# Patient Record
Sex: Male | Born: 1940 | Race: White | Hispanic: No | Smoking: Former smoker
Health system: Southern US, Community
[De-identification: ages and names within clinical notes are randomized; demographics above are authoritative.]

## PROBLEM LIST (undated history)

## (undated) DIAGNOSIS — K635 Polyp of colon: Secondary | ICD-10-CM

## (undated) DIAGNOSIS — H9190 Unspecified hearing loss, unspecified ear: Secondary | ICD-10-CM

## (undated) DIAGNOSIS — E119 Type 2 diabetes mellitus without complications: Secondary | ICD-10-CM

## (undated) DIAGNOSIS — C801 Malignant (primary) neoplasm, unspecified: Secondary | ICD-10-CM

## (undated) HISTORY — DX: Malignant (primary) neoplasm, unspecified: C80.1

## (undated) HISTORY — DX: Type 2 diabetes mellitus without complications: E11.9

## (undated) HISTORY — PX: CATARACT EXTRACTION: SUR2

## (undated) HISTORY — PX: NO PAST SURGERIES: SHX2092

## (undated) HISTORY — DX: Polyp of colon: K63.5

## (undated) HISTORY — DX: Unspecified hearing loss, unspecified ear: H91.90

---

## 2011-10-29 HISTORY — PX: COLONOSCOPY: SHX174

## 2015-02-22 ENCOUNTER — Other Ambulatory Visit: Payer: Self-pay | Admitting: General Surgery

## 2015-02-22 ENCOUNTER — Encounter: Payer: Self-pay | Admitting: General Surgery

## 2015-02-22 ENCOUNTER — Ambulatory Visit (INDEPENDENT_AMBULATORY_CARE_PROVIDER_SITE_OTHER): Payer: Medicare Other | Admitting: General Surgery

## 2015-02-22 VITALS — BP 132/62 | HR 80 | Resp 16 | Ht 72.0 in | Wt 182.0 lb

## 2015-02-22 DIAGNOSIS — C20 Malignant neoplasm of rectum: Secondary | ICD-10-CM

## 2015-02-22 NOTE — Patient Instructions (Addendum)
PET scan  The patient is aware to call back for any questions or concerns.  Implanted [facility] Guide An implanted port is a type of central line that is placed under the skin. Central lines are used to provide IV access when treatment or nutrition needs to be given through a person's veins. Implanted ports are used for long-term IV access. An implanted port may be placed because:   You need IV medicine that would be irritating to the small veins in your hands or arms.   You need long-term IV medicines, such as antibiotics.   You need IV nutrition for a long period.   You need frequent blood draws for lab tests.   You need dialysis.  Implanted ports are usually placed in the chest area, but they can also be placed in the upper arm, the abdomen, or the leg. An implanted port has two main parts:   Reservoir. The reservoir is round and will appear as a small, raised area under your skin. The reservoir is the part where a needle is inserted to give medicines or draw blood.   Catheter. The catheter is a thin, flexible tube that extends from the reservoir. The catheter is placed into a large vein. Medicine that is inserted into the reservoir goes into the catheter and then into the vein.  HOW WILL I CARE FOR MY INCISION SITE? Do not get the incision site wet. Bathe or shower as directed by your health care provider.  HOW IS MY PORT ACCESSED? Special steps must be taken to access the port:   Before the port is accessed, a numbing cream can be placed on the skin. This helps numb the skin over the port site.   Your health care provider uses a sterile technique to access the port.  Your health care provider must put on a mask and sterile gloves.  The skin over your port is cleaned carefully with an antiseptic and allowed to dry.  The port is gently pinched between sterile gloves, and a needle is inserted into the port.  Only "non-coring" port needles should be used to access the  port. Once the port is accessed, a blood return should be checked. This helps ensure that the port is in the vein and is not clogged.   If your port needs to remain accessed for a constant infusion, a clear (transparent) bandage will be placed over the needle site. The bandage and needle will need to be changed every week, or as directed by your health care provider.   Keep the bandage covering the needle clean and dry. Do not get it wet. Follow your health care provider's instructions on how to take a shower or bath while the port is accessed.   If your port does not need to stay accessed, no bandage is needed over the port.  WHAT IS FLUSHING? Flushing helps keep the port from getting clogged. Follow your health care provider's instructions on how and when to flush the port. Ports are usually flushed with saline solution or a medicine called heparin. The need for flushing will depend on how the port is used.   If the port is used for intermittent medicines or blood draws, the port will need to be flushed:   After medicines have been given.   After blood has been drawn.   As part of routine maintenance.   If a constant infusion is running, the port may not need to be flushed.  HOW LONG WILL  MY PORT STAY IMPLANTED? The port can stay in for as long as your health care provider thinks it is needed. When it is time for the port to come out, surgery will be done to remove it. The procedure is similar to the one performed when the port was put in.  WHEN SHOULD I SEEK IMMEDIATE MEDICAL CARE? When you have an implanted port, you should seek immediate medical care if:   You notice a bad smell coming from the incision site.   You have swelling, redness, or drainage at the incision site.   You have more swelling or pain at the port site or the surrounding area.   You have a fever that is not controlled with medicine. Document Released: 10/14/2005 Document Revised: 08/04/2013 Document  Reviewed: 06/21/2013 [facility] Patient Information 2015 [place], [place]. This information is not intended to replace advice given to you by your health care provider. Make sure you discuss any questions you have with your health care provider.  Patient has been scheduled for a PET scan at [facility] for 02-27-15 at 10:30 am (arrive 10:15 am). Prep instructions were reviewed with the patient.   This patient will be scheduled for an appointment to see medical oncology and Dr. Baruch Gouty at the [facility].

## 2015-02-22 NOTE — Progress Notes (Signed)
Patient ID: Gary Hampton, male   DOB: 1941/09/03, 74 y.o.   MRN: [id number]  Chief Complaint  Patient presents with  . Rectal Bleeding    HPI Gary Hampton is a 74 y.o. male.  Here today for evaluation of rectal bleeding and diarrhea. He states it has gotten worse over the past 6 months. He states he can't control his bowel as well. His bowel use to move every other day but now its several times a day. Described as pudding consistency. He noticed occasional bleeding with his BM over the past several months. He does have hemorrhoids. He has used suppositories but didn't help. He has seen bleeding without a BM.  Denies family history of colon cancer.  When he had his colonoscopy 3 years ago he was having diarrhea described as  "watery". At that time he was told that there was a rectal mass that could be cancer, but he declined evaluation as his diarrhea resolved after the colonoscopy.  Recent weight loss about 20 pounds. Denies abdominal pain.  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   HPI  Past Medical History  Diagnosis Date  . Diabetes mellitus without complication   . Colon polyp   . Hard of hearing     Past Surgical History  Procedure Laterality Date  . Colonoscopy  2013    Dr. Dionne Milo    No family history on file.  Social History History  Substance Use Topics  . Smoking status: Former Smoker -- 3 years    Quit date: 10/28/1978  . Smokeless tobacco: Never Used  . Alcohol Use: 0.0 oz/week    0 Standard drinks or equivalent per week     Comment: 3/day    No Known Allergies  Current Outpatient Prescriptions  Medication Sig Dispense Refill  . glimepiride (AMARYL) 2 MG tablet Take 2 mg by mouth daily with breakfast.     No current facility-administered medications for this visit.    Review of Systems Review of Systems   Constitutional: Negative.   Respiratory: Negative.   Cardiovascular: Negative.   Gastrointestinal: Positive for diarrhea and blood in stool.    Blood pressure 132/62, pulse 80, resp. rate 16, height 6' (1.829 m), weight 182 lb (82.555 kg).  Physical Exam Physical Exam  Constitutional: He is oriented to person, place, and time. He appears well-developed and well-nourished.  Neck: Neck supple.  Cardiovascular: Normal rate, regular rhythm, normal heart sounds and intact distal pulses.   Pulses:      Femoral pulses are 2+ on the right side, and 2+ on the left side.      Dorsalis pedis pulses are 2+ on the right side, and 2+ on the left side.  Pulmonary/Chest: Effort normal and breath sounds normal.  Abdominal: Soft. Normal appearance and bowel sounds are normal. There is no tenderness.  Genitourinary:     Lymphadenopathy:    He has no cervical adenopathy.       Left cervical: No superficial cervical adenopathy present.       Right: No inguinal and no supraclavicular adenopathy present.       Left: No inguinal and no supraclavicular adenopathy present.  Neurological: He is alert and oriented to person, place, and time.  Skin: Skin is warm and dry.    Data Reviewed Laboratory studies dated 02/17/2015 completed by his primary care physician showed a hemoglobin  of 17.9 with an MCV of 94, white blood cell count of 8600. Nonfasting blood sugar 345. Creatinine 0.76. Estimated GFR 90. Hemoglobin A1c 10.6. CEA 29.0.  Colonoscopy report dated 02/11/2012 oriented and normal perianal rectal exam. Polyps were reported in the cecum, ileocecal valve, ascending colon, transverse colon and descending colon. A fungating nonobstructing medium-sized mass was identified in the distal rectum. This was described as being non-circumferential. Biopsy reports from this procedure are not available at this time.  PCP notes of 02/17/2015 reviewed. 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  Location of this lesion is not noted, and formal pathology needs to be reviewed.  Assessment    Rectal mass with bleeding, no anemia, previous clinical impression of rectal cancer.  Elevated CEA consistent with rectal cancer.  Poorly controlled diabetes.  Weight loss.    Plan    The patient's clinical exam is consistent with rectal cancer. Attempts will be made to obtain the biopsy results from his 2013 endoscopy. Based on his elevated CEA and clinical exam, a PET/CT is indicated to assess for distant disease, strongly suspected with his elevated CEA.  The patient was advised that chemotherapy and radiation as first line treatment as part of multimodal therapy. He is likely incontinent due to the mass protruding through the rectum. If metastatic disease is not identified, he may be a candidate for a diverting colostomy to minimize his discomfort with rectal seepage during neoadjuvant therapy.    PET scan.  Appointment with [place].  The role for central venous access for chemotherapy for rectal cancer was discussed. The risks associated with central venous access including arterial, pulmonary and venous injury were reviewed. The possible need for additional treatment if pulmonary injury occurs (chest tube placement) was discussed.  Patient has been scheduled for a PET scan at [facility] for 02-27-15 at 10:30 am (arrive 10:15 am). Prep instructions were reviewed with the patient.   This patient will be scheduled for an appointment to see medical oncology and Dr. Baruch Gouty at the [facility]   PCP:  Irven Easterly 02/22/2015, 7:50 PM

## 2015-02-23 ENCOUNTER — Telehealth: Payer: Self-pay | Admitting: *Deleted

## 2015-02-23 NOTE — Telephone Encounter (Signed)
Patient has been scheduled for an appointment with Dr. Ma Hillock and Dr. Baruch Gouty for 03-03-15 at 9:30 am. Message left on patient's cell number with information per his request.

## 2015-02-27 ENCOUNTER — Ambulatory Visit
Admission: RE | Admit: 2015-02-27 | Discharge: 2015-02-27 | Disposition: A | Discharge status: Home / Self Care | Payer: Medicare Other | Source: Ambulatory Visit | Attending: General Surgery | Admitting: General Surgery

## 2015-02-27 ENCOUNTER — Telehealth: Payer: Self-pay

## 2015-02-27 ENCOUNTER — Telehealth: Payer: Self-pay | Admitting: *Deleted

## 2015-02-27 DIAGNOSIS — Z87891 Personal history of nicotine dependence: Secondary | ICD-10-CM | POA: Diagnosis not present

## 2015-02-27 DIAGNOSIS — R634 Abnormal weight loss: Secondary | ICD-10-CM | POA: Diagnosis not present

## 2015-02-27 DIAGNOSIS — H9190 Unspecified hearing loss, unspecified ear: Secondary | ICD-10-CM | POA: Diagnosis not present

## 2015-02-27 DIAGNOSIS — R197 Diarrhea, unspecified: Secondary | ICD-10-CM | POA: Diagnosis not present

## 2015-02-27 DIAGNOSIS — K625 Hemorrhage of anus and rectum: Secondary | ICD-10-CM | POA: Diagnosis present

## 2015-02-27 DIAGNOSIS — Z79899 Other long term (current) drug therapy: Secondary | ICD-10-CM | POA: Diagnosis not present

## 2015-02-27 DIAGNOSIS — K649 Unspecified hemorrhoids: Secondary | ICD-10-CM | POA: Diagnosis not present

## 2015-02-27 DIAGNOSIS — E119 Type 2 diabetes mellitus without complications: Secondary | ICD-10-CM | POA: Diagnosis not present

## 2015-02-27 DIAGNOSIS — R970 Elevated carcinoembryonic antigen [CEA]: Secondary | ICD-10-CM | POA: Diagnosis not present

## 2015-02-27 DIAGNOSIS — C20 Malignant neoplasm of rectum: Secondary | ICD-10-CM

## 2015-02-27 DIAGNOSIS — D490 Neoplasm of unspecified behavior of digestive system: Secondary | ICD-10-CM | POA: Diagnosis not present

## 2015-02-27 DIAGNOSIS — Z86010 Personal history of colonic polyps: Secondary | ICD-10-CM | POA: Diagnosis not present

## 2015-02-27 LAB — GLUCOSE, CAPILLARY
Comment 1: HIGH
Glucose-Capillary: 284 mg/dL — ABNORMAL HIGH (ref 70–99)
Glucose-Capillary: 305 mg/dL — ABNORMAL HIGH (ref 70–99)

## 2015-02-27 NOTE — Telephone Encounter (Signed)
Suzann called with PET/CT at [facility]. The patient was there today for his PET scan but his blood sugar was 284, and then 305 on check. He sees Dr Benita Stabile for this and was seen 3 weeks ago. He did follow the protocol for the test as instructed. He will need to have his blood sugar under control and be seen by Dr Hall Busing for this. His PET scan will be rescheduled once this is done. The patient will call the office once he has seen Dr Hall Busing and his blood sugar is under control.

## 2015-02-27 NOTE — Telephone Encounter (Signed)
-----   Message from Robert Bellow, MD sent at 02/27/2015 10:31 AM EDT ----- Please reschedule the patient's PET scan. We'll planned would be attempt to observation the day prior for diabetic glucose control.

## 2015-02-27 NOTE — Telephone Encounter (Signed)
Patient's PET scan has been rescheduled for 03-03-15 at [facility] for 7:30 am (arrive 7 am). This patient is aware of prep instructions.   Patient will be admitted to [facility] for observation on 03-02-15 for diabetic glucose control.

## 2015-03-01 ENCOUNTER — Other Ambulatory Visit: Payer: Self-pay | Admitting: General Surgery

## 2015-03-01 DIAGNOSIS — IMO0002 Reserved for concepts with insufficient information to code with codable children: Secondary | ICD-10-CM | POA: Insufficient documentation

## 2015-03-01 DIAGNOSIS — E1165 Type 2 diabetes mellitus with hyperglycemia: Secondary | ICD-10-CM | POA: Insufficient documentation

## 2015-03-02 ENCOUNTER — Ambulatory Visit
Admission: AD | Admit: 2015-03-02 | Discharge: 2015-03-03 | Disposition: A | Discharge status: Home / Self Care | Payer: Medicare Other | Source: Ambulatory Visit | Attending: General Surgery | Admitting: General Surgery

## 2015-03-02 DIAGNOSIS — Z79899 Other long term (current) drug therapy: Secondary | ICD-10-CM | POA: Insufficient documentation

## 2015-03-02 DIAGNOSIS — R7309 Other abnormal glucose: Secondary | ICD-10-CM | POA: Diagnosis present

## 2015-03-02 DIAGNOSIS — H9190 Unspecified hearing loss, unspecified ear: Secondary | ICD-10-CM | POA: Insufficient documentation

## 2015-03-02 DIAGNOSIS — K625 Hemorrhage of anus and rectum: Secondary | ICD-10-CM | POA: Diagnosis not present

## 2015-03-02 DIAGNOSIS — R634 Abnormal weight loss: Secondary | ICD-10-CM | POA: Insufficient documentation

## 2015-03-02 DIAGNOSIS — D490 Neoplasm of unspecified behavior of digestive system: Secondary | ICD-10-CM | POA: Insufficient documentation

## 2015-03-02 DIAGNOSIS — Z86010 Personal history of colonic polyps: Secondary | ICD-10-CM | POA: Insufficient documentation

## 2015-03-02 DIAGNOSIS — K649 Unspecified hemorrhoids: Secondary | ICD-10-CM | POA: Insufficient documentation

## 2015-03-02 DIAGNOSIS — R970 Elevated carcinoembryonic antigen [CEA]: Secondary | ICD-10-CM | POA: Insufficient documentation

## 2015-03-02 DIAGNOSIS — E1165 Type 2 diabetes mellitus with hyperglycemia: Secondary | ICD-10-CM

## 2015-03-02 DIAGNOSIS — C189 Malignant neoplasm of colon, unspecified: Secondary | ICD-10-CM

## 2015-03-02 DIAGNOSIS — E119 Type 2 diabetes mellitus without complications: Secondary | ICD-10-CM | POA: Insufficient documentation

## 2015-03-02 DIAGNOSIS — Z87891 Personal history of nicotine dependence: Secondary | ICD-10-CM | POA: Insufficient documentation

## 2015-03-02 DIAGNOSIS — C20 Malignant neoplasm of rectum: Secondary | ICD-10-CM

## 2015-03-02 DIAGNOSIS — R197 Diarrhea, unspecified: Secondary | ICD-10-CM | POA: Insufficient documentation

## 2015-03-02 DIAGNOSIS — O24911 Unspecified diabetes mellitus in pregnancy, first trimester: Secondary | ICD-10-CM | POA: Diagnosis present

## 2015-03-02 LAB — GLUCOSE, CAPILLARY
GLUCOSE-CAPILLARY: 225 mg/dL — AB (ref 70–99)
Glucose-Capillary: 300 mg/dL — ABNORMAL HIGH (ref 70–99)
Glucose-Capillary: 250 mg/dL — ABNORMAL HIGH (ref 70–99)

## 2015-03-02 MED ORDER — SODIUM CHLORIDE 0.45 % IV SOLN
INTRAVENOUS | Status: DC
  Administered 2015-03-02 – 2015-03-03 (×3): via INTRAVENOUS

## 2015-03-02 MED ORDER — ALUM & MAG HYDROXIDE-SIMETH 200-200-20 MG/5ML PO SUSP: 30.0000 mL | Freq: Four times a day (QID) | ORAL | Status: DC | PRN

## 2015-03-02 MED ORDER — INSULIN ASPART 100 UNIT/ML ~~LOC~~ SOLN
0.0000 [IU] | SUBCUTANEOUS | Status: DC
  Administered 2015-03-02: 5 [IU] via SUBCUTANEOUS
  Administered 2015-03-02: 12:00:00 8 [IU] via SUBCUTANEOUS
  Administered 2015-03-02: 5 [IU] via SUBCUTANEOUS
  Administered 2015-03-03: 8 [IU] via SUBCUTANEOUS
  Administered 2015-03-03: 5 [IU] via SUBCUTANEOUS
  Administered 2015-03-03: 3 [IU] via SUBCUTANEOUS
  Filled 2015-03-02 (×2): qty 5
  Filled 2015-03-02: qty 8
  Filled 2015-03-02: qty 3
  Filled 2015-03-02: qty 8
  Filled 2015-03-02: qty 5

## 2015-03-02 MED ORDER — ACETAMINOPHEN 650 MG RE SUPP: 650.0000 mg | Freq: Four times a day (QID) | RECTAL | Status: DC | PRN

## 2015-03-02 MED ORDER — INSULIN GLARGINE 100 UNIT/ML ~~LOC~~ SOLN
8.0000 [IU] | Freq: Once | SUBCUTANEOUS | Status: AC
  Administered 2015-03-02: 8 [IU] via SUBCUTANEOUS
  Filled 2015-03-02: qty 0.08

## 2015-03-02 MED ORDER — ACETAMINOPHEN 325 MG PO TABS: 650.0000 mg | ORAL_TABLET | ORAL | Status: DC | PRN

## 2015-03-02 NOTE — Plan of Care (Signed)
Problem: Discharge Progression Outcomes Goal: Discharge plan in place and appropriate Outcome: Progressing Schedulded for PET scan tomorrow, receiving insulin to control hyperglycemia, carb controlled diet ordered, pt educated on importance of maintaining fsbs below 200 for PET scan in the morning.

## 2015-03-02 NOTE — Progress Notes (Signed)
The patient is a poorly controlled diabetic. In consultation with his primary care provider there was no believe that he will I see me could be achieved. The patient reports diarrhea with all oral agents, and this precludes tight glucose control with these medications.  With the markedly elevated CEA at 39, strongly indicative of metastatic colon cancer, early PET scan to help determine treatment of care is felt to be very important. The patient is being admitted for IV hydration and insulin therapy to lower his blood sugar to permit PET scanning.

## 2015-03-02 NOTE — H&P (Signed)
Gary Hampton is a 74 y.o. male. Here today for evaluation of rectal bleeding and diarrhea. He states it has gotten worse over the past 6 months. He states he can't control his bowel as well. His bowel use to move every other day but now its several times a day. Described as pudding consistency. He noticed occasional bleeding with his BM over the past several months. He does have hemorrhoids. He has used suppositories but didn't help. He has seen bleeding without a BM.  Denies family history of colon cancer.  When he had his colonoscopy 3 years ago he was having diarrhea described as "watery". At that time he was told that there was a rectal mass that could be cancer, but he declined evaluation as his diarrhea resolved after the colonoscopy.  Recent weight loss about 20 pounds. Denies abdominal pain.  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   HPI  Past Medical History  Diagnosis Date  . Diabetes mellitus without complication   . Colon polyp   . Hard of hearing     Past Surgical History  Procedure Laterality Date  . Colonoscopy  2013    Dr. Dionne Milo    No family history on file.  Social History History  Substance Use Topics  . Smoking status: Former Smoker -- 3 years    Quit date: 10/28/1978  . Smokeless tobacco: Never Used  . Alcohol Use: 0.0 oz/week    0 Standard drinks or equivalent per week     Comment: 3/day    No Known Allergies  Current Outpatient Prescriptions  Medication Sig Dispense Refill  . glimepiride (AMARYL) 2 MG tablet Take 2 mg by mouth daily with breakfast.     No current facility-administered medications for this visit.    Review of Systems Review of Systems  Constitutional: Negative.  Respiratory: Negative.  Cardiovascular: Negative.    Gastrointestinal: Positive for diarrhea and blood in stool.    Blood pressure 132/62, pulse 80, resp. rate 16, height 6' (1.829 m), weight 182 lb (82.555 kg).  Physical Exam Physical Exam  Constitutional: He is oriented to person, place, and time. He appears well-developed and well-nourished.  Neck: Neck supple.  Cardiovascular: Normal rate, regular rhythm, normal heart sounds and intact distal pulses.  Pulses:  Femoral pulses are 2+ on the right side, and 2+ on the left side.  Dorsalis pedis pulses are 2+ on the right side, and 2+ on the left side.  Pulmonary/Chest: Effort normal and breath sounds normal.  Abdominal: Soft. Normal appearance and bowel sounds are normal. There is no tenderness.  Genitourinary:     Lymphadenopathy:   He has no cervical adenopathy.   Left cervical: No superficial cervical adenopathy present.   Right: No inguinal and no supraclavicular adenopathy present.   Left: No inguinal and no supraclavicular adenopathy present.  Neurological: He is alert and oriented to person, place, and time.  Skin: Skin is warm and dry.    Data Reviewed Laboratory studies dated 02/17/2015 completed by his primary care physician showed a hemoglobin of 17.9 with an MCV of 94, white blood cell count of 8600. Nonfasting blood sugar 345. Creatinine 0.76. Estimated GFR 90. Hemoglobin A1c 10.6. CEA 29.0.  Colonoscopy report dated 02/11/2012 oriented and normal perianal rectal exam. Polyps were reported in the cecum, ileocecal valve, ascending colon, transverse colon and descending colon. A fungating nonobstructing  medium-sized mass was identified in the distal rectum. This was described as being non-circumferential. Biopsy reports from this procedure are not available at this time.  PCP notes of 02/17/2015 reviewed. 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 Location of this lesion is not noted, and formal pathology needs to be  reviewed.  Assessment    Rectal mass with bleeding, no anemia, previous clinical impression of rectal cancer.  Elevated CEA consistent with rectal cancer.  Poorly controlled diabetes.  Weight loss.    Plan    The patient's clinical exam is consistent with rectal cancer. Attempts will be made to obtain the biopsy results from his 2013 endoscopy. Based on his elevated CEA and clinical exam, a PET/CT is indicated to assess for distant disease, strongly suspected with his elevated CEA.  The patient was advised that chemotherapy and radiation as first line treatment as part of multimodal therapy. He is likely incontinent due to the mass protruding through the rectum. If metastatic disease is not identified, he may be a candidate for a diverting colostomy to minimize his discomfort with rectal seepage during neoadjuvant therapy.    PET scan.  Appointment with [place].  The role for central venous access for chemotherapy for rectal cancer was discussed. The risks associated with central venous access including arterial, pulmonary and venous injury were reviewed. The possible need for additional treatment if pulmonary injury occurs (chest tube placement) was discussed.  Patient was originally scheduled for a PET scan at [facility] for 02-27-15 at 10:30 am , but his BS was 300.  Observation for insulin administration for glucose control.

## 2015-03-03 ENCOUNTER — Ambulatory Visit: Admission: RE | Admit: 2015-03-03 | Payer: Medicare Other | Source: Ambulatory Visit

## 2015-03-03 ENCOUNTER — Encounter: Payer: Self-pay | Admitting: Radiation Oncology

## 2015-03-03 ENCOUNTER — Ambulatory Visit: Payer: Medicare Other

## 2015-03-03 ENCOUNTER — Inpatient Hospital Stay: Payer: Medicare Other | Attending: Internal Medicine | Admitting: Internal Medicine

## 2015-03-03 ENCOUNTER — Ambulatory Visit
Admission: RE | Admit: 2015-03-03 | Discharge: 2015-03-03 | Disposition: A | Discharge status: Home / Self Care | Payer: Medicare Other | Source: Ambulatory Visit | Attending: Radiation Oncology | Admitting: Radiation Oncology

## 2015-03-03 VITALS — BP 171/83 | HR 93 | Temp 96.4°F | Resp 22

## 2015-03-03 DIAGNOSIS — K625 Hemorrhage of anus and rectum: Secondary | ICD-10-CM | POA: Diagnosis not present

## 2015-03-03 DIAGNOSIS — E1165 Type 2 diabetes mellitus with hyperglycemia: Secondary | ICD-10-CM | POA: Diagnosis not present

## 2015-03-03 DIAGNOSIS — C20 Malignant neoplasm of rectum: Secondary | ICD-10-CM | POA: Insufficient documentation

## 2015-03-03 DIAGNOSIS — F17210 Nicotine dependence, cigarettes, uncomplicated: Secondary | ICD-10-CM | POA: Insufficient documentation

## 2015-03-03 DIAGNOSIS — R1900 Intra-abdominal and pelvic swelling, mass and lump, unspecified site: Secondary | ICD-10-CM | POA: Insufficient documentation

## 2015-03-03 DIAGNOSIS — Z510 Encounter for antineoplastic radiation therapy: Secondary | ICD-10-CM | POA: Insufficient documentation

## 2015-03-03 DIAGNOSIS — R1909 Other intra-abdominal and pelvic swelling, mass and lump: Secondary | ICD-10-CM | POA: Insufficient documentation

## 2015-03-03 DIAGNOSIS — R591 Generalized enlarged lymph nodes: Secondary | ICD-10-CM | POA: Insufficient documentation

## 2015-03-03 DIAGNOSIS — Z79899 Other long term (current) drug therapy: Secondary | ICD-10-CM | POA: Insufficient documentation

## 2015-03-03 DIAGNOSIS — R197 Diarrhea, unspecified: Secondary | ICD-10-CM | POA: Insufficient documentation

## 2015-03-03 DIAGNOSIS — E119 Type 2 diabetes mellitus without complications: Secondary | ICD-10-CM | POA: Insufficient documentation

## 2015-03-03 DIAGNOSIS — R51 Headache: Secondary | ICD-10-CM | POA: Insufficient documentation

## 2015-03-03 LAB — GLUCOSE, CAPILLARY
GLUCOSE-CAPILLARY: 127 mg/dL — AB (ref 70–99)
GLUCOSE-CAPILLARY: 180 mg/dL — AB (ref 70–99)
GLUCOSE-CAPILLARY: 231 mg/dL — AB (ref 70–99)
GLUCOSE-CAPILLARY: 287 mg/dL — AB (ref 70–99)
Glucose-Capillary: 111 mg/dL — ABNORMAL HIGH (ref 70–99)

## 2015-03-03 MED ORDER — FLUDEOXYGLUCOSE F - 18 (FDG) INJECTION
12.4600 | Freq: Once | INTRAVENOUS | Status: AC | PRN
  Administered 2015-03-03: 12.46 via INTRAVENOUS

## 2015-03-03 NOTE — Progress Notes (Signed)
Blood sugar down for PET today. Will arrange OP f/u to review PET and begin treatment planning.

## 2015-03-03 NOTE — Progress Notes (Signed)
Pt is alert and oriented x 4, denies pain, up interdependently, FSBS remains elevated, insulin coverage giving with meals, good appetite, PET scan performed, pt is d/c to home with no new RX and no appointments, items at bedside returned to patient, pt d/c to outpatient cancer center for follow up, Dr. Terri Piedra will contact patient at home concerning PET scan results and oncology appointment. D/c instructions provided to patient.

## 2015-03-03 NOTE — Discharge Instructions (Signed)
Resume your normal diabetic diet and oral agent for blood sugar control. You will be contacted for a follow up appointment with Dr. Bary Castilla after the PET/CT results are available.

## 2015-03-03 NOTE — Plan of Care (Signed)
Problem: Discharge Progression Outcomes Goal: Discharge plan in place and appropriate Individualization  Pt prefers to be called Gary Hampton  Hx of DM, rectal cancer, hearing loss controlled by home medications  Pt is independent and understand how to use phone when he has needs Goal: Other Discharge Outcomes/Goals -Pt is scheduled for PET scan today -Pt education importance of maintaining blood sugars below 200 for PET scan -Blood sugars under control via insulin trending down (225, 231, 111) -No c/o pain  -No fall or injury this shift

## 2015-03-03 NOTE — Consult Note (Signed)
Radiation Oncology NEW PATIENT EVALUATION  Name: Gary Hampton  MRN: [id number]  Date:   03/03/2015     DOB: 10/07/1941   This 74 y.o. male patient presents to the clinic for initial evaluation of Rectal cancer as yet not staged.  REFERRING PHYSICIAN: Albina Billet, MD  CHIEF COMPLAINT:  Chief Complaint  Patient presents with  . Rectal Cancer    Initial consult for rectal cancer    DIAGNOSIS: The encounter diagnosis was Rectal cancer.   PREVIOUS INVESTIGATIONS:  Pathology Report, Imaging, Referral Report and Lab Reports PET scan reviewed pathology reviewed clinical notes reviewed  HPI: Patient is a 74 year old male with comorbidities including hypertension adult onset diabetes. His history dates back 3 years prior when he presented with diarrhea and rectal bleeding found have a large rectal mass biopsy that time only confirmed high-grade dysplasia. His CEA was normal. Patient disappeared from follow-up. Recently reevaluated with increasing diarrhea and blood per stools a 20 pound weight loss. Patient underwent colonoscopy on 02/11/2012 showing a fungating medium size mass in the distal rectum. From this procedure was the diagnosis of high-grade dysplasia. Patient had a PET CT scan today showing hypermetabolic activity in the right colon as well as hypermetabolic activity in the rectum consistent with known rectal mass. He also has some hydronephrosis bilaterally on my review official report of PET scan is not been issued. At this time patient is sent to both medical oncology and radiation oncology for opinion. Plan is to proceed with biopsy and port placement at the same time by surgeon. No endoscopic ultrasound has been performed at this time.  PLANNED TREATMENT REGIMEN: Evaluation by medical oncology and review of PET CT scan and presentation at our weekly tumor conference  PAST MEDICAL HISTORY:  has a past medical history of Diabetes mellitus without complication; Colon polyp; Hard of  hearing; and Cancer.    PAST SURGICAL HISTORY:  Past Surgical History  Procedure Laterality Date  . Colonoscopy  2013    Dr. Dionne Milo    FAMILY HISTORY: family history is not on file.  SOCIAL HISTORY:  reports that he quit smoking about 36 years ago. He has never used smokeless tobacco. He reports that he drinks alcohol. He reports that he does not use illicit drugs.  ALLERGIES: Review of patient's allergies indicates no known allergies.  MEDICATIONS:  No current facility-administered medications for this encounter.   No current outpatient prescriptions on file.   Facility-Administered Medications Ordered in Other Encounters  Medication Dose Route Frequency Provider Last Rate Last Dose  . 0.45 % sodium chloride infusion   Intravenous Continuous Robert Bellow, MD 125 mL/hr at 03/03/15 0326    . acetaminophen (TYLENOL) tablet 650 mg  650 mg Oral Q4H PRN Robert Bellow, MD       Or  . acetaminophen (TYLENOL) suppository 650 mg  650 mg Rectal Q6H PRN Robert Bellow, MD      . alum & mag hydroxide-simeth (MAALOX/MYLANTA) 200-200-20 MG/5ML suspension 30 mL  30 mL Oral Q6H PRN Robert Bellow, MD      . insulin aspart (novoLOG) injection 0-15 Units  0-15 Units Subcutaneous 6 times per day Robert Bellow, MD   3 Units at 03/03/15 1043    ECOG PERFORMANCE STATUS:  1 - Symptomatic but completely ambulatory  REVIEW OF SYSTEMS: Except for watery stools 20 pound weight loss Patient denies any weight loss, fatigue, weakness, fever, chills or night sweats. Patient denies any loss of vision, blurred vision.  Patient denies any ringing  of the ears or hearing loss. No irregular heartbeat. Patient denies heart murmur or history of fainting. Patient denies any chest pain or pain radiating to her upper extremities. Patient denies any shortness of breath, difficulty breathing at night, cough or hemoptysis. Patient denies any swelling in the lower legs. Patient denies any nausea vomiting,  vomiting of blood, or coffee ground material in the vomitus. Patient denies any stomach pain. Patient states has had normal bowel movements no significant constipation or diarrhea. Patient denies any dysuria, hematuria or significant nocturia. Patient denies any problems walking, swelling in the joints or loss of balance. Patient denies any skin changes, loss of hair or loss of weight. Patient denies any excessive worrying or anxiety or significant depression. Patient denies any problems with insomnia. Patient denies excessive thirst, polyuria, polydipsia. Patient denies any swollen glands, patient denies easy bruising or easy bleeding. Patient denies any recent infections, allergies or URI. Patient "s visual fields have not changed significantly in recent time.    PHYSICAL EXAM: BP 171/83 mmHg  Pulse 93  Temp(Src) 96.4 F (35.8 C)  Resp 22 A well-developed male in NAD. Lungs are clear to A&P cardiac examination shows regular rate and rhythm. He has what appears to be protruding mass from his rectum because of bleeding rectal exam was not performed. The mass also may be external hemorrhoids.  Well-developed well-nourished patient in NAD. HEENT reveals PERLA, EOMI, discs not visualized.  Oral cavity is clear. No oral mucosal lesions are identified. Neck is clear without evidence of cervical or supraclavicular adenopathy. Lungs are clear to A&P. Cardiac examination is essentially unremarkable with regular rate and rhythm without murmur rub or thrill. Abdomen is benign with no organomegaly or masses noted. Motor sensory and DTR levels are equal and symmetric in the upper and lower extremities. Cranial nerves II through XII are grossly intact. Proprioception is intact. No peripheral adenopathy or edema is identified. No motor or sensory levels are noted. Crude visual fields are within normal range.   LABORATORY DATA:  Results for orders placed or performed during the hospital encounter of 03/02/15 (from  the past 72 hour(s))  Glucose, capillary     Status: Abnormal   Collection Time: 03/02/15 11:47 AM  Result Value Ref Range   Glucose-Capillary 300 (H) 70 - 99 mg/dL  Glucose, capillary     Status: Abnormal   Collection Time: 03/02/15  4:31 PM  Result Value Ref Range   Glucose-Capillary 250 (H) 70 - 99 mg/dL  Glucose, capillary     Status: Abnormal   Collection Time: 03/02/15  7:47 PM  Result Value Ref Range   Glucose-Capillary 225 (H) 70 - 99 mg/dL   Comment 1 Notify RN   Glucose, capillary     Status: Abnormal   Collection Time: 03/03/15 12:14 AM  Result Value Ref Range   Glucose-Capillary 231 (H) 70 - 99 mg/dL   Comment 1 Notify RN   Glucose, capillary     Status: Abnormal   Collection Time: 03/03/15  3:58 AM  Result Value Ref Range   Glucose-Capillary 111 (H) 70 - 99 mg/dL  Glucose, capillary     Status: Abnormal   Collection Time: 03/03/15  6:02 AM  Result Value Ref Range   Glucose-Capillary 127 (H) 70 - 99 mg/dL   Comment 1 Notify RN   Glucose, capillary     Status: Abnormal   Collection Time: 03/03/15 10:10 AM  Result Value Ref Range   Glucose-Capillary 180 (  H) 70 - 99 mg/dL  Glucose, capillary     Status: Abnormal   Collection Time: 03/03/15 11:14 AM  Result Value Ref Range   Glucose-Capillary 287 (H) 70 - 99 mg/dL   Comment 1 Notify RN      RADIOLOGY RESULTS: Nm Pet Image Initial (pi) Skull Base To Thigh  03/03/2015   CLINICAL DATA:  Initial treatment strategy for rectal cancer.  EXAM: NUCLEAR MEDICINE PET SKULL BASE TO THIGH  TECHNIQUE: 12.5 mCi F-18 FDG was injected intravenously. Full-ring PET imaging was performed from the skull base to thigh after the radiotracer. CT data was obtained and used for attenuation correction and anatomic localization.  FASTING BLOOD GLUCOSE:  Value: 127 mg/dl  COMPARISON:  None.  FINDINGS: NECK  No hypermetabolic lymph nodes in the neck.  CHEST  No hypermetabolic mediastinal or hilar nodes. No suspicious pulmonary nodules on the CT  scan. Emphysematous changes are noted. No acute pulmonary findings. No pleural effusion.  ABDOMEN/PELVIS  Hypermetabolic mass in the ascending colon near the hepatic flexure with SUV max of 19.0. On the CT scan there appears to be a 2.4 cm correlating mass. There is also a benign-appearing lipoma slightly more cephalad. Recommend correlation with recent colonoscopy.  There is also a hypermetabolic rectal mass with SUV max of 10.2. This correlates with the recent colonoscopy. There are small perirectal lymph nodes noted. The largest measures 8 mm on image number 254. This is weakly hypermetabolic at 1.0 SUV max. Very small scattered retroperitoneal lymph nodes are also noted which are not hypermetabolic. No liver lesions to suggest hepatic metastatic disease.  Borderline splenomegaly  No inguinal mass or adenopathy  SKELETON  No focal hypermetabolic activity to suggest skeletal metastasis.  IMPRESSION: 1. Hypermetabolic mass in the ascending colon consistent with neoplasm. 2. Hypermetabolic rectal mass consistent with neoplasm. There are small scattered perirectal lymph node without hypermetabolism but certainly suspicious for lymph node involvement. No enlarged or hypermetabolic nodes in the sigmoid mesocolon or retroperitoneum. Small scattered retroperitoneal lymph nodes are indeterminate but likely benign. 3. No findings for metastatic disease involving the neck or chest.   Electronically Signed   By: Marijo Sanes M.D.   On: 03/03/2015 10:13    IMPRESSION: Locally advanced rectal cancer awaiting biopsy and multimodality treatment planning  PLAN: I discussed the case personally with the surgeon. Agree we need to rebiopsy his rectal mass to determine if actual histology of his probable rectal cancer. Patient will be seeing medical oncology today for opinion also. Certainly concerned about also mass in the right colon which certainly may be a again a colon cancer which needs to be addressed. Also of concern his  bilateral hydronephrosis. Patient needs repeat biopsy and staging with probable endoscopic ultrasound. Would agree with port placement and a my review he has locally advanced rectal cancer which would require preoperative chemoradiation therapy prior to resection. We will present his case at our weekly tumor conference. I have briefly explained the risks and benefits of radiation therapy and side effects.  I would like to take this opportunity for allowing me to participate in the care of your patient.Armstead Peaks., MD

## 2015-03-06 MED ORDER — IPRATROPIUM-ALBUTEROL 0.5-2.5 (3) MG/3ML IN SOLN
RESPIRATORY_TRACT | Status: AC
  Filled 2015-03-06: qty 3

## 2015-03-07 ENCOUNTER — Other Ambulatory Visit: Payer: Self-pay | Admitting: General Surgery

## 2015-03-07 DIAGNOSIS — C20 Malignant neoplasm of rectum: Secondary | ICD-10-CM

## 2015-03-07 NOTE — H&P (Addendum)
Gary Hampton is a 74 y.o. male. Here today for evaluation of rectal bleeding and diarrhea. He states it has gotten worse over the past 6 months. He states he can't control his bowel as well. His bowel use to move every other day but now its several times a day. Described as pudding consistency. He noticed occasional bleeding with his BM over the past several months. He does have hemorrhoids. He has used suppositories but didn't help. He has seen bleeding without a BM.  Denies family history of colon cancer.  When he had his colonoscopy 3 years ago he was having diarrhea described as "watery". At that time he was told that there was a rectal mass that could be cancer, but he declined evaluation as his diarrhea resolved after the colonoscopy.  Recent weight loss about 20 pounds. Denies abdominal pain.  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   HPI  Past Medical History  Diagnosis Date  . Diabetes mellitus without complication   . Colon polyp   . Hard of hearing     Past Surgical History  Procedure Laterality Date  . Colonoscopy  2013    Dr. Dionne Milo    No family history on file.  Social History History  Substance Use Topics  . Smoking status: Former Smoker -- 3 years    Quit date: 10/28/1978  . Smokeless tobacco: Never Used  . Alcohol Use: 0.0 oz/week    0 Standard drinks or equivalent per week     Comment: 3/day    No Known Allergies  Current Outpatient Prescriptions  Medication Sig Dispense Refill  . glimepiride (AMARYL) 2 MG tablet Take 2 mg by mouth daily with breakfast.     No current facility-administered medications for this visit.    Review of Systems Review of Systems  Constitutional: Negative.  Respiratory: Negative.  Cardiovascular: Negative.    Gastrointestinal: Positive for diarrhea and blood in stool.    Blood pressure 132/62, pulse 80, resp. rate 16, height 6' (1.829 m), weight 182 lb (82.555 kg).  Physical Exam Physical Exam  Constitutional: He is oriented to person, place, and time. He appears well-developed and well-nourished.  Neck: Neck supple.  Cardiovascular: Normal rate, regular rhythm, normal heart sounds and intact distal pulses.  Pulses:  Femoral pulses are 2+ on the right side, and 2+ on the left side.  Dorsalis pedis pulses are 2+ on the right side, and 2+ on the left side.  Pulmonary/Chest: Effort normal and breath sounds normal.  Abdominal: Soft. Normal appearance and bowel sounds are normal. There is no tenderness.  Genitourinary:     Lymphadenopathy:   He has no cervical adenopathy.   Left cervical: No superficial cervical adenopathy present.   Right: No inguinal and no supraclavicular adenopathy present.   Left: No inguinal and no supraclavicular adenopathy present.  Neurological: He is alert and oriented to person, place, and time.  Skin: Skin is warm and dry.    Data Reviewed Laboratory studies dated 02/17/2015 completed by his primary care physician showed a hemoglobin of 17.9 with an MCV of 94, white blood cell count of 8600. Nonfasting blood sugar 345. Creatinine 0.76. Estimated GFR 90. Hemoglobin A1c 10.6. CEA 29.0.  Colonoscopy report dated 02/11/2012 oriented and normal perianal rectal exam. Polyps were reported in the cecum, ileocecal valve, ascending colon, transverse colon and descending colon. A fungating nonobstructing  medium-sized mass was identified in the distal rectum. This was described as being non-circumferential. Biopsy reports from this procedure are not available at this time.  PCP notes of 02/17/2015 reviewed. 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 Location of this lesion is not noted, and formal pathology needs to be  reviewed.  Assessment    Rectal mass with bleeding, no anemia, previous clinical impression of rectal cancer.  Elevated CEA consistent with rectal cancer.  Poorly controlled diabetes.  Weight loss.    Plan    The patient's clinical exam is consistent with rectal cancer. Attempts will be made to obtain the biopsy results from his 2013 endoscopy. Based on his elevated CEA and clinical exam, a PET/CT is indicated to assess for distant disease, strongly suspected with his elevated CEA.  The patient was advised that chemotherapy and radiation as first line treatment as part of multimodal therapy. He is likely incontinent due to the mass protruding through the rectum. If metastatic disease is not identified, he may be a candidate for a diverting colostomy to minimize his discomfort with rectal seepage during neoadjuvant therapy.    PET scan.  Appointment with [facility].  The role for central venous access for chemotherapy for rectal cancer was discussed. The risks associated with central venous access including arterial, pulmonary and venous injury were reviewed. The possible need for additional treatment if pulmonary injury occurs (chest tube placement) was discussed.   Recent completed PET/CT of 03/03/2015 shows increased activity in the rectum as well as the descending colon. Colonoscopy from 3 years ago showed a tubulovillous polyp at both locations, high-grade dysplasia noted in the rectal lesion. No evidence of metastatic disease. Robert Bellow 02/22/2015, 7:50 PM

## 2015-03-08 ENCOUNTER — Inpatient Hospital Stay: Payer: Medicare Other

## 2015-03-08 ENCOUNTER — Other Ambulatory Visit: Payer: Self-pay | Admitting: Family Medicine

## 2015-03-08 ENCOUNTER — Other Ambulatory Visit: Payer: Self-pay

## 2015-03-08 ENCOUNTER — Encounter
Admission: RE | Admit: 2015-03-08 | Discharge: 2015-03-08 | Disposition: A | Discharge status: Home / Self Care | Payer: Medicare Other | Source: Ambulatory Visit | Attending: General Surgery | Admitting: General Surgery

## 2015-03-08 ENCOUNTER — Inpatient Hospital Stay (HOSPITAL_BASED_OUTPATIENT_CLINIC_OR_DEPARTMENT_OTHER): Payer: Medicare Other | Admitting: Internal Medicine

## 2015-03-08 ENCOUNTER — Other Ambulatory Visit: Payer: Self-pay | Admitting: Internal Medicine

## 2015-03-08 VITALS — BP 168/96 | HR 97 | Temp 96.6°F | Ht 72.0 in | Wt 184.7 lb

## 2015-03-08 DIAGNOSIS — R591 Generalized enlarged lymph nodes: Secondary | ICD-10-CM | POA: Diagnosis not present

## 2015-03-08 DIAGNOSIS — K649 Unspecified hemorrhoids: Secondary | ICD-10-CM | POA: Diagnosis not present

## 2015-03-08 DIAGNOSIS — C20 Malignant neoplasm of rectum: Secondary | ICD-10-CM

## 2015-03-08 DIAGNOSIS — R197 Diarrhea, unspecified: Secondary | ICD-10-CM

## 2015-03-08 DIAGNOSIS — Z79899 Other long term (current) drug therapy: Secondary | ICD-10-CM

## 2015-03-08 DIAGNOSIS — F17210 Nicotine dependence, cigarettes, uncomplicated: Secondary | ICD-10-CM | POA: Diagnosis not present

## 2015-03-08 DIAGNOSIS — R1900 Intra-abdominal and pelvic swelling, mass and lump, unspecified site: Secondary | ICD-10-CM | POA: Diagnosis not present

## 2015-03-08 DIAGNOSIS — R1909 Other intra-abdominal and pelvic swelling, mass and lump: Secondary | ICD-10-CM

## 2015-03-08 DIAGNOSIS — R634 Abnormal weight loss: Secondary | ICD-10-CM | POA: Diagnosis not present

## 2015-03-08 DIAGNOSIS — H9190 Unspecified hearing loss, unspecified ear: Secondary | ICD-10-CM | POA: Diagnosis not present

## 2015-03-08 DIAGNOSIS — Z87891 Personal history of nicotine dependence: Secondary | ICD-10-CM | POA: Diagnosis not present

## 2015-03-08 DIAGNOSIS — K625 Hemorrhage of anus and rectum: Secondary | ICD-10-CM

## 2015-03-08 DIAGNOSIS — R51 Headache: Secondary | ICD-10-CM | POA: Diagnosis not present

## 2015-03-08 DIAGNOSIS — Z86010 Personal history of colonic polyps: Secondary | ICD-10-CM | POA: Diagnosis not present

## 2015-03-08 DIAGNOSIS — E119 Type 2 diabetes mellitus without complications: Secondary | ICD-10-CM | POA: Diagnosis not present

## 2015-03-08 LAB — COMPREHENSIVE METABOLIC PANEL
ALT: 13 U/L — AB (ref 17–63)
AST: 15 U/L (ref 15–41)
Albumin: 3.6 g/dL (ref 3.5–5.0)
Alkaline Phosphatase: 63 U/L (ref 38–126)
Anion gap: 3 — ABNORMAL LOW (ref 5–15)
BILIRUBIN TOTAL: 0.8 mg/dL (ref 0.3–1.2)
BUN: 17 mg/dL (ref 6–20)
CALCIUM: 8.2 mg/dL — AB (ref 8.9–10.3)
CHLORIDE: 99 mmol/L — AB (ref 101–111)
CO2: 30 mmol/L (ref 22–32)
Creatinine, Ser: 0.92 mg/dL (ref 0.61–1.24)
GFR calc non Af Amer: >60 mL/min (ref >60)
GLUCOSE: 326 mg/dL — AB (ref 65–99)
Potassium: 4.2 mmol/L (ref 3.5–5.1)
SODIUM: 132 mmol/L — AB (ref 135–145)
Total Protein: 6.5 g/dL (ref 6.5–8.1)

## 2015-03-08 LAB — CBC WITH DIFFERENTIAL/PLATELET
BASOS ABS: 0.1 10*3/uL (ref 0–0.1)
BASOS PCT: 1 %
Eosinophils Absolute: 0.3 10*3/uL (ref 0–0.7)
Eosinophils Relative: 4 %
HEMATOCRIT: 48.2 % (ref 40.0–52.0)
HEMOGLOBIN: 16.4 g/dL (ref 13.0–18.0)
LYMPHS ABS: 1.5 10*3/uL (ref 1.0–3.6)
LYMPHS PCT: 18 %
MCH: 31.3 pg (ref 26.0–34.0)
MCHC: 33.9 g/dL (ref 32.0–36.0)
MCV: 92.1 fL (ref 80.0–100.0)
MONO ABS: 0.6 10*3/uL (ref 0.2–1.0)
Monocytes Relative: 8 %
Neutro Abs: 5.6 10*3/uL (ref 1.4–6.5)
Neutrophils Relative %: 69 %
Platelets: 207 10*3/uL (ref 150–440)
RBC: 5.23 MIL/uL (ref 4.40–5.90)
RDW: 13.6 % (ref 11.5–14.5)
WBC: 8.1 10*3/uL (ref 3.8–10.6)

## 2015-03-08 NOTE — Progress Notes (Signed)
[place]  [phone] [phone]     ID: Gary Hampton OB: 07-31-1941  MR#: [id number]  KKX#:[id number]  REFERRING MD: Dr. Hervey Ard  CHIEF COMPLAINT:  1. PET-positive Progressive Rectal Mass now protruding outside of the anus (colonoscopy 2013 had reported tubulovillous adenoma with high-grade dysplasia. Patient opted for no treatment at that time). 2. PET-positive Right Colon Mass  (colonoscopy 2013 had reported tubulovillous adenoma without high-grade dysplasia).  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 Patient was reportedly advised surgical resection of the rectal mass at that time but states that he did not go through with it since his diarrhea got better after colonoscopy. More recently he has noticed significant recurrent pain along with bright red blood in stools. Also states that he has noticed a mass protruding through the anus. He has had further evaluation with a PET scan which reports intensely PET-positive rectal mass, small lymphadenopathy around the rectal mass which is not PET avid, intensely PET-positive ascending colon mass suspicious for colon cancer. Patient states that his appetite and weight have been steady. He denies any new cough chest pain dyspnea or hemoptysis. No new bone pains. States that he is physically active and cares for himself since he lives alone.   REVIEW OF SYSTEMS:   Review of Systems  Constitutional: Negative.   Respiratory: Negative.   Cardiovascular: Negative.   Gastrointestinal: Positive for diarrhea and blood in stool.  Genitourinary: Negative.   Musculoskeletal: Negative.   Skin: Negative.   Neurological: Positive  for headaches (MIGRAINES).  Endo/Heme/Allergies: Negative.     As per HPI. Otherwise, a complete review of systems is negatve.  PAST MEDICAL HISTORY: Past Medical History  Diagnosis Date  . Diabetes mellitus without complication   . Colon polyp   . Hard of hearing   . Cancer     Rectal    PAST SURGICAL HISTORY: Past Surgical History  Procedure Laterality Date  . Colonoscopy  2013    Dr. Dionne Milo  . Cataract extraction Right   . No past surgeries      FAMILY HISTORY Remarkable for breast cancer, melanoma. Denies GI malignancies.  HEALTH MAINTENANCE: History  Substance Use Topics  . Smoking status: Former Smoker -- 3 years    Quit date: 10/28/1978  . Smokeless tobacco: Never Used  . Alcohol Use: 0.0 oz/week    0 Standard drinks or equivalent per week     Comment: 3/day     Colonoscopy:  PAP:  Bone density:  Lipid panel:  No Known Allergies  Current Outpatient Prescriptions  Medication Sig Dispense Refill  . glimepiride (AMARYL) 2 MG tablet Take 2 mg by mouth daily with breakfast.     No current facility-administered medications for this visit.    OBJECTIVE: Filed Vitals:   03/08/15 1100  BP: 168/96  Pulse: 97  Temp: 96.6 F (35.9 C)     Body mass index is 25.05 kg/(m^2).    ECOG FS:1 - Symptomatic but completely ambulatory  General: patient is moderately built and nourished individual, alert and oriented, in no acute distress. Eyes: Pink conjunctiva, anicteric sclera. HEENT: Normocephalic, moist mucous membranes, clear oropharnyx. Lungs: Clear to auscultation bilaterally. Heart: Regular rate and rhythm. No rubs, murmurs, or gallops. Abdomen: Soft, nontender, nondistended. No  organomegaly noted, normoactive bowel sounds. Rectal area inspection shows protruding mass which appears vascular in some areas. Musculoskeletal: No edema, cyanosis, or clubbing. Neuro: Alert, answering all questions appropriately. Cranial nerves grossly intact. Skin: No rashes  or petechiae noted. Psych: Normal affect. Lymphatics: No axillary or inguinal LAD.   LAB RESULTS:     Component Value Date/Time   NA 132* 03/08/2015 1239   K 4.2 03/08/2015 1239   CL 99* 03/08/2015 1239   CO2 30 03/08/2015 1239   GLUCOSE 326* 03/08/2015 1239   BUN 17 03/08/2015 1239   CREATININE 0.92 03/08/2015 1239   CALCIUM 8.2* 03/08/2015 1239   PROT 6.5 03/08/2015 1239   ALBUMIN 3.6 03/08/2015 1239   AST 15 03/08/2015 1239   ALT 13* 03/08/2015 1239   ALKPHOS 63 03/08/2015 1239   BILITOT 0.8 03/08/2015 1239   GFRNONAA >60 03/08/2015 1239   GFRAA >60 03/08/2015 1239    No results found for: SPEP, UPEP  Lab Results  Component Value Date   WBC 8.1 03/08/2015   NEUTROABS 5.6 03/08/2015   HGB 16.4 03/08/2015   HCT 48.2 03/08/2015   MCV 92.1 03/08/2015   PLT 207 03/08/2015      Chemistry      Component Value Date/Time   NA 132* 03/08/2015 1239   K 4.2 03/08/2015 1239   CL 99* 03/08/2015 1239   CO2 30 03/08/2015 1239   BUN 17 03/08/2015 1239   CREATININE 0.92 03/08/2015 1239      Component Value Date/Time   CALCIUM 8.2* 03/08/2015 1239   ALKPHOS 63 03/08/2015 1239   AST 15 03/08/2015 1239   ALT 13* 03/08/2015 1239   BILITOT 0.8 03/08/2015 1239       STUDIES: Nm Pet Image Initial (pi) Skull Base To Thigh  03/03/2015   CLINICAL DATA:  Initial treatment strategy for rectal cancer.  EXAM: NUCLEAR MEDICINE PET SKULL BASE TO THIGH  TECHNIQUE: 12.5 mCi F-18 FDG was injected intravenously. Full-ring PET imaging was performed from the skull base to thigh after the radiotracer. CT data was obtained and used for attenuation correction and anatomic localization.  FASTING BLOOD GLUCOSE:  Value: 127 mg/dl  COMPARISON:  None.  FINDINGS: NECK  No hypermetabolic lymph nodes in the neck.  CHEST  No hypermetabolic mediastinal or hilar nodes. No suspicious pulmonary nodules on the CT scan. Emphysematous changes are noted. No acute pulmonary findings. No pleural effusion.   ABDOMEN/PELVIS  Hypermetabolic mass in the ascending colon near the hepatic flexure with SUV max of 19.0. On the CT scan there appears to be a 2.4 cm correlating mass. There is also a benign-appearing lipoma slightly more cephalad. Recommend correlation with recent colonoscopy.  There is also a hypermetabolic rectal mass with SUV max of 10.2. This correlates with the recent colonoscopy. There are small perirectal lymph nodes noted. The largest measures 8 mm on image number 254. This is weakly hypermetabolic at 1.0 SUV max. Very small scattered retroperitoneal lymph nodes are also noted which are not hypermetabolic. No liver lesions to suggest hepatic metastatic disease.  Borderline splenomegaly  No inguinal mass or adenopathy  SKELETON  No focal hypermetabolic activity to suggest skeletal metastasis.  IMPRESSION: 1. Hypermetabolic mass in the ascending colon consistent with neoplasm. 2. Hypermetabolic rectal mass consistent with neoplasm. There are small scattered perirectal lymph node without hypermetabolism but certainly suspicious for lymph node involvement. No enlarged or hypermetabolic nodes in the sigmoid mesocolon or retroperitoneum. Small scattered retroperitoneal lymph nodes are indeterminate but likely benign.  3. No findings for metastatic disease involving the neck or chest.   Electronically Signed   By: Marijo Sanes M.D.   On: 03/03/2015 10:13    ASSESSMENT/PLAN:   1. PET-positive Progressive Rectal Mass now protruding outside of the anus (colonoscopy 2013 had reported tubulovillous adenoma with high-grade dysplasia. Patient opted for no treatment at that time). 2. PET-positive Right Colon Mass now protruding outside of the anus (colonoscopy 2013 had reported tubulovillous adenoma without high-grade dysplasia. Patient opted for no treatment at that time). Have reviewed records sent sent by the referring physician. Have independently reviewed PET scan and discussed findings with patient. 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 Patient states that he was told in 2013 that he needs surgical resection of these masses but he has been waiting and watching, at this time he wants to pursue aggressive workup and treatment as needed. Have explained that there is some lymphadenopathy reported around the rectal mass which is not PET avid, and we will therefore likely need EUS staging for rectal cancer. Patient is scheduled to get port placement and rectal mass biopsy by Dr. Tollie Pizza tomorrow, will also discuss with him about feasibility of pursuing colonoscopy and biopsy of a right-sided colon mass to get tissue diagnosis to see if it is invasive cancer versus dysplasia. Have also explained to patient that if we pursue chemoradiation for rectal cancer, then the right-sided colon cancer will not receive significant treatment since concurrent 5-FU chemotherapy is radiosensitizing dose only. Will see him back after biopsy. Also will need to coordinate colonoscopy and also EUS staging for rectal cancer once biopsy report is available. Patient does not have any major pain issues or major bleeding issues at this time.     In between visits, the patient has been advised to call or come to the ER in case of fevers, bleeding, acute sickness or new symptoms. Patient is agreeable to this plan.    Leia Alf, MD   03/08/2015 2:43 PM

## 2015-03-08 NOTE — Patient Instructions (Signed)
  Your procedure is scheduled on: 03/09/15 Report to Day Surgery. To find out your arrival time please call [phone] between 1PM - 3PM on 08:15.  Remember: Instructions that are not followed completely may result in serious medical risk, up to and including death, or upon the discretion of your surgeon and anesthesiologist your surgery may need to be rescheduled.    _x___ 1. Do not eat food or drink liquids after midnight. No gum chewing or hard candies.     __x__ 2. No Alcohol for 24 hours before or after surgery.   ____ 3. Bring all medications with you on the day of surgery if instructed.    ____ 4. Notify your doctor if there is any change in your medical condition     (cold, fever, infections).     Do not wear jewelry, make-up, hairpins, clips or nail polish.  Do not wear lotions, powders, or perfumes. You may wear deodorant.  Do not shave 48 hours prior to surgery. Men may shave face and neck.  Do not bring valuables to the hospital.    [facility] is not responsible for any belongings or valuables.               Contacts, dentures or bridgework may not be worn into surgery.  Leave your suitcase in the car. After surgery it may be brought to your room.  For patients admitted to the hospital, discharge time is determined by your                treatment team.   Patients discharged the day of surgery will not be allowed to drive home.   Please read over the following fact sheets that you were given:      ____ Take these medicines the morning of surgery with A SIP OF WATER:    1.none  2.   3.   4.  5.  6.  __x__ Fleet Enema (as directed)   __x__ Use CHG Soap as directed  ____ Use inhalers on the day of surgery  ____ Stop metformin 2 days prior to surgery    ____ Take 1/2 of usual insulin dose the night before surgery and none on the morning of surgery.   ____ Stop Coumadin/Plavix/aspirin on   ____ Stop Anti-inflammatories on    ____ Stop supplements until  after surgery.    ____ Bring C-Pap to the hospital.

## 2015-03-09 ENCOUNTER — Ambulatory Visit
Admission: RE | Admit: 2015-03-09 | Discharge: 2015-03-09 | Disposition: A | Discharge status: Home / Self Care | Payer: Medicare Other | Source: Ambulatory Visit | Attending: General Surgery | Admitting: General Surgery

## 2015-03-09 ENCOUNTER — Ambulatory Visit: Payer: Medicare Other | Admitting: Anesthesiology

## 2015-03-09 ENCOUNTER — Encounter: Payer: Self-pay | Admitting: *Deleted

## 2015-03-09 ENCOUNTER — Ambulatory Visit: Payer: Medicare Other

## 2015-03-09 ENCOUNTER — Encounter
Admission: RE | Disposition: A | Discharge status: Home / Self Care | Payer: Self-pay | Source: Ambulatory Visit | Attending: General Surgery

## 2015-03-09 DIAGNOSIS — C20 Malignant neoplasm of rectum: Secondary | ICD-10-CM

## 2015-03-09 DIAGNOSIS — R197 Diarrhea, unspecified: Secondary | ICD-10-CM | POA: Insufficient documentation

## 2015-03-09 DIAGNOSIS — E119 Type 2 diabetes mellitus without complications: Secondary | ICD-10-CM | POA: Insufficient documentation

## 2015-03-09 DIAGNOSIS — H9190 Unspecified hearing loss, unspecified ear: Secondary | ICD-10-CM | POA: Insufficient documentation

## 2015-03-09 DIAGNOSIS — K625 Hemorrhage of anus and rectum: Secondary | ICD-10-CM | POA: Insufficient documentation

## 2015-03-09 DIAGNOSIS — K649 Unspecified hemorrhoids: Secondary | ICD-10-CM | POA: Insufficient documentation

## 2015-03-09 DIAGNOSIS — Z86010 Personal history of colonic polyps: Secondary | ICD-10-CM | POA: Insufficient documentation

## 2015-03-09 DIAGNOSIS — Z79899 Other long term (current) drug therapy: Secondary | ICD-10-CM | POA: Insufficient documentation

## 2015-03-09 DIAGNOSIS — R634 Abnormal weight loss: Secondary | ICD-10-CM | POA: Insufficient documentation

## 2015-03-09 DIAGNOSIS — Z87891 Personal history of nicotine dependence: Secondary | ICD-10-CM | POA: Insufficient documentation

## 2015-03-09 HISTORY — PX: PORTACATH PLACEMENT: SHX2246

## 2015-03-09 HISTORY — PX: FLEXIBLE SIGMOIDOSCOPY: SHX5431

## 2015-03-09 LAB — GLUCOSE, CAPILLARY
Glucose-Capillary: 312 mg/dL — ABNORMAL HIGH (ref 65–99)
Glucose-Capillary: 305 mg/dL — ABNORMAL HIGH (ref 65–99)
Glucose-Capillary: 264 mg/dL — ABNORMAL HIGH (ref 65–99)

## 2015-03-09 SURGERY — INSERTION, TUNNELED CENTRAL VENOUS DEVICE, WITH PORT
Anesthesia: Monitor Anesthesia Care | Site: Rectum | Laterality: Right | Wound class: Clean

## 2015-03-09 MED ORDER — INSULIN ASPART PROT & ASPART (70-30 MIX) 100 UNIT/ML ~~LOC~~ SUSP
10.0000 [IU] | Freq: Once | SUBCUTANEOUS | Status: DC
  Filled 2015-03-09: qty 10

## 2015-03-09 MED ORDER — ONDANSETRON HCL 4 MG/2ML IJ SOLN: 4.0000 mg | Freq: Once | INTRAMUSCULAR | Status: DC | PRN

## 2015-03-09 MED ORDER — FLEET ENEMA 7-19 GM/118ML RE ENEM: 1.0000 | ENEMA | Freq: Once | RECTAL | Status: DC

## 2015-03-09 MED ORDER — ACETAMINOPHEN 325 MG PO TABS
650.0000 mg | ORAL_TABLET | ORAL | Status: DC | PRN
  Administered 2015-03-09: 650 mg via ORAL

## 2015-03-09 MED ORDER — FENTANYL CITRATE (PF) 100 MCG/2ML IJ SOLN
INTRAMUSCULAR | Status: DC | PRN
  Administered 2015-03-09: 50 ug via INTRAVENOUS

## 2015-03-09 MED ORDER — CEFAZOLIN SODIUM-DEXTROSE 2-3 GM-% IV SOLR
INTRAVENOUS | Status: AC
  Filled 2015-03-09: qty 50

## 2015-03-09 MED ORDER — PHENYLEPHRINE HCL 10 MG/ML IJ SOLN
INTRAMUSCULAR | Status: DC | PRN
  Administered 2015-03-09: 100 ug via INTRAVENOUS

## 2015-03-09 MED ORDER — KETAMINE HCL 10 MG/ML IJ SOLN
INTRAMUSCULAR | Status: DC | PRN
  Administered 2015-03-09: 20 mg via INTRAVENOUS

## 2015-03-09 MED ORDER — PROPOFOL 10 MG/ML IV BOLUS
INTRAVENOUS | Status: DC | PRN
  Administered 2015-03-09: 20 mg via INTRAVENOUS

## 2015-03-09 MED ORDER — SODIUM CHLORIDE 0.9 % IJ SOLN
INTRAMUSCULAR | Status: DC | PRN
  Administered 2015-03-09: 20 mL via INTRAVENOUS

## 2015-03-09 MED ORDER — CEFAZOLIN SODIUM-DEXTROSE 2-3 GM-% IV SOLR
2.0000 g | INTRAVENOUS | Status: AC
  Administered 2015-03-09: 2 g via INTRAVENOUS

## 2015-03-09 MED ORDER — SODIUM CHLORIDE 0.9 % IV SOLN
INTRAVENOUS | Status: DC
  Administered 2015-03-09: 09:00:00 via INTRAVENOUS

## 2015-03-09 MED ORDER — INSULIN ASPART 100 UNIT/ML ~~LOC~~ SOLN
SUBCUTANEOUS | Status: AC
  Administered 2015-03-09: 10 [IU] via SUBCUTANEOUS
  Filled 2015-03-09: qty 1

## 2015-03-09 MED ORDER — FAMOTIDINE 20 MG PO TABS
20.0000 mg | ORAL_TABLET | Freq: Once | ORAL | Status: AC
  Administered 2015-03-09: 20 mg via ORAL

## 2015-03-09 MED ORDER — FENTANYL CITRATE (PF) 100 MCG/2ML IJ SOLN: 25.0000 ug | INTRAMUSCULAR | Status: DC | PRN

## 2015-03-09 MED ORDER — LIDOCAINE HCL (PF) 1 % IJ SOLN
INTRAMUSCULAR | Status: AC
  Filled 2015-03-09: qty 30

## 2015-03-09 MED ORDER — INSULIN ASPART 100 UNIT/ML ~~LOC~~ SOLN
10.0000 [IU] | Freq: Once | SUBCUTANEOUS | Status: AC
  Administered 2015-03-09: 10 [IU] via SUBCUTANEOUS

## 2015-03-09 MED ORDER — FAMOTIDINE 20 MG PO TABS
ORAL_TABLET | ORAL | Status: AC
  Administered 2015-03-09: 20 mg via ORAL
  Filled 2015-03-09: qty 1

## 2015-03-09 MED ORDER — ACETAMINOPHEN 325 MG PO TABS
ORAL_TABLET | ORAL | Status: AC
  Filled 2015-03-09: qty 2

## 2015-03-09 MED ORDER — PROPOFOL INFUSION 10 MG/ML OPTIME
INTRAVENOUS | Status: DC | PRN
  Administered 2015-03-09: 50 ug/kg/min via INTRAVENOUS

## 2015-03-09 MED ORDER — SODIUM CHLORIDE 0.9 % IJ SOLN
INTRAMUSCULAR | Status: AC
  Filled 2015-03-09: qty 50

## 2015-03-09 MED ORDER — LIDOCAINE HCL (PF) 1 % IJ SOLN
INTRAMUSCULAR | Status: DC | PRN
  Administered 2015-03-09: 10 mL via INTRADERMAL

## 2015-03-09 MED ORDER — LIDOCAINE HCL (CARDIAC) 20 MG/ML IV SOLN
INTRAVENOUS | Status: DC | PRN
  Administered 2015-03-09: 60 mg via INTRAVENOUS

## 2015-03-09 MED ORDER — HYDROCODONE-ACETAMINOPHEN 7.5-325 MG PO TABS: 1.0000 | ORAL_TABLET | ORAL | Status: DC | PRN

## 2015-03-09 MED ORDER — MIDAZOLAM HCL 2 MG/2ML IJ SOLN
INTRAMUSCULAR | Status: DC | PRN
  Administered 2015-03-09: 2 mg via INTRAVENOUS

## 2015-03-09 SURGICAL SUPPLY — 42 items
BENZOIN TINCTURE PRP APPL 2/3 (GAUZE/BANDAGES/DRESSINGS) ×4 IMPLANT
BLADE SURG 15 STRL SS SAFETY (BLADE) ×4 IMPLANT
BRIEF STRETCH MATERNITY 2XLG (MISCELLANEOUS) ×4 IMPLANT
CANISTER SUCT 1200ML W/VALVE (MISCELLANEOUS) ×4 IMPLANT
CHLORAPREP W/TINT 26ML (MISCELLANEOUS) ×4 IMPLANT
CLOSURE WOUND 1/2 X4 (GAUZE/BANDAGES/DRESSINGS) ×1
COVER LIGHT HANDLE STERIS (MISCELLANEOUS) ×8 IMPLANT
DRAPE C-ARM XRAY 36X54 (DRAPES) ×4 IMPLANT
DRAPE LAPAROTOMY 100X77 ABD (DRAPES) IMPLANT
DRAPE LAPAROTOMY TRNSV 106X77 (MISCELLANEOUS) ×4 IMPLANT
DRAPE LEGGINS SURG 28X43 STRL (DRAPES) IMPLANT
DRAPE UNDER BUTTOCK W/FLU (DRAPES) IMPLANT
DRESSING TELFA 4X3 1S ST N-ADH (GAUZE/BANDAGES/DRESSINGS) ×4 IMPLANT
DRSG GAUZE PETRO 6X36 STRIP ST (GAUZE/BANDAGES/DRESSINGS) IMPLANT
DRSG TEGADERM 2-3/8X2-3/4 SM (GAUZE/BANDAGES/DRESSINGS) ×4 IMPLANT
DRSG TEGADERM 4X4.75 (GAUZE/BANDAGES/DRESSINGS) ×4 IMPLANT
GLOVE BIO SURGEON STRL SZ7.5 (GLOVE) ×8 IMPLANT
GLOVE INDICATOR 8.0 STRL GRN (GLOVE) ×8 IMPLANT
GOWN STRL REUS W/ TWL LRG LVL3 (GOWN DISPOSABLE) ×4 IMPLANT
GOWN STRL REUS W/TWL LRG LVL3 (GOWN DISPOSABLE) ×4
JELLY LUB 2OZ STRL (MISCELLANEOUS) ×2
JELLY LUBE 2OZ STRL (MISCELLANEOUS) ×2 IMPLANT
KIT RM TURNOVER STRD PROC AR (KITS) ×4 IMPLANT
LABEL OR SOLS (LABEL) ×4 IMPLANT
NDL SAFETY 25GX1.5 (NEEDLE) ×4 IMPLANT
NS IRRIG 500ML POUR BTL (IV SOLUTION) ×4 IMPLANT
PACK BASIN MINOR ARMC (MISCELLANEOUS) IMPLANT
PACK PORT-A-CATH (MISCELLANEOUS) ×4 IMPLANT
PAD GROUND ADULT SPLIT (MISCELLANEOUS) ×4 IMPLANT
PAD OB MATERNITY 4.3X12.25 (PERSONAL CARE ITEMS) ×4 IMPLANT
PAD PREP 24X41 OB/GYN DISP (PERSONAL CARE ITEMS) ×4 IMPLANT
PORTACATH POWER 8F (Port) ×4 IMPLANT
STRAP SAFETY BODY (MISCELLANEOUS) ×4 IMPLANT
STRIP CLOSURE SKIN 1/2X4 (GAUZE/BANDAGES/DRESSINGS) ×3 IMPLANT
SUCT SIGMOIDOSCOPE TIP 18 W/TU (SUCTIONS) ×4 IMPLANT
SUT PROLENE 3 0 SH DA (SUTURE) ×4 IMPLANT
SUT SILK 0 CT 1 30 (SUTURE) ×4 IMPLANT
SUT VIC AB 3-0 SH 27 (SUTURE) ×2
SUT VIC AB 3-0 SH 27X BRD (SUTURE) ×2 IMPLANT
SUT VIC AB 4-0 FS2 27 (SUTURE) ×4 IMPLANT
SWABSTK COMLB BENZOIN TINCTURE (MISCELLANEOUS) ×4 IMPLANT
SYR CONTROL 10ML (SYRINGE) ×8 IMPLANT

## 2015-03-09 NOTE — H&P (Signed)
Reviewed recent med onc recommendations. Plan for right power port and sigmoidoscopy and biopsy.  Elevated blood sugars noted. Will likely benefit from insulin therapy post procedure.

## 2015-03-09 NOTE — Progress Notes (Signed)
   03/09/15 0900  Clinical Encounter Type  Visited With Patient  Visit Type Pre-op  Referral From Nurse  Consult/Referral To Chaplain  Spiritual Encounters  Spiritual Needs Other (Comment) (Chaplain offered prayer. Patient kindly objected.)  Stress Factors  Patient Stress Factors Health changes  Family Stress Factors None identified  Advance Directives (For Healthcare)  Does patient have an advance directive? No  Would patient like information on creating an advanced directive? No - patient declined information   Chaplain provided therapeutic presence and empathic listening. Chaplain offered to pray for patient who kindly declined. AD [phone]

## 2015-03-09 NOTE — Anesthesia Preprocedure Evaluation (Addendum)
Anesthesia Evaluation  Patient identified by MRN, date of birth, ID band Patient awake    Reviewed: Allergy & Precautions, NPO status , Patient's Chart, lab work & pertinent test results, reviewed documented beta blocker date and time   Airway Mallampati: II  TM Distance: >3 FB Neck ROM: Full    Dental  (+) Implants   Pulmonary former smoker,          Cardiovascular     Neuro/Psych    GI/Hepatic   Endo/Other  diabetes  Renal/GU      Musculoskeletal   Abdominal   Peds  Hematology   Anesthesia Other Findings   Reproductive/Obstetrics                            Anesthesia Physical Anesthesia Plan  ASA: III  Anesthesia Plan: MAC   Post-op Pain Management:    Induction:   Airway Management Planned: Nasal Cannula  Additional Equipment:   Intra-op Plan:   Post-operative Plan:   Informed Consent: I have reviewed the patients History and Physical, chart, labs and discussed the procedure including the risks, benefits and alternatives for the proposed anesthesia with the patient or authorized representative who has indicated his/her understanding and acceptance.     Plan Discussed with: CRNA  Anesthesia Plan Comments:         Anesthesia Quick Evaluation

## 2015-03-09 NOTE — Progress Notes (Signed)
Met with Gary Hampton along with Dr Ma Hillock in MD exam room. Introduced Art therapist as a resource to make his care less complicated. Provided him with my contact information and encouraged him to call with any questions or concerns. Dr Curly Shores office contacted regarding need to obtain date for colonoscopy. Will arrange EUS in [facility] once colonoscopy date is arranged. To follow up with Dr Ma Hillock after EUS. Having rectal bx and PAC placed 5-12 by Dt Byrnett.

## 2015-03-09 NOTE — Op Note (Signed)
Preoperative diagnosis: Rectal cancer, need for central venous access.  Postoperative diagnosis: Same.   Operating surgeon: Ollen Bowl, M.D.  Anesthesia: Attended local, 10 mL 1% Xylocaine plain.  Estimated blood loss: Minimal  This 74 year old male was noted with a severely dysplastic tubulovillous adenoma the rectum 3 years ago. He finally presented for assessment due to increasing pain and drainage. Clinical exam shows a firm mass protruding from the posterior aspect of the rectum.  The patient received Kefzol prior to procedure.  With the patient in the supine position the right chest and neck was prepped with ChloraPrep and draped. Ultrasound is used to confirm patency of the subclavian vein. Under ultrasound guidance the vein was cannulated all by passage of the guidewire. Fluoroscopy showed the wired be up in the internal jugular vein. He was repositioned with fluoroscopy guidance into the SVC. The tract was dilated and the catheter placed at the junction of the SVC and right atrium. The catheter was told to a pocket on the right anterior chest. The port was anchored to the deep tissue with 3-0 Prolene sutures. The wound was closed in layers with 3-0 Vicryl to the adipose tissue and running 4-0 Vicryl septic suture for the skin.  Benzoin, Steri-Strips Telfa and Tegaderm dressing were applied.  The patient was placed in the left lateral daily disposition. The rigid sigmoidoscope was inserted and the tumor mass extends from the dentate line to approximately 7 cm. This encompasses the rectal wall from the 2 to 10:00 position. Multiple large-bore biopsies were obtained. Bleeding stopped spontaneously.  A rectal chest x-ray in recovery room showed the catheter tip as described above. No evidence of pneumothorax.

## 2015-03-09 NOTE — Anesthesia Postprocedure Evaluation (Signed)
  Anesthesia Post-op Note  Patient: Gary Hampton  Procedure(s) Performed: Procedure(s): INSERTION PORT-A-CATH (Right) FLEXIBLE SIGMOIDOSCOPY/rectal biopsy (N/A)  Anesthesia type:MAC  Patient location: PACU  Post pain: Pain level controlled  Post assessment: Post-op Vital signs reviewed, Patient's Cardiovascular Status Stable, Respiratory Function Stable, Patent Airway and No signs of Nausea or vomiting  Post vital signs: Reviewed and stable  Last Vitals:  Filed Vitals:   03/09/15 1130  BP: 146/79  Pulse: 73  Temp: 36.2 C  Resp: 24    Level of consciousness: awake, alert  and patient cooperative  Complications: No apparent anesthesia complications

## 2015-03-09 NOTE — Transfer of Care (Signed)
Immediate Anesthesia Transfer of Care Note  Patient: Gary Hampton  Procedure(s) Performed: Procedure(s): INSERTION PORT-A-CATH (Right) FLEXIBLE SIGMOIDOSCOPY/rectal biopsy (N/A)  Patient Location: PACU  Anesthesia Type:MAC  Level of Consciousness: awake, alert  and oriented  Airway & Oxygen Therapy: Patient Spontanous Breathing and Patient connected to face mask oxygen  Post-op Assessment: Report given to RN and Post -op Vital signs reviewed and stable  Post vital signs: Reviewed and stable  Last Vitals:  Filed Vitals:   03/09/15 0802  BP: 178/78  Pulse: 97  Temp: 36.6 C  Resp: 16    Complications: No apparent anesthesia complications

## 2015-03-09 NOTE — Discharge Instructions (Addendum)
Apply ice to chest area intermittently for comfort.  May remove outer plastic dressings in three days.  General Anesthesia, Care After Refer to this sheet in the next few weeks. These instructions provide you with information on caring for yourself after your procedure. Your health care provider may also give you more specific instructions. Your treatment has been planned according to current medical practices, but problems sometimes occur. Call your health care provider if you have any problems or questions after your procedure. WHAT TO EXPECT AFTER THE PROCEDURE After the procedure, it is typical to experience:  Sleepiness.  Nausea and vomiting. HOME CARE INSTRUCTIONS  For the first 24 hours after general anesthesia:  Have a responsible person with you.  Do not drive a car. If you are alone, do not take public transportation.  Do not drink alcohol.  Do not take medicine that has not been prescribed by your health care provider.  Do not sign important papers or make important decisions.  You may resume a normal diet and activities as directed by your health care provider.  Change bandages (dressings) as directed.  If you have questions or problems that seem related to general anesthesia, call the hospital and ask for the anesthetist or anesthesiologist on call. SEEK MEDICAL CARE IF:  You have nausea and vomiting that continue the day after anesthesia.  You develop a rash. SEEK IMMEDIATE MEDICAL CARE IF:   You have difficulty breathing.  You have chest pain.  You have any allergic problems. Document Released: 01/20/2001 Document Revised: 10/19/2013 Document Reviewed: 04/29/2013 [facility] Patient Information 2015 [place], [place]. This information is not intended to replace advice given to you by your health care provider. Make sure you discuss any questions you have with your health care provider.

## 2015-03-10 ENCOUNTER — Encounter: Payer: Self-pay | Admitting: Internal Medicine

## 2015-03-10 ENCOUNTER — Telehealth: Payer: Self-pay

## 2015-03-10 NOTE — Telephone Encounter (Signed)
Message left at Dr Curly Shores office to find out when colonoscopy is going to be scheduled so EUS can be arranged.

## 2015-03-12 ENCOUNTER — Encounter: Payer: Self-pay | Admitting: General Surgery

## 2015-03-13 ENCOUNTER — Other Ambulatory Visit: Payer: Self-pay

## 2015-03-13 ENCOUNTER — Other Ambulatory Visit: Payer: Self-pay | Admitting: General Surgery

## 2015-03-13 ENCOUNTER — Telehealth: Payer: Self-pay

## 2015-03-13 ENCOUNTER — Telehealth: Payer: Self-pay | Admitting: *Deleted

## 2015-03-13 DIAGNOSIS — C20 Malignant neoplasm of rectum: Secondary | ICD-10-CM

## 2015-03-13 DIAGNOSIS — C189 Malignant neoplasm of colon, unspecified: Secondary | ICD-10-CM

## 2015-03-13 LAB — SURGICAL PATHOLOGY

## 2015-03-13 MED ORDER — POLYETHYLENE GLYCOL 3350 17 GM/SCOOP PO POWD: ORAL | Status: DC

## 2015-03-13 NOTE — Telephone Encounter (Signed)
-----   Message from Robert Bellow, MD sent at 03/10/2015  3:32 PM EDT ----- Please contact endoscopy first thing Monday morning and see if we could do an colonoscopy on this patient starting at 7:15 on Wednesday, May 18 to facilitate planned chemotherapy.

## 2015-03-13 NOTE — Telephone Encounter (Signed)
Left message on machine to call back   EUS for 03/16/15 130 pm needs flex instructions

## 2015-03-13 NOTE — Telephone Encounter (Signed)
-----   Message from Milus Banister, MD sent at 03/13/2015  2:26 PM EDT ----- Regarding: RE: Needs EUS scheduled for right colon mass and rectal mass Marquie Aderhold, He needs lower EUS, radial, Moderate sedation is OK. Can we get it done this Thursday at [facility].  For rectal cancer staging.  Thanks   ----- Message -----    From: Barron Alvine, CMA    Sent: 03/13/2015   1:06 PM      To: Milus Banister, MD Subject: FW: Needs EUS scheduled for right colon mass#    ----- Message -----    From: Clent Jacks, RN    Sent: 03/13/2015  12:02 PM      To: Barron Alvine, CMA Subject: Needs EUS scheduled for right colon mass and#  Patient has PET + rectal mass and right colon mass. Rectal mass has been biopsied and colon mass to be biopsied on 5-18 during a colonoscopy. I need to get him an EUS for both of these areas as soon as possible after 5-18. Dr Ma Hillock at [facility] center is ordering Oncologist. What do you need from me to get him scheduled. He is aware it will be performed in [place].

## 2015-03-13 NOTE — H&P (Signed)
Gary Hampton is a 74 y.o. male. Here today for evaluation of rectal bleeding and diarrhea. He states it has gotten worse over the past 6 months. He states he can't control his bowel as well. His bowel use to move every other day but now its several times a day. Described as pudding consistency. He noticed occasional bleeding with his BM over the past several months. He does have hemorrhoids. He has used suppositories but didn't help. He has seen bleeding without a BM.  Denies family history of colon cancer.  When he had his colonoscopy 3 years ago he was having diarrhea described as "watery". At that time he was told that there was a rectal mass that could be cancer, but he declined evaluation as his diarrhea resolved after the colonoscopy.  Recent weight loss about 20 pounds. Denies abdominal pain.  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   HPI  Past Medical History  Diagnosis Date  . Diabetes mellitus without complication   . Colon polyp   . Hard of hearing     Past Surgical History  Procedure Laterality Date  . Colonoscopy  2013    Dr. Dionne Milo    No family history on file.  Social History History  Substance Use Topics  . Smoking status: Former Smoker -- 3 years    Quit date: 10/28/1978  . Smokeless tobacco: Never Used  . Alcohol Use: 0.0 oz/week    0 Standard drinks or equivalent per week     Comment: 3/day    No Known Allergies  Current Outpatient Prescriptions  Medication Sig Dispense Refill  . glimepiride (AMARYL) 2 MG tablet Take 2 mg by mouth daily with breakfast.     No current facility-administered medications for this visit.    Review of Systems Review of Systems  Constitutional: Negative.   Respiratory: Negative.  Cardiovascular: Negative.  Gastrointestinal: Positive for diarrhea and blood in stool.    Blood pressure 132/62, pulse 80, resp. rate 16, height 6' (1.829 m), weight 182 lb (82.555 kg).  Physical Exam Physical Exam  Constitutional: He is oriented to person, place, and time. He appears well-developed and well-nourished.  Neck: Neck supple.  Cardiovascular: Normal rate, regular rhythm, normal heart sounds and intact distal pulses.  Pulses:  Femoral pulses are 2+ on the right side, and 2+ on the left side.  Dorsalis pedis pulses are 2+ on the right side, and 2+ on the left side.  Pulmonary/Chest: Effort normal and breath sounds normal.  Abdominal: Soft. Normal appearance and bowel sounds are normal. There is no tenderness.  Genitourinary:     Lymphadenopathy:   He has no cervical adenopathy.   Left cervical: No superficial cervical adenopathy present.   Right: No inguinal and no supraclavicular adenopathy present.   Left: No inguinal and no supraclavicular adenopathy present.  Neurological: He is alert and oriented to person, place, and time.  Skin: Skin is warm and dry.    Data Reviewed Laboratory studies dated 02/17/2015 completed by his primary care physician showed a hemoglobin of 17.9 with an MCV of 94, white blood cell count of 8600. Nonfasting blood sugar 345. Creatinine 0.76. Estimated GFR 90. Hemoglobin A1c 10.6. CEA 29.0.  Colonoscopy report dated 02/11/2012 oriented and normal perianal rectal exam. Polyps were reported in the cecum, ileocecal valve, ascending colon, transverse colon and descending colon. A fungating nonobstructing medium-sized  mass was identified in the distal rectum. This was described as being non-circumferential. Biopsy reports from this procedure are not available at this time.  PCP notes of 02/17/2015 reviewed. 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 Location of this  lesion is not noted, and formal pathology needs to be reviewed.  Assessment   Rectal mass with bleeding, no anemia, previous clinical impression of rectal cancer.  Elevated CEA consistent with rectal cancer.  Poorly controlled diabetes.  Weight loss.         Rigid sigmoidoscopy and biopsy completed Mar 09, 2015 confirmed the clinical impression of rectal cancer.  Colonoscopy to assess the right colon lesion has been requested by Dr. Ma Hillock.

## 2015-03-13 NOTE — Telephone Encounter (Signed)
Message for patient to call the office.  Patient has been scheduled for a colonoscopy on 03-15-15 at [facility]. This has been okayed per [place] in Endoscopy.   Miralax prescription sent to patient's pharmacy.   We need to review instructions with patient.

## 2015-03-14 NOTE — Progress Notes (Signed)
Final diagnosis: Rectal cancer. Suspected ascending colon cancer. Colonoscopy pending.

## 2015-03-14 NOTE — Telephone Encounter (Signed)
Notified patient as instructed of pathology report. Discussed colonoscopy scheduled for 03/15/15. Instructions for colonoscopy preparation reviewed with patient. Patient aware to pick up Miralax prescription today at the pharmacy. He will report to registration at the hospital and will pick up his instructions from [place] in Endoscopy where I have faxed them. Pre registration has been called and a message left for them to contact patient to get him pre registered. Patient aware to call endoscopy today between 1pm and 3pm to get his arrival time. Patient aware of date and all instructions.

## 2015-03-14 NOTE — Telephone Encounter (Signed)
Pt answered the phone and asked to be called back he "is busy" and hung up.

## 2015-03-15 ENCOUNTER — Ambulatory Visit
Admission: RE | Admit: 2015-03-15 | Discharge: 2015-03-15 | Disposition: A | Discharge status: Home / Self Care | Payer: Medicare Other | Source: Ambulatory Visit | Attending: General Surgery | Admitting: General Surgery

## 2015-03-15 ENCOUNTER — Telehealth: Payer: Self-pay

## 2015-03-15 ENCOUNTER — Encounter
Admission: RE | Disposition: A | Discharge status: Home / Self Care | Payer: Self-pay | Source: Ambulatory Visit | Attending: General Surgery

## 2015-03-15 ENCOUNTER — Ambulatory Visit: Payer: Medicare Other | Admitting: Anesthesiology

## 2015-03-15 ENCOUNTER — Telehealth: Payer: Self-pay | Admitting: *Deleted

## 2015-03-15 DIAGNOSIS — C20 Malignant neoplasm of rectum: Secondary | ICD-10-CM | POA: Diagnosis present

## 2015-03-15 DIAGNOSIS — Z6825 Body mass index (BMI) 25.0-25.9, adult: Secondary | ICD-10-CM | POA: Insufficient documentation

## 2015-03-15 DIAGNOSIS — D374 Neoplasm of uncertain behavior of colon: Secondary | ICD-10-CM | POA: Insufficient documentation

## 2015-03-15 DIAGNOSIS — Z87891 Personal history of nicotine dependence: Secondary | ICD-10-CM | POA: Insufficient documentation

## 2015-03-15 DIAGNOSIS — E1165 Type 2 diabetes mellitus with hyperglycemia: Secondary | ICD-10-CM | POA: Diagnosis not present

## 2015-03-15 DIAGNOSIS — C189 Malignant neoplasm of colon, unspecified: Secondary | ICD-10-CM

## 2015-03-15 DIAGNOSIS — D122 Benign neoplasm of ascending colon: Secondary | ICD-10-CM | POA: Diagnosis not present

## 2015-03-15 DIAGNOSIS — R634 Abnormal weight loss: Secondary | ICD-10-CM | POA: Insufficient documentation

## 2015-03-15 DIAGNOSIS — D123 Benign neoplasm of transverse colon: Secondary | ICD-10-CM | POA: Insufficient documentation

## 2015-03-15 DIAGNOSIS — D125 Benign neoplasm of sigmoid colon: Secondary | ICD-10-CM | POA: Diagnosis not present

## 2015-03-15 DIAGNOSIS — D124 Benign neoplasm of descending colon: Secondary | ICD-10-CM | POA: Insufficient documentation

## 2015-03-15 HISTORY — PX: COLONOSCOPY: SHX5424

## 2015-03-15 LAB — GLUCOSE, CAPILLARY: Glucose-Capillary: 250 mg/dL — ABNORMAL HIGH (ref 65–99)

## 2015-03-15 SURGERY — COLONOSCOPY
Anesthesia: General

## 2015-03-15 MED ORDER — SODIUM CHLORIDE 0.45 % IV SOLN: INTRAVENOUS | Status: DC

## 2015-03-15 MED ORDER — PHENYLEPHRINE HCL 10 MG/ML IJ SOLN
INTRAMUSCULAR | Status: DC | PRN
  Administered 2015-03-15: 100 ug via INTRAVENOUS

## 2015-03-15 MED ORDER — PROPOFOL INFUSION 10 MG/ML OPTIME
INTRAVENOUS | Status: DC | PRN
  Administered 2015-03-15: 160 ug/kg/min via INTRAVENOUS

## 2015-03-15 MED ORDER — LIDOCAINE HCL (CARDIAC) 20 MG/ML IV SOLN
INTRAVENOUS | Status: DC | PRN
  Administered 2015-03-15: 60 mg via INTRAVENOUS

## 2015-03-15 MED ORDER — SODIUM CHLORIDE 0.9 % IV SOLN
INTRAVENOUS | Status: DC
  Administered 2015-03-15: 07:00:00 via INTRAVENOUS

## 2015-03-15 NOTE — Telephone Encounter (Signed)
The pt was notified but says he will need to call Dr Bary Castilla and discuss before he will have this test done.

## 2015-03-15 NOTE — H&P (Signed)
Rectal biopsy confirms cancer. Right colon biopsies requested by medical oncology prior to initiation of neo-adjuvant chemo/radiation. Patient reports tolerating prep well. No change in cardiopulmonary status.

## 2015-03-15 NOTE — Discharge Instructions (Signed)
Contact Dr. Beverly Gust office:  [phone] today regarding further follow up testing.   Restart diabetes medicine.

## 2015-03-15 NOTE — Telephone Encounter (Signed)
Oncology Nurse Navigator Documentation  Oncology Nurse Navigator Flowsheets 03/15/2015  Navigator Encounter Type Telephone  Referrals Other  Coordination of Care EUS  Time Spent with Patient 67   Gary Hampton unable to arrange for transporation to [facility] for EUS. Dr Ma Hillock notified and EUS being arranged at [facility] 03/30/15. Gary Hampton agrees with this plan. Instructed on registration process, calling the day before to obtain arrival time, prep and need for transportation home. These instructions along with my contact information also mailed to his home address.

## 2015-03-15 NOTE — Telephone Encounter (Signed)
Patient called and said that someone from [facility] called him and wanted him to have a EUS on 03/16/15. The patient said that he can not do this tomorrow and have a driver. He wanted to talk to you before he does anything else.

## 2015-03-15 NOTE — Transfer of Care (Signed)
Immediate Anesthesia Transfer of Care Note  Patient: Gary Hampton  Procedure(s) Performed: Procedure(s): COLONOSCOPY (N/A)  Patient Location: PACU  Anesthesia Type:General  Level of Consciousness: awake, alert , oriented and patient cooperative  Airway & Oxygen Therapy: Patient Spontanous Breathing and Patient connected to nasal cannula oxygen  Post-op Assessment: Report given to RN, Post -op Vital signs reviewed and stable and Patient moving all extremities X 4  Post vital signs: Reviewed and stable  Last Vitals:  Filed Vitals:   03/15/15 0754  BP: 103/46  Pulse: 81  Temp: 36.4 C  Resp: 21    Complications: No apparent anesthesia complications

## 2015-03-15 NOTE — Op Note (Signed)
[facility] Patient Name: Gary Hampton Procedure Date: 03/15/2015 7:06 AM MRN: [id number] Account #: [id number] Date of Birth: [birth year] Admit Type: Outpatient Age: 74 Room: [facility] ENDO ROOM 1 Gender: Male Note Status: Finalized Procedure:         Colonoscopy Indications:       Personal history of malignant rectal neoplasm Providers:         Robert Bellow, MD Referring MD:      Leona Carry. Hall Busing, MD (Referring MD) Medicines:         Monitored Anesthesia Care Complications:     No immediate complications. Procedure:         Pre-Anesthesia Assessment:                    - Prior to the procedure, a History and Physical was                     performed, and patient medications, allergies and                     sensitivities were reviewed. The patient's tolerance of                     previous anesthesia was reviewed.                    - The risks and benefits of the procedure and the sedation                     options and risks were discussed with the patient. All                     questions were answered and informed consent was obtained.                    After obtaining informed consent, the colonoscope was                     passed under direct vision. Throughout the procedure, the                     patient's blood pressure, pulse, and oxygen saturations                     were monitored continuously. The Colonoscope was                     introduced through the anus and advanced to the the cecum,                     identified by the appendiceal orifice, ileocecal valve and                     palpation. The colonoscopy was performed without                     difficulty. The patient tolerated the procedure well. The                     quality of the bowel preparation was excellent. Findings:      Multiple carpet-like polyps were found in the sigmoid colon, in the       descending colon, in the transverse colon, at the hepatic  flexure, in       the ascending  colon and in the proximal ascending colon. The polyps were       10 to 30 mm in size. Biopsies were taken with a cold forceps for       histology.      Rectal mass from the dentate line tto 7-8 cm, 60-70% circumference of       the rectal lumen. Impression:        - Multiple 10 to 30 mm polyps in the sigmoid colon, in the                     descending colon, in the transverse colon, at the hepatic                     flexure, in the ascending colon and in the proximal                     ascending colon. Biopsied. Recommendation:    - Telephone endoscopist for pathology results in 1 week. Procedure Code(s): --- Professional ---                    [id number], Colonoscopy, flexible; with biopsy, single or                     multiple Diagnosis Code(s): --- Professional ---                    D12.5, Benign neoplasm of sigmoid colon                    D12.4, Benign neoplasm of descending colon                    D12.3, Benign neoplasm of transverse colon                    D12.2, Benign neoplasm of ascending colon                    Z85.048, Personal history of other malignant neoplasm of                     rectum, rectosigmoid junction, and anus CPT copyright 2014 American Medical Association. All rights reserved. The codes documented in this report are preliminary and upon coder review may  be revised to meet current compliance requirements. Robert Bellow, MD 03/15/2015 7:53:14 AM This report has been signed electronically. Number of Addenda: 0 Note Initiated On: 03/15/2015 7:06 AM Scope Withdrawal Time: 0 hours 18 minutes 40 seconds  Total Procedure Duration: 0 hours 25 minutes 3 seconds       [facility]

## 2015-03-15 NOTE — Telephone Encounter (Signed)
OK 

## 2015-03-15 NOTE — Anesthesia Postprocedure Evaluation (Signed)
  Anesthesia Post-op Note  Patient: Gary Hampton  Procedure(s) Performed: Procedure(s): COLONOSCOPY (N/A)  Anesthesia type:General  Patient location: PACU  Post pain: Pain level controlled  Post assessment: Post-op Vital signs reviewed, Patient's Cardiovascular Status Stable, Respiratory Function Stable, Patent Airway and No signs of Nausea or vomiting  Post vital signs: Reviewed and stable  Last Vitals:  Filed Vitals:   03/15/15 0754  BP: 103/46  Pulse: 81  Temp: 36.4 C  Resp: 21    Level of consciousness: awake, alert  and patient cooperative  Complications: No apparent anesthesia complications

## 2015-03-15 NOTE — Telephone Encounter (Signed)
Referring notified me that the pt will have the procedure at [facility].  Procedure has been cx.

## 2015-03-15 NOTE — Anesthesia Preprocedure Evaluation (Signed)
Anesthesia Evaluation  Patient identified by MRN, date of birth, ID band Patient awake    Reviewed: Allergy & Precautions, NPO status   History of Anesthesia Complications Negative for: history of anesthetic complications  Airway Mallampati: II       Dental  (+) Implants, Teeth Intact   Pulmonary former smoker,    Pulmonary exam normal       Cardiovascular Exercise Tolerance: Good negative cardio ROS Normal cardiovascular examRhythm:Regular Rate:Normal     Neuro/Psych negative neurological ROS  negative psych ROS   GI/Hepatic negative GI ROS, Neg liver ROS,   Endo/Other  diabetes, Poorly Controlled, Type 2  Renal/GU negative Renal ROS  negative genitourinary   Musculoskeletal negative musculoskeletal ROS (+)   Abdominal Normal abdominal exam  (+)   Peds negative pediatric ROS (+)  Hematology negative hematology ROS (+)   Anesthesia Other Findings   Reproductive/Obstetrics negative OB ROS                             Anesthesia Physical Anesthesia Plan  ASA: III  Anesthesia Plan: General   Post-op Pain Management:    Induction: Intravenous  Airway Management Planned: Nasal Cannula  Additional Equipment:   Intra-op Plan:   Post-operative Plan:   Informed Consent: I have reviewed the patients History and Physical, chart, labs and discussed the procedure including the risks, benefits and alternatives for the proposed anesthesia with the patient or authorized representative who has indicated his/her understanding and acceptance.     Plan Discussed with: CRNA and Surgeon  Anesthesia Plan Comments:         Anesthesia Quick Evaluation

## 2015-03-15 NOTE — Telephone Encounter (Signed)
FYI:  The pt states he doesn't have a ride for tomorrow and cx procedure, Dr Ma Hillock is probably going to be doing it in [place].

## 2015-03-16 ENCOUNTER — Ambulatory Visit (HOSPITAL_COMMUNITY): Admission: RE | Admit: 2015-03-16 | Payer: Medicare Other | Source: Ambulatory Visit | Admitting: Gastroenterology

## 2015-03-16 ENCOUNTER — Encounter (HOSPITAL_COMMUNITY): Admission: RE | Payer: Self-pay | Source: Ambulatory Visit

## 2015-03-16 LAB — SURGICAL PATHOLOGY

## 2015-03-16 SURGERY — ULTRASOUND, LOWER GI TRACT, ENDOSCOPIC
Anesthesia: Moderate Sedation

## 2015-03-20 ENCOUNTER — Telehealth: Payer: Self-pay

## 2015-03-20 ENCOUNTER — Ambulatory Visit
Admit: 2015-03-20 | Discharge: 2015-03-20 | Disposition: A | Discharge status: Home / Self Care | Payer: Medicare Other | Attending: Radiation Oncology | Admitting: Radiation Oncology

## 2015-03-20 DIAGNOSIS — Z510 Encounter for antineoplastic radiation therapy: Secondary | ICD-10-CM | POA: Diagnosis present

## 2015-03-20 DIAGNOSIS — C20 Malignant neoplasm of rectum: Secondary | ICD-10-CM | POA: Diagnosis not present

## 2015-03-20 NOTE — Telephone Encounter (Signed)
Oncology Nurse Navigator Documentation  Oncology Nurse Navigator Flowsheets 03/15/2015 03/20/2015  Navigator Encounter Type Telephone Telephone  Referrals Other -  Coordination of Care EUS EUS  Time Spent with Patient 45 15   Mr Tome notified of cancellation of EUS. Per Dr Ma Hillock it is not needed at this time.

## 2015-03-21 ENCOUNTER — Encounter: Payer: Self-pay | Admitting: General Surgery

## 2015-03-22 ENCOUNTER — Ambulatory Visit: Payer: Medicare Other | Admitting: General Surgery

## 2015-03-23 ENCOUNTER — Ambulatory Visit: Payer: Medicare Other

## 2015-03-23 DIAGNOSIS — Z510 Encounter for antineoplastic radiation therapy: Secondary | ICD-10-CM | POA: Diagnosis not present

## 2015-03-23 NOTE — Patient Instructions (Signed)
Fluorouracil, 5-FU injection What is this medicine? FLUOROURACIL, 5-FU (flure oh YOOR a sil) is a chemotherapy drug. It slows the growth of cancer cells. This medicine is used to treat many types of cancer like breast cancer, colon or rectal cancer, pancreatic cancer, and stomach cancer. This medicine may be used for other purposes; ask your health care provider or pharmacist if you have questions. COMMON BRAND NAME(S): Adrucil What should I tell my health care provider before I take this medicine? They need to know if you have any of these conditions: -blood disorders -dihydropyrimidine dehydrogenase (DPD) deficiency -infection (especially a virus infection such as chickenpox, cold sores, or herpes) -kidney disease -liver disease -malnourished, poor nutrition -recent or ongoing radiation therapy -an unusual or allergic reaction to fluorouracil, other chemotherapy, other medicines, foods, dyes, or preservatives -pregnant or trying to get pregnant -breast-feeding How should I use this medicine? This drug is given as an infusion or injection into a vein. It is administered in a hospital or clinic by a specially trained health care professional. Talk to your pediatrician regarding the use of this medicine in children. Special care may be needed. Overdosage: If you think you have taken too much of this medicine contact a poison control center or emergency room at once. NOTE: This medicine is only for you. Do not share this medicine with others. What if I miss a dose? It is important not to miss your dose. Call your doctor or health care professional if you are unable to keep an appointment. What may interact with this medicine? -allopurinol -cimetidine -dapsone -digoxin -hydroxyurea -leucovorin -levamisole -medicines for seizures like ethotoin, fosphenytoin, phenytoin -medicines to increase blood counts like filgrastim, pegfilgrastim, sargramostim -medicines that treat or prevent blood  clots like warfarin, enoxaparin, and dalteparin -methotrexate -metronidazole -pyrimethamine -some other chemotherapy drugs like busulfan, cisplatin, estramustine, vinblastine -trimethoprim -trimetrexate -vaccines Talk to your doctor or health care professional before taking any of these medicines: -acetaminophen -aspirin -ibuprofen -ketoprofen -naproxen This list may not describe all possible interactions. Give your health care provider a list of all the medicines, herbs, non-prescription drugs, or dietary supplements you use. Also tell them if you smoke, drink alcohol, or use illegal drugs. Some items may interact with your medicine. What should I watch for while using this medicine? Visit your doctor for checks on your progress. This drug may make you feel generally unwell. This is not uncommon, as chemotherapy can affect healthy cells as well as cancer cells. Report any side effects. Continue your course of treatment even though you feel ill unless your doctor tells you to stop. In some cases, you may be given additional medicines to help with side effects. Follow all directions for their use. Call your doctor or health care professional for advice if you get a fever, chills or sore throat, or other symptoms of a cold or flu. Do not treat yourself. This drug decreases your body's ability to fight infections. Try to avoid being around people who are sick. This medicine may increase your risk to bruise or bleed. Call your doctor or health care professional if you notice any unusual bleeding. Be careful brushing and flossing your teeth or using a toothpick because you may get an infection or bleed more easily. If you have any dental work done, tell your dentist you are receiving this medicine. Avoid taking products that contain aspirin, acetaminophen, ibuprofen, naproxen, or ketoprofen unless instructed by your doctor. These medicines may hide a fever. Do not become pregnant while taking this    medicine. Women should inform their doctor if they wish to become pregnant or think they might be pregnant. There is a potential for serious side effects to an unborn child. Talk to your health care professional or pharmacist for more information. Do not breast-feed an infant while taking this medicine. Men should inform their doctor if they wish to father a child. This medicine may lower sperm counts. Do not treat diarrhea with over the counter products. Contact your doctor if you have diarrhea that lasts more than 2 days or if it is severe and watery. This medicine can make you more sensitive to the sun. Keep out of the sun. If you cannot avoid being in the sun, wear protective clothing and use sunscreen. Do not use sun lamps or tanning beds/booths. What side effects may I notice from receiving this medicine? Side effects that you should report to your doctor or health care professional as soon as possible: -allergic reactions like skin rash, itching or hives, swelling of the face, lips, or tongue -low blood counts - this medicine may decrease the number of white blood cells, red blood cells and platelets. You may be at increased risk for infections and bleeding. -signs of infection - fever or chills, cough, sore throat, pain or difficulty passing urine -signs of decreased platelets or bleeding - bruising, pinpoint red spots on the skin, black, tarry stools, blood in the urine -signs of decreased red blood cells - unusually weak or tired, fainting spells, lightheadedness -breathing problems -changes in vision -chest pain -mouth sores -nausea and vomiting -pain, swelling, redness at site where injected -pain, tingling, numbness in the hands or feet -redness, swelling, or sores on hands or feet -stomach pain -unusual bleeding Side effects that usually do not require medical attention (report to your doctor or health care professional if they continue or are bothersome): -changes in finger or  toe nails -diarrhea -dry or itchy skin -hair loss -headache -loss of appetite -sensitivity of eyes to the light -stomach upset -unusually teary eyes This list may not describe all possible side effects. Call your doctor for medical advice about side effects. You may report side effects to FDA at 1-800-FDA-1088. Where should I keep my medicine? This drug is given in a hospital or clinic and will not be stored at home. NOTE: This sheet is a summary. It may not cover all possible information. If you have questions about this medicine, talk to your doctor, pharmacist, or health care provider.  2015, Elsevier/Gold Standard. (2008-02-17 13:53:16)   

## 2015-03-27 ENCOUNTER — Other Ambulatory Visit: Payer: Self-pay | Admitting: *Deleted

## 2015-03-27 ENCOUNTER — Telehealth: Payer: Self-pay | Admitting: *Deleted

## 2015-03-27 DIAGNOSIS — C20 Malignant neoplasm of rectum: Secondary | ICD-10-CM

## 2015-03-27 NOTE — Telephone Encounter (Signed)
Contacted pt for him to come in and get chemotherapy teaching class 5/26 9 am to 12 noon and to start treament 5/31 9:45 to have labs then see dr pandit with chemo and radiation 5/31.  Pt informed me that he is not a morning person and that if we could change his appt to afternoon he would come but otherwise he is not coming.   I told him I could see what I could do and call him back.

## 2015-03-27 NOTE — Telephone Encounter (Signed)
Called pt three times and got his voicemail.  Told him that I could arrange him to come in on Friday afternoon at 2-3 pm and I would do his chemotherapy teaching and he could get labs drawn and that I had changed his chemo  appt on 5/31 til 1:45 .  Gave him my number at work as well as my cell phone # to call me back and let me know if this would be acceptable and it is in afternoon which he has requested.

## 2015-03-27 NOTE — Telephone Encounter (Signed)
Called at least 5 times on Friday and left messages each time.  Asking if he could come in today like my previous message that I could teach him the chemo.  Later in afternoon I left messages that I could have him come in Monday even though it is a holiday i would be glad to teach him about the chemo and give him resources and then he could come on tues and have treatment.  Left my work number as well as my cell phone for him to contact me.

## 2015-03-28 ENCOUNTER — Ambulatory Visit
Admission: RE | Admit: 2015-03-28 | Discharge: 2015-03-28 | Disposition: A | Discharge status: Home / Self Care | Payer: Medicare Other | Source: Ambulatory Visit | Attending: Radiation Oncology | Admitting: Radiation Oncology

## 2015-03-28 ENCOUNTER — Other Ambulatory Visit: Payer: Medicare Other

## 2015-03-28 ENCOUNTER — Ambulatory Visit: Payer: Medicare Other

## 2015-03-28 ENCOUNTER — Ambulatory Visit: Payer: Medicare Other | Admitting: Internal Medicine

## 2015-03-28 DIAGNOSIS — Z510 Encounter for antineoplastic radiation therapy: Secondary | ICD-10-CM | POA: Diagnosis not present

## 2015-03-29 ENCOUNTER — Other Ambulatory Visit: Payer: Medicare Other

## 2015-03-29 ENCOUNTER — Ambulatory Visit: Payer: Medicare Other

## 2015-03-29 ENCOUNTER — Ambulatory Visit
Admission: RE | Admit: 2015-03-29 | Discharge: 2015-03-29 | Disposition: A | Discharge status: Home / Self Care | Payer: Medicare Other | Source: Ambulatory Visit | Attending: Radiation Oncology | Admitting: Radiation Oncology

## 2015-03-29 DIAGNOSIS — Z510 Encounter for antineoplastic radiation therapy: Secondary | ICD-10-CM | POA: Diagnosis not present

## 2015-03-30 ENCOUNTER — Ambulatory Visit
Admission: RE | Admit: 2015-03-30 | Discharge: 2015-03-30 | Disposition: A | Discharge status: Home / Self Care | Payer: Medicare Other | Source: Ambulatory Visit | Attending: Radiation Oncology | Admitting: Radiation Oncology

## 2015-03-30 DIAGNOSIS — Z510 Encounter for antineoplastic radiation therapy: Secondary | ICD-10-CM | POA: Diagnosis not present

## 2015-03-31 ENCOUNTER — Ambulatory Visit
Admission: RE | Admit: 2015-03-31 | Discharge: 2015-03-31 | Disposition: A | Discharge status: Home / Self Care | Payer: Medicare Other | Source: Ambulatory Visit | Attending: Radiation Oncology | Admitting: Radiation Oncology

## 2015-03-31 DIAGNOSIS — Z510 Encounter for antineoplastic radiation therapy: Secondary | ICD-10-CM | POA: Diagnosis not present

## 2015-04-02 ENCOUNTER — Other Ambulatory Visit: Payer: Medicare Other | Admitting: *Deleted

## 2015-04-02 DIAGNOSIS — C20 Malignant neoplasm of rectum: Secondary | ICD-10-CM

## 2015-04-03 ENCOUNTER — Inpatient Hospital Stay: Payer: Medicare Other

## 2015-04-03 ENCOUNTER — Other Ambulatory Visit: Payer: Self-pay | Admitting: *Deleted

## 2015-04-03 ENCOUNTER — Ambulatory Visit
Admission: RE | Admit: 2015-04-03 | Discharge: 2015-04-03 | Disposition: A | Discharge status: Home / Self Care | Payer: Medicare Other | Source: Ambulatory Visit | Attending: Radiation Oncology | Admitting: Radiation Oncology

## 2015-04-03 ENCOUNTER — Inpatient Hospital Stay: Payer: Medicare Other | Attending: Internal Medicine | Admitting: Internal Medicine

## 2015-04-03 VITALS — BP 153/90 | HR 97 | Temp 96.3°F | Resp 20 | Ht 72.0 in | Wt 179.7 lb

## 2015-04-03 DIAGNOSIS — C20 Malignant neoplasm of rectum: Secondary | ICD-10-CM | POA: Diagnosis not present

## 2015-04-03 DIAGNOSIS — E119 Type 2 diabetes mellitus without complications: Secondary | ICD-10-CM | POA: Insufficient documentation

## 2015-04-03 DIAGNOSIS — Z5111 Encounter for antineoplastic chemotherapy: Secondary | ICD-10-CM | POA: Insufficient documentation

## 2015-04-03 DIAGNOSIS — K635 Polyp of colon: Secondary | ICD-10-CM | POA: Insufficient documentation

## 2015-04-03 DIAGNOSIS — Z808 Family history of malignant neoplasm of other organs or systems: Secondary | ICD-10-CM | POA: Diagnosis not present

## 2015-04-03 DIAGNOSIS — Z79899 Other long term (current) drug therapy: Secondary | ICD-10-CM | POA: Diagnosis not present

## 2015-04-03 DIAGNOSIS — Z87891 Personal history of nicotine dependence: Secondary | ICD-10-CM | POA: Diagnosis not present

## 2015-04-03 DIAGNOSIS — Z510 Encounter for antineoplastic radiation therapy: Secondary | ICD-10-CM | POA: Diagnosis not present

## 2015-04-03 LAB — CBC WITH DIFFERENTIAL/PLATELET
BASOS ABS: 0.2 10*3/uL — AB (ref 0–0.1)
Basophils Relative: 3 %
EOS ABS: 0.2 10*3/uL (ref 0–0.7)
Eosinophils Relative: 3 %
HCT: 43.2 % (ref 40.0–52.0)
HEMOGLOBIN: 14.6 g/dL (ref 13.0–18.0)
LYMPHS ABS: 0.8 10*3/uL — AB (ref 1.0–3.6)
Lymphocytes Relative: 13 %
MCH: 31.1 pg (ref 26.0–34.0)
MCHC: 33.8 g/dL (ref 32.0–36.0)
MCV: 92.1 fL (ref 80.0–100.0)
Monocytes Absolute: 0.4 10*3/uL (ref 0.2–1.0)
Monocytes Relative: 7 %
NEUTROS ABS: 4.4 10*3/uL (ref 1.4–6.5)
Neutrophils Relative %: 74 %
Platelets: 226 10*3/uL (ref 150–440)
RBC: 4.69 MIL/uL (ref 4.40–5.90)
RDW: 13.2 % (ref 11.5–14.5)
WBC: 5.9 10*3/uL (ref 3.8–10.6)

## 2015-04-03 LAB — COMPREHENSIVE METABOLIC PANEL
ALT: 12 U/L — ABNORMAL LOW (ref 17–63)
ANION GAP: 5 (ref 5–15)
AST: 13 U/L — AB (ref 15–41)
Albumin: 3.6 g/dL (ref 3.5–5.0)
Alkaline Phosphatase: 71 U/L (ref 38–126)
BUN: 22 mg/dL — AB (ref 6–20)
CALCIUM: 8.5 mg/dL — AB (ref 8.9–10.3)
CHLORIDE: 101 mmol/L (ref 101–111)
CO2: 26 mmol/L (ref 22–32)
CREATININE: 1.04 mg/dL (ref 0.61–1.24)
GFR calc Af Amer: >60 mL/min (ref >60)
GFR calc non Af Amer: >60 mL/min (ref >60)
GLUCOSE: 427 mg/dL — AB (ref 65–99)
Potassium: 4.7 mmol/L (ref 3.5–5.1)
Sodium: 132 mmol/L — ABNORMAL LOW (ref 135–145)
Total Bilirubin: 0.8 mg/dL (ref 0.3–1.2)
Total Protein: 6.2 g/dL — ABNORMAL LOW (ref 6.5–8.1)

## 2015-04-03 MED ORDER — LIDOCAINE-PRILOCAINE 2.5-2.5 % EX CREA: TOPICAL_CREAM | CUTANEOUS | Status: DC

## 2015-04-03 MED ORDER — PROMETHAZINE HCL 25 MG PO TABS: 25.0000 mg | ORAL_TABLET | Freq: Four times a day (QID) | ORAL | Status: DC | PRN

## 2015-04-03 MED ORDER — SODIUM CHLORIDE 0.9 % IJ SOLN
10.0000 mL | INTRAMUSCULAR | Status: DC | PRN
Start: 2015-04-03 — End: 2015-04-03
  Filled 2015-04-03: qty 10

## 2015-04-03 MED ORDER — FLUOROURACIL CHEMO INJECTION 5 GM/100ML
225.0000 mg/m2/d | INTRAVENOUS | Status: DC
  Administered 2015-04-03: 2300 mg via INTRAVENOUS
  Filled 2015-04-03: qty 46

## 2015-04-03 MED ORDER — HEPARIN SOD (PORK) LOCK FLUSH 100 UNIT/ML IV SOLN: 500.0000 [IU] | Freq: Once | INTRAVENOUS | Status: DC | PRN

## 2015-04-03 NOTE — Progress Notes (Signed)
Called in emla cream and phenergan for pt who is starting 5FU today to [facility] and pt aware it is being called in

## 2015-04-04 ENCOUNTER — Ambulatory Visit
Admission: RE | Admit: 2015-04-04 | Discharge: 2015-04-04 | Disposition: A | Discharge status: Home / Self Care | Payer: Medicare Other | Source: Ambulatory Visit | Attending: Radiation Oncology | Admitting: Radiation Oncology

## 2015-04-04 ENCOUNTER — Inpatient Hospital Stay
Admission: RE | Admit: 2015-04-04 | Discharge: 2015-04-04 | Disposition: A | Discharge status: Home / Self Care | Payer: Self-pay | Source: Ambulatory Visit | Attending: Radiation Oncology | Admitting: Radiation Oncology

## 2015-04-04 ENCOUNTER — Encounter: Payer: Self-pay | Admitting: Pharmacist

## 2015-04-04 DIAGNOSIS — Z510 Encounter for antineoplastic radiation therapy: Secondary | ICD-10-CM | POA: Diagnosis not present

## 2015-04-04 LAB — CEA: CEA: 33.8 ng/mL — AB (ref 0.0–4.7)

## 2015-04-04 NOTE — Progress Notes (Signed)
Patient returned to clinic due to alarming of Infusystem Pump for 5FU. Patient had inadvertently clamped tubing to pump in the middle of the night. Pump has been off for approximately 12 hours. Pump due to be removed at 4 pm on Friday 04/07/15. Pump initially set at 1.6 ml / hour. Increased to 1.8 ml/hour so that it would be finished by 4 pm on Friday.

## 2015-04-05 ENCOUNTER — Ambulatory Visit
Admission: RE | Admit: 2015-04-05 | Discharge: 2015-04-05 | Disposition: A | Discharge status: Home / Self Care | Payer: Medicare Other | Source: Ambulatory Visit | Attending: Radiation Oncology | Admitting: Radiation Oncology

## 2015-04-05 DIAGNOSIS — Z510 Encounter for antineoplastic radiation therapy: Secondary | ICD-10-CM | POA: Diagnosis not present

## 2015-04-06 ENCOUNTER — Ambulatory Visit
Admission: RE | Admit: 2015-04-06 | Discharge: 2015-04-06 | Disposition: A | Discharge status: Home / Self Care | Payer: Medicare Other | Source: Ambulatory Visit | Attending: Radiation Oncology | Admitting: Radiation Oncology

## 2015-04-06 DIAGNOSIS — Z510 Encounter for antineoplastic radiation therapy: Secondary | ICD-10-CM | POA: Diagnosis not present

## 2015-04-07 ENCOUNTER — Ambulatory Visit
Admission: RE | Admit: 2015-04-07 | Discharge: 2015-04-07 | Disposition: A | Discharge status: Home / Self Care | Payer: Medicare Other | Source: Ambulatory Visit | Attending: Radiation Oncology | Admitting: Radiation Oncology

## 2015-04-07 ENCOUNTER — Inpatient Hospital Stay: Payer: Medicare Other

## 2015-04-07 VITALS — BP 118/79 | HR 95 | Resp 20

## 2015-04-07 DIAGNOSIS — Z510 Encounter for antineoplastic radiation therapy: Secondary | ICD-10-CM | POA: Diagnosis not present

## 2015-04-07 DIAGNOSIS — Z5111 Encounter for antineoplastic chemotherapy: Secondary | ICD-10-CM | POA: Diagnosis not present

## 2015-04-07 DIAGNOSIS — C801 Malignant (primary) neoplasm, unspecified: Secondary | ICD-10-CM

## 2015-04-07 MED ORDER — HEPARIN SOD (PORK) LOCK FLUSH 100 UNIT/ML IV SOLN
500.0000 [IU] | Freq: Once | INTRAVENOUS | Status: AC
  Administered 2015-04-07: 500 [IU] via INTRAVENOUS
  Filled 2015-04-07: qty 5

## 2015-04-07 MED ORDER — SODIUM CHLORIDE 0.9 % IJ SOLN
10.0000 mL | Freq: Once | INTRAMUSCULAR | Status: AC
  Administered 2015-04-07: 10 mL via INTRAVENOUS
  Filled 2015-04-07: qty 10

## 2015-04-09 NOTE — Progress Notes (Signed)
[place]  Telephone:(336) [phone] Fax:(336) [phone]     ID: Gary Hampton OB: 1940-12-29  MR#: [id number]  ZHY#:[id number]  Patient Care Team: Albina Billet, MD as PCP - General (Internal Medicine) Albina Billet, MD (Internal Medicine) Robert Bellow, MD (General Surgery) Clent Jacks, RN as Registered Nurse  CHIEF COMPLAINT/DIAGNOSIS:  1. Newly diagnosed adeno carcinoma rectum, clinical stage III (PET-positive Progressive Rectal Mass now protruding outside of the anus (colonoscopy 2013 had reported tubulovillous adenoma with high-grade dysplasia. Patient opted for no treatment at that time). 2. PET-positive Right Colon Mass (colonoscopy 2013 had reported tubulovillous adenoma without high-grade dysplasia) with colonoscopy and biopsy showing dysplastic polyp, also has numerous other polyps on recent colonoscopy and is planned for colectomy after chemoradiation for rectal cancer.  HISTORY OF PRESENT ILLNESS:  Patient returns for oncology followup and plan start 5FU infusion chemo. He started radiation last week but missed coming for scheduled chemo appt. Patient states that his appetite and weight have been steady. He denies any new cough chest pain dyspnea or hemoptysis. No new bone pains. States that he is physically active and cares for himself since he lives alone. Mild diarrhea, denies new BRBPR. No nausea or vomiting. No new pain issues, 0/10.  REVIEW OF SYSTEMS:   ROS As in HPI above. In addition, no fever, chills or sweats. No new headaches or focal weakness.  No new mood disturbances. No  sore throat, cough, shortness of breath, sputum, hemoptysis or chest pain. No dizziness or palpitation. No abdominal pain, constipation, diarrhea, dysuria or hematuria. No new skin rash or bleeding symptoms. No new paresthesias in extremities. PS ECOG 1.  PAST MEDICAL HISTORY: Reviewed Past Medical History  Diagnosis Date  . Diabetes mellitus without complication   .  Colon polyp   . Hard of hearing   . Cancer     Rectal  PAST SURGICAL HISTORY:Reviewed Past Surgical History  Procedure Laterality Date  . Colonoscopy  2013    Dr. Dionne Milo  . Cataract extraction Right   . No past surgeries    . Portacath placement Right 03/09/2015    Procedure: INSERTION PORT-A-CATH;  Surgeon: Robert Bellow, MD;  Location: [facility] ORS;  Service: General;  Laterality: Right;  . Flexible sigmoidoscopy N/A 03/09/2015    Procedure: FLEXIBLE SIGMOIDOSCOPY/rectal biopsy;  Surgeon: Robert Bellow, MD;  Location: [facility] ORS;  Service: General;  Laterality: N/A;  . Colonoscopy N/A 03/15/2015    Procedure: COLONOSCOPY;  Surgeon: Robert Bellow, MD;  Location: [facility] ENDOSCOPY;  Service: Endoscopy;  Laterality: N/A;    FAMILY HISTORY:Reviewed Remarkable for breast cancer, melanoma. Denies GI malignancies.  ADVANCED DIRECTIVES:  <no information>  SOCIAL HISTORY: Reviewed History  Substance Use Topics  . Smoking status: Former Smoker -- 3 years    Quit date: 10/28/1978  . Smokeless tobacco: Never Used  . Alcohol Use: 0.0 oz/week    0 Standard drinks or equivalent per week     Comment: 3/day    No Known Allergies  Current Outpatient Prescriptions  Medication Sig Dispense Refill  . INSULIN ASPART [place] Inject 18 Units into the skin.    Marland Kitchen loperamide (IMODIUM A-D) 2 MG tablet Take 2 mg by mouth 4 (four) times daily as needed for diarrhea or loose stools.    Marland Kitchen glimepiride (AMARYL) 2 MG tablet Take 2 mg by mouth daily with breakfast.    . lidocaine-prilocaine (EMLA) cream Small amount of cream 1 hour before chemotherapy treatment 30 g 0  .  promethazine (PHENERGAN) 25 MG tablet Take 1 tablet (25 mg total) by mouth every 6 (six) hours as needed for nausea or vomiting. 30 tablet 1   No current facility-administered medications for this visit.    OBJECTIVE: Filed Vitals:   04/03/15 1417  BP: 153/90  Pulse: 97  Temp: 96.3 F (35.7 C)  Resp: 20     Body mass index is  24.36 kg/(m^2).    ECOG FS:1 - Symptomatic but completely ambulatory  GENERAL: Patient is alert and oriented and in no acute distress. There is no icterus. HEENT: EOMs intact. Oral exam negative for thrush or lesions.  CVS: S1S2, regular LUNGS: Bilaterally clear to auscultation, no rhonchi. ABDOMEN: Soft, nontender. No hepatomegaly clinically.  NEURO: grossly nonfocal, cranial nerves are intact. Gait unremarkable. EXTREMITIES: No pedal edema.  LAB RESULTS:    Component Value Date/Time   NA 132* 04/03/2015 1327   K 4.7 04/03/2015 1327   CL 101 04/03/2015 1327   CO2 26 04/03/2015 1327   GLUCOSE 427* 04/03/2015 1327   BUN 22* 04/03/2015 1327   CREATININE 1.04 04/03/2015 1327   CALCIUM 8.5* 04/03/2015 1327   PROT 6.2* 04/03/2015 1327   ALBUMIN 3.6 04/03/2015 1327   AST 13* 04/03/2015 1327   ALT 12* 04/03/2015 1327   ALKPHOS 71 04/03/2015 1327   BILITOT 0.8 04/03/2015 1327   GFRNONAA >60 04/03/2015 1327   GFRAA >60 04/03/2015 1327   Lab Results  Component Value Date   WBC 5.9 04/03/2015   NEUTROABS 4.4 04/03/2015   HGB 14.6 04/03/2015   HCT 43.2 04/03/2015   MCV 92.1 04/03/2015   PLT 226 04/03/2015     STUDIES: 03/03/15 - PET Scan. IMPRESSION: 1. Hypermetabolic mass in the ascending colon consistent with neoplasm. 2. Hypermetabolic rectal mass consistent with neoplasm. There are small scattered perirectal lymph node without hypermetabolism but certainly suspicious for lymph node involvement. No enlarged or hypermetabolic nodes in the sigmoid mesocolon or retroperitoneum.Small scattered retroperitoneal lymph nodes are indeterminate but likely benign. 3. No findings for metastatic disease involving the neck or chest.  03/09/15 - Rectal mass biopsy. DIAGNOSIS: A. POSTERIOR RECTAL WALL; BIOPSY: TUBULOVILLOUS ADENOMA WITH ADENOCARCINOMA.  Note - The depth of invasion cannot be determined in this material. The results of this case were discussed with Dr. Bary Castilla on 03/13/15 by Dr.  Luana Shu. Multiple additional deeper HE stained sections were examined.   03/15/15 - Colonoscopy biopsy. DIAGNOSIS:  A. COLON POLYP, ASCENDING; COLD BIOPSY: - TUBULAR ADENOMA, MULTIPLE FRAGMENTS. NEGATIVE FOR HIGH-GRADE DYSPLASIA AND MALIGNANCY. B. COLON POLYP, HEPATIC FLEXURE; COLD BIOPSY: - VILLOUS ADENOMA, MULTIPLE FRAGMENTS. NEGATIVE FOR HIGH-GRADE DYSPLASIA AND MALIGNANCY. CLINICAL CORRELATION IS ADVISED WITH REGARD TO COMPLETENESS OF  REMOVAL.   STAGING: Rectal cancer   Staging form: Colon and Rectum, AJCC 7th Edition     Clinical stage from 04/09/2015: Stage IIIA (T2, N1, M0) - Signed by Leia Alf, MD on 04/09/2015   ASSESSMENT / PLAN:   1. Newly diagnosed adeno carcinoma rectum, clinical stage III (PET-positive Progressive Rectal Mass now protruding outside of the anus (colonoscopy 2013 had reported tubulovillous adenoma with high-grade dysplasia. Patient opted for no treatment at that time). 2. PET-positive Right Colon Mass (colonoscopy 2013 had reported tubulovillous adenoma without high-grade dysplasia) with colonoscopy and biopsy showing dysplastic polyp, also has numerous other polyps on recent colonoscopy and is planned for colectomy after chemoradiation for rectal cancer. Have reviewed biopsy report and d/w patient. Have independently reviewed PET scan and discussed findings with patient. Have explained to  him that based on radiological findings and biopsy, rectal adenoCa seems to be stage III disease and plan is concurrent chemoradiation. He was advised to be compliant to all appts, he is agreeable. Have again explained the rationale, benefits and possible side effects of concurrent 5-FU radiosensitizing chemotherapy, he is agreeable and expressed verbal consent to take this treatment. Will pursue 5FU infusion 225 mg/m2/day on days of the week when he gets radiation, starting today. Advised imodium as needed for diarrhea. Lab and chemo at 1 week, next MD f/u at 2 weeks with labs and  plan continued treatment.   3. Nutrition - encouraged to maintain increased protein-calorie intake. 4. Pain - he does not have any major pain issues or major bleeding issues at this time. 5. In between visits, the patient has been advised to call or come to the ER in case of fevers, bleeding, acute sickness or new symptoms. Patient is agreeable to this plan.     Leia Alf, MD   04/09/2015 10:45 AM

## 2015-04-10 ENCOUNTER — Ambulatory Visit: Payer: Medicare Other

## 2015-04-10 ENCOUNTER — Ambulatory Visit
Admission: RE | Admit: 2015-04-10 | Discharge: 2015-04-10 | Disposition: A | Discharge status: Home / Self Care | Payer: Medicare Other | Source: Ambulatory Visit | Attending: Radiation Oncology | Admitting: Radiation Oncology

## 2015-04-10 ENCOUNTER — Inpatient Hospital Stay: Payer: Medicare Other

## 2015-04-10 VITALS — BP 155/90 | HR 91 | Temp 97.2°F | Resp 16

## 2015-04-10 DIAGNOSIS — Z5111 Encounter for antineoplastic chemotherapy: Secondary | ICD-10-CM | POA: Diagnosis not present

## 2015-04-10 DIAGNOSIS — Z510 Encounter for antineoplastic radiation therapy: Secondary | ICD-10-CM | POA: Diagnosis not present

## 2015-04-10 DIAGNOSIS — C20 Malignant neoplasm of rectum: Secondary | ICD-10-CM

## 2015-04-10 LAB — CBC WITH DIFFERENTIAL/PLATELET
Basophils Absolute: 0 10*3/uL (ref 0–0.1)
Basophils Relative: 1 %
Eosinophils Absolute: 0.2 10*3/uL (ref 0–0.7)
Eosinophils Relative: 5 %
HCT: 41.3 % (ref 40.0–52.0)
Hemoglobin: 13.9 g/dL (ref 13.0–18.0)
Lymphocytes Relative: 15 %
Lymphs Abs: 0.7 10*3/uL — ABNORMAL LOW (ref 1.0–3.6)
MCH: 31.5 pg (ref 26.0–34.0)
MCHC: 33.7 g/dL (ref 32.0–36.0)
MCV: 93.4 fL (ref 80.0–100.0)
Monocytes Absolute: 0.4 10*3/uL (ref 0.2–1.0)
Monocytes Relative: 9 %
Neutro Abs: 3.4 10*3/uL (ref 1.4–6.5)
Neutrophils Relative %: 70 %
Platelets: 174 10*3/uL (ref 150–440)
RBC: 4.43 MIL/uL (ref 4.40–5.90)
RDW: 13.2 % (ref 11.5–14.5)
WBC: 4.9 10*3/uL (ref 3.8–10.6)

## 2015-04-10 MED ORDER — SODIUM CHLORIDE 0.9 % IV SOLN
225.0000 mg/m2/d | INTRAVENOUS | Status: DC
  Administered 2015-04-10: 2300 mg via INTRAVENOUS
  Filled 2015-04-10: qty 46

## 2015-04-11 ENCOUNTER — Ambulatory Visit
Admission: RE | Admit: 2015-04-11 | Discharge: 2015-04-11 | Disposition: A | Discharge status: Home / Self Care | Payer: Medicare Other | Source: Ambulatory Visit | Attending: Radiation Oncology | Admitting: Radiation Oncology

## 2015-04-11 DIAGNOSIS — Z510 Encounter for antineoplastic radiation therapy: Secondary | ICD-10-CM | POA: Diagnosis not present

## 2015-04-12 ENCOUNTER — Ambulatory Visit
Admission: RE | Admit: 2015-04-12 | Discharge: 2015-04-12 | Disposition: A | Discharge status: Home / Self Care | Payer: Medicare Other | Source: Ambulatory Visit | Attending: Radiation Oncology | Admitting: Radiation Oncology

## 2015-04-12 DIAGNOSIS — Z510 Encounter for antineoplastic radiation therapy: Secondary | ICD-10-CM | POA: Diagnosis not present

## 2015-04-13 ENCOUNTER — Ambulatory Visit
Admission: RE | Admit: 2015-04-13 | Discharge: 2015-04-13 | Disposition: A | Discharge status: Home / Self Care | Payer: Medicare Other | Source: Ambulatory Visit | Attending: Radiation Oncology | Admitting: Radiation Oncology

## 2015-04-13 DIAGNOSIS — Z510 Encounter for antineoplastic radiation therapy: Secondary | ICD-10-CM | POA: Diagnosis not present

## 2015-04-14 ENCOUNTER — Ambulatory Visit
Admission: RE | Admit: 2015-04-14 | Discharge: 2015-04-14 | Disposition: A | Discharge status: Home / Self Care | Payer: Medicare Other | Source: Ambulatory Visit | Attending: Radiation Oncology | Admitting: Radiation Oncology

## 2015-04-14 ENCOUNTER — Inpatient Hospital Stay: Payer: Medicare Other

## 2015-04-14 VITALS — BP 132/83 | HR 98 | Temp 96.5°F | Resp 18

## 2015-04-14 DIAGNOSIS — Z510 Encounter for antineoplastic radiation therapy: Secondary | ICD-10-CM | POA: Diagnosis not present

## 2015-04-14 DIAGNOSIS — C801 Malignant (primary) neoplasm, unspecified: Secondary | ICD-10-CM

## 2015-04-14 DIAGNOSIS — Z5111 Encounter for antineoplastic chemotherapy: Secondary | ICD-10-CM | POA: Diagnosis not present

## 2015-04-14 MED ORDER — HEPARIN SOD (PORK) LOCK FLUSH 100 UNIT/ML IV SOLN
500.0000 [IU] | Freq: Once | INTRAVENOUS | Status: AC
  Administered 2015-04-14: 500 [IU] via INTRAVENOUS
  Filled 2015-04-14: qty 5

## 2015-04-14 MED ORDER — SODIUM CHLORIDE 0.9 % IJ SOLN
10.0000 mL | Freq: Once | INTRAMUSCULAR | Status: AC
  Administered 2015-04-14: 10 mL via INTRAVENOUS
  Filled 2015-04-14: qty 10

## 2015-04-17 ENCOUNTER — Encounter: Payer: Self-pay | Admitting: Internal Medicine

## 2015-04-17 ENCOUNTER — Inpatient Hospital Stay: Payer: Medicare Other

## 2015-04-17 ENCOUNTER — Ambulatory Visit
Admission: RE | Admit: 2015-04-17 | Discharge: 2015-04-17 | Disposition: A | Discharge status: Home / Self Care | Payer: Medicare Other | Source: Ambulatory Visit | Attending: Radiation Oncology | Admitting: Radiation Oncology

## 2015-04-17 ENCOUNTER — Inpatient Hospital Stay (HOSPITAL_BASED_OUTPATIENT_CLINIC_OR_DEPARTMENT_OTHER): Payer: Medicare Other | Admitting: Internal Medicine

## 2015-04-17 VITALS — BP 165/84 | HR 85 | Temp 96.6°F | Resp 18 | Ht 72.0 in | Wt 179.0 lb

## 2015-04-17 DIAGNOSIS — K635 Polyp of colon: Secondary | ICD-10-CM

## 2015-04-17 DIAGNOSIS — E119 Type 2 diabetes mellitus without complications: Secondary | ICD-10-CM

## 2015-04-17 DIAGNOSIS — C20 Malignant neoplasm of rectum: Secondary | ICD-10-CM | POA: Diagnosis not present

## 2015-04-17 DIAGNOSIS — Z5111 Encounter for antineoplastic chemotherapy: Secondary | ICD-10-CM | POA: Diagnosis not present

## 2015-04-17 DIAGNOSIS — Z87891 Personal history of nicotine dependence: Secondary | ICD-10-CM

## 2015-04-17 DIAGNOSIS — Z79899 Other long term (current) drug therapy: Secondary | ICD-10-CM | POA: Diagnosis not present

## 2015-04-17 DIAGNOSIS — Z808 Family history of malignant neoplasm of other organs or systems: Secondary | ICD-10-CM

## 2015-04-17 DIAGNOSIS — Z510 Encounter for antineoplastic radiation therapy: Secondary | ICD-10-CM | POA: Diagnosis not present

## 2015-04-17 LAB — CBC WITH DIFFERENTIAL/PLATELET
Basophils Absolute: 0 10*3/uL (ref 0–0.1)
Basophils Relative: 1 %
EOS PCT: 5 %
Eosinophils Absolute: 0.2 10*3/uL (ref 0–0.7)
HEMATOCRIT: 41 % (ref 40.0–52.0)
HEMOGLOBIN: 14 g/dL (ref 13.0–18.0)
LYMPHS ABS: 0.5 10*3/uL — AB (ref 1.0–3.6)
LYMPHS PCT: 13 %
MCH: 31.6 pg (ref 26.0–34.0)
MCHC: 34.2 g/dL (ref 32.0–36.0)
MCV: 92.5 fL (ref 80.0–100.0)
MONO ABS: 0.3 10*3/uL (ref 0.2–1.0)
MONOS PCT: 8 %
Neutro Abs: 3.1 10*3/uL (ref 1.4–6.5)
Neutrophils Relative %: 73 %
Platelets: 155 10*3/uL (ref 150–440)
RBC: 4.43 MIL/uL (ref 4.40–5.90)
RDW: 13.5 % (ref 11.5–14.5)
WBC: 4.2 10*3/uL (ref 3.8–10.6)

## 2015-04-17 LAB — HEPATIC FUNCTION PANEL
ALK PHOS: 64 U/L (ref 38–126)
ALT: 14 U/L — ABNORMAL LOW (ref 17–63)
AST: 14 U/L — ABNORMAL LOW (ref 15–41)
Albumin: 3.5 g/dL (ref 3.5–5.0)
Bilirubin, Direct: <0.1 mg/dL — ABNORMAL LOW (ref 0.1–0.5)
Total Bilirubin: 0.6 mg/dL (ref 0.3–1.2)
Total Protein: 6.3 g/dL — ABNORMAL LOW (ref 6.5–8.1)

## 2015-04-17 LAB — CREATININE, SERUM
Creatinine, Ser: 0.92 mg/dL (ref 0.61–1.24)
GFR calc Af Amer: >60 mL/min (ref >60)
GFR calc non Af Amer: >60 mL/min (ref >60)

## 2015-04-17 MED ORDER — HEPARIN SOD (PORK) LOCK FLUSH 100 UNIT/ML IV SOLN: 500.0000 [IU] | Freq: Once | INTRAVENOUS | Status: DC | PRN

## 2015-04-17 MED ORDER — SODIUM CHLORIDE 0.9 % IJ SOLN
10.0000 mL | INTRAMUSCULAR | Status: DC | PRN
Start: 2015-04-17 — End: 2015-04-17
  Administered 2015-04-17: 10 mL
  Filled 2015-04-17: qty 10

## 2015-04-17 MED ORDER — SODIUM CHLORIDE 0.9 % IV SOLN
225.0000 mg/m2/d | INTRAVENOUS | Status: DC
  Administered 2015-04-17: 2300 mg via INTRAVENOUS
  Filled 2015-04-17: qty 46

## 2015-04-18 ENCOUNTER — Inpatient Hospital Stay: Payer: Medicare Other

## 2015-04-18 ENCOUNTER — Ambulatory Visit
Admission: RE | Admit: 2015-04-18 | Discharge: 2015-04-18 | Disposition: A | Discharge status: Home / Self Care | Payer: Medicare Other | Source: Ambulatory Visit | Attending: Radiation Oncology | Admitting: Radiation Oncology

## 2015-04-18 ENCOUNTER — Inpatient Hospital Stay: Payer: Medicare Other | Admitting: Internal Medicine

## 2015-04-18 DIAGNOSIS — Z510 Encounter for antineoplastic radiation therapy: Secondary | ICD-10-CM | POA: Diagnosis not present

## 2015-04-19 ENCOUNTER — Ambulatory Visit: Payer: Medicare Other

## 2015-04-20 ENCOUNTER — Ambulatory Visit: Payer: Medicare Other

## 2015-04-21 ENCOUNTER — Ambulatory Visit: Payer: Medicare Other

## 2015-04-21 ENCOUNTER — Ambulatory Visit: Admission: RE | Admit: 2015-04-21 | Payer: Medicare Other | Source: Ambulatory Visit

## 2015-04-21 ENCOUNTER — Inpatient Hospital Stay: Payer: Medicare Other

## 2015-04-21 ENCOUNTER — Ambulatory Visit
Admission: RE | Admit: 2015-04-21 | Discharge: 2015-04-21 | Disposition: A | Discharge status: Home / Self Care | Payer: Medicare Other | Source: Ambulatory Visit | Attending: Radiation Oncology | Admitting: Radiation Oncology

## 2015-04-21 VITALS — BP 108/61 | HR 90 | Temp 98.0°F | Resp 18

## 2015-04-21 DIAGNOSIS — C801 Malignant (primary) neoplasm, unspecified: Secondary | ICD-10-CM

## 2015-04-21 DIAGNOSIS — Z5111 Encounter for antineoplastic chemotherapy: Secondary | ICD-10-CM | POA: Diagnosis not present

## 2015-04-21 MED ORDER — HEPARIN SOD (PORK) LOCK FLUSH 100 UNIT/ML IV SOLN
500.0000 [IU] | INTRAVENOUS | Status: AC | PRN
  Administered 2015-04-21: 500 [IU]
  Filled 2015-04-21: qty 5

## 2015-04-21 MED ORDER — SODIUM CHLORIDE 0.9 % IJ SOLN
10.0000 mL | INTRAMUSCULAR | Status: AC | PRN
  Administered 2015-04-21: 10 mL
  Filled 2015-04-21: qty 10

## 2015-04-24 ENCOUNTER — Inpatient Hospital Stay: Payer: Medicare Other

## 2015-04-24 ENCOUNTER — Ambulatory Visit
Admission: RE | Admit: 2015-04-24 | Discharge: 2015-04-24 | Disposition: A | Discharge status: Home / Self Care | Payer: Medicare Other | Source: Ambulatory Visit | Attending: Radiation Oncology | Admitting: Radiation Oncology

## 2015-04-24 ENCOUNTER — Ambulatory Visit: Payer: Medicare Other | Admitting: Radiation Oncology

## 2015-04-24 VITALS — BP 162/83 | HR 83 | Temp 97.0°F | Resp 18

## 2015-04-24 DIAGNOSIS — C20 Malignant neoplasm of rectum: Secondary | ICD-10-CM

## 2015-04-24 DIAGNOSIS — Z510 Encounter for antineoplastic radiation therapy: Secondary | ICD-10-CM | POA: Diagnosis not present

## 2015-04-24 DIAGNOSIS — Z5111 Encounter for antineoplastic chemotherapy: Secondary | ICD-10-CM | POA: Diagnosis not present

## 2015-04-24 LAB — CBC WITH DIFFERENTIAL/PLATELET
BASOS ABS: 0 10*3/uL (ref 0–0.1)
Basophils Relative: 1 %
EOS ABS: 0.2 10*3/uL (ref 0–0.7)
EOS PCT: 4 %
HCT: 40.1 % (ref 40.0–52.0)
Hemoglobin: 13.7 g/dL (ref 13.0–18.0)
LYMPHS PCT: 9 %
Lymphs Abs: 0.4 10*3/uL — ABNORMAL LOW (ref 1.0–3.6)
MCH: 31.9 pg (ref 26.0–34.0)
MCHC: 34.2 g/dL (ref 32.0–36.0)
MCV: 93.5 fL (ref 80.0–100.0)
Monocytes Absolute: 0.4 10*3/uL (ref 0.2–1.0)
Monocytes Relative: 8 %
NEUTROS PCT: 78 %
Neutro Abs: 3.5 10*3/uL (ref 1.4–6.5)
PLATELETS: 158 10*3/uL (ref 150–440)
RBC: 4.29 MIL/uL — ABNORMAL LOW (ref 4.40–5.90)
RDW: 14.5 % (ref 11.5–14.5)
WBC: 4.5 10*3/uL (ref 3.8–10.6)

## 2015-04-24 MED ORDER — SODIUM CHLORIDE 0.9 % IJ SOLN
10.0000 mL | INTRAMUSCULAR | Status: DC | PRN
  Administered 2015-04-24: 10 mL
  Filled 2015-04-24: qty 10

## 2015-04-24 MED ORDER — SODIUM CHLORIDE 0.9 % IV SOLN
225.0000 mg/m2/d | INTRAVENOUS | Status: DC
  Administered 2015-04-24: 1850 mg via INTRAVENOUS
  Filled 2015-04-24: qty 37

## 2015-04-25 ENCOUNTER — Ambulatory Visit: Payer: Medicare Other | Admitting: Radiation Oncology

## 2015-04-25 ENCOUNTER — Ambulatory Visit
Admission: RE | Admit: 2015-04-25 | Discharge: 2015-04-25 | Disposition: A | Discharge status: Home / Self Care | Payer: Medicare Other | Source: Ambulatory Visit | Attending: Radiation Oncology | Admitting: Radiation Oncology

## 2015-04-25 DIAGNOSIS — Z510 Encounter for antineoplastic radiation therapy: Secondary | ICD-10-CM | POA: Diagnosis not present

## 2015-04-26 ENCOUNTER — Ambulatory Visit
Admission: RE | Admit: 2015-04-26 | Discharge: 2015-04-26 | Disposition: A | Discharge status: Home / Self Care | Payer: Medicare Other | Source: Ambulatory Visit | Attending: Radiation Oncology | Admitting: Radiation Oncology

## 2015-04-26 DIAGNOSIS — Z510 Encounter for antineoplastic radiation therapy: Secondary | ICD-10-CM | POA: Diagnosis not present

## 2015-04-27 ENCOUNTER — Ambulatory Visit: Payer: Medicare Other

## 2015-04-27 ENCOUNTER — Ambulatory Visit: Payer: Medicare Other | Admitting: Radiation Oncology

## 2015-04-27 ENCOUNTER — Ambulatory Visit
Admission: RE | Admit: 2015-04-27 | Discharge: 2015-04-27 | Disposition: A | Discharge status: Home / Self Care | Payer: Medicare Other | Source: Ambulatory Visit | Attending: Radiation Oncology | Admitting: Radiation Oncology

## 2015-04-27 NOTE — Progress Notes (Signed)
[place]  Telephone:(336) [phone] Fax:(336) [phone]     ID: Gary Hampton OB: 1941-08-27  MR#: [id number]  BBC#:[id number]  Patient Care Team: Albina Billet, MD as PCP - General (Internal Medicine) Albina Billet, MD (Internal Medicine) Robert Bellow, MD (General Surgery) Clent Jacks, RN as Registered Nurse  CHIEF COMPLAINT/DIAGNOSIS:  1. Adenocarcinoma rectum, clinical stage III (PET-positive Progressive Rectal Mass now protruding outside of the anus (colonoscopy 2013 had reported tubulovillous adenoma with high-grade dysplasia. Patient opted for no treatment at that time). 2. PET-positive Right Colon Mass (colonoscopy 2013 had reported tubulovillous adenoma without high-grade dysplasia) with colonoscopy and biopsy showing dysplastic polyp, also has numerous other polyps on recent colonoscopy and is planned for colectomy after chemoradiation for rectal cancer.  HISTORY OF PRESENT ILLNESS:  Patient returns for oncology followup and to continue CIV 5FU infusion chemotherapy. States he is tolerating chemo fairly well but has intermittent rectal area pain/discomfort since starting radiation, he avoids taking pain medications and feels pain is tolerable. Appetite and weight have been steady. No fevers. Denies any new cough chest pain dyspnea or hemoptysis. No new bone pains. States that he is physically active and cares for himself since he lives alone. Mild diarrhea, denies new BRBPR. No nausea or vomiting. No other new pain issues, 0/10.  REVIEW OF SYSTEMS:   ROS As in HPI above. In addition, no fever, chills. No new headaches or focal weakness.  No new mood disturbances. No  sore throat or dysphagia. No dizziness or palpitation. No abdominal pain, constipation, diarrhea, dysuria or hematuria. No new skin rash or bleeding symptoms. No new paresthesias in extremities. No polyuria polydipsia. PS ECOG 1.  PAST MEDICAL HISTORY: Reviewed Past Medical History  Diagnosis  Date  . Diabetes mellitus without complication   . Colon polyp   . Hard of hearing   . Cancer     Rectal   PAST SURGICAL HISTORY:Reviewed Past Surgical History  Procedure Laterality Date  . Colonoscopy  2013    Dr. Dionne Milo  . Cataract extraction Right   . No past surgeries    . Portacath placement Right 03/09/2015    Procedure: INSERTION PORT-A-CATH;  Surgeon: Robert Bellow, MD;  Location: [facility] ORS;  Service: General;  Laterality: Right;  . Flexible sigmoidoscopy N/A 03/09/2015    Procedure: FLEXIBLE SIGMOIDOSCOPY/rectal biopsy;  Surgeon: Robert Bellow, MD;  Location: [facility] ORS;  Service: General;  Laterality: N/A;  . Colonoscopy N/A 03/15/2015    Procedure: COLONOSCOPY;  Surgeon: Robert Bellow, MD;  Location: [facility] ENDOSCOPY;  Service: Endoscopy;  Laterality: N/A;    FAMILY HISTORY:Reviewed Remarkable for breast cancer, melanoma. Denies GI malignancies.  ADVANCED DIRECTIVES:  <no information>  SOCIAL HISTORY: Reviewed History  Substance Use Topics  . Smoking status: Former Smoker -- 1.00 packs/day for 3 years    Types: Cigarettes, Pipe, Cigars    Quit date: 10/28/1978  . Smokeless tobacco: Never Used  . Alcohol Use: 0.0 oz/week    0 Standard drinks or equivalent per week     Comment: 3/day    No Known Allergies  Current Outpatient Prescriptions  Medication Sig Dispense Refill  . INSULIN ASPART [place] Inject 18 Units into the skin.    Marland Kitchen lidocaine-prilocaine (EMLA) cream Small amount of cream 1 hour before chemotherapy treatment 30 g 0  . loperamide (IMODIUM A-D) 2 MG tablet Take 2 mg by mouth 4 (four) times daily as needed for diarrhea or loose stools.    . promethazine (  PHENERGAN) 25 MG tablet Take 1 tablet (25 mg total) by mouth every 6 (six) hours as needed for nausea or vomiting. 30 tablet 1   No current facility-administered medications for this visit.    OBJECTIVE: Filed Vitals:   04/17/15 1514  BP: 165/84  Pulse: 85  Temp: 96.6 F (35.9 C)  Resp:  18     Body mass index is 24.27 kg/(m^2).    ECOG FS:1 - Symptomatic but completely ambulatory  GENERAL: Alert and oriented and in no acute distress. No icterus. HEENT: EOMs intact. Oral exam negative for thrush or mucositis.  CVS: S1S2, regular LUNGS: Bilaterally clear to auscultation, no rhonchi. ABDOMEN: Soft, nontender.    NEURO: grossly nonfocal, cranial nerves are intact. Gait unremarkable. EXTREMITIES: No pedal edema.  LAB RESULTS: Hb 14, WBC 4.2, plts 155, ANC 3.1.     Component Value Date/Time   NA 132* 04/03/2015 1327   K 4.7 04/03/2015 1327   CL 101 04/03/2015 1327   CO2 26 04/03/2015 1327   GLUCOSE 427* 04/03/2015 1327   BUN 22* 04/03/2015 1327   CREATININE 0.92 04/17/2015 1434   CALCIUM 8.5* 04/03/2015 1327   PROT 6.3* 04/17/2015 1434   ALBUMIN 3.5 04/17/2015 1434   AST 14* 04/17/2015 1434   ALT 14* 04/17/2015 1434   ALKPHOS 64 04/17/2015 1434   BILITOT 0.6 04/17/2015 1434   GFRNONAA >60 04/17/2015 1434   GFRAA >60 04/17/2015 1434    STUDIES: 03/03/15 - PET Scan. IMPRESSION: 1. Hypermetabolic mass in the ascending colon consistent with neoplasm. 2. Hypermetabolic rectal mass consistent with neoplasm. There are small scattered perirectal lymph node without hypermetabolism but certainly suspicious for lymph node involvement. No enlarged or hypermetabolic nodes in the sigmoid mesocolon or retroperitoneum.Small scattered retroperitoneal lymph nodes are indeterminate but likely benign. 3. No findings for metastatic disease involving the neck or chest.  03/09/15 - Rectal mass biopsy. DIAGNOSIS: A. POSTERIOR RECTAL WALL; BIOPSY: TUBULOVILLOUS ADENOMA WITH ADENOCARCINOMA.  Note - The depth of invasion cannot be determined in this material. The results of this case were discussed with Dr. Bary Castilla on 03/13/15 by Dr. Luana Shu. Multiple additional deeper HE stained sections were examined.   03/15/15 - Colonoscopy biopsy. DIAGNOSIS:  A. COLON POLYP, ASCENDING; COLD BIOPSY: - TUBULAR  ADENOMA, MULTIPLE FRAGMENTS. NEGATIVE FOR HIGH-GRADE DYSPLASIA AND MALIGNANCY. B. COLON POLYP, HEPATIC FLEXURE; COLD BIOPSY: - VILLOUS ADENOMA, MULTIPLE FRAGMENTS. NEGATIVE FOR HIGH-GRADE DYSPLASIA AND MALIGNANCY. CLINICAL CORRELATION IS ADVISED WITH REGARD TO COMPLETENESS OF  REMOVAL.   STAGING: Rectal cancer   Staging form: Colon and Rectum, AJCC 7th Edition     Clinical stage from 04/09/2015: Stage IIIA (T2, N1, M0) - Signed by Leia Alf, MD on 04/09/2015   ASSESSMENT / PLAN:   1. Adenocarcinoma rectum, clinical stage III (PET-positive Progressive Rectal Mass now protruding outside of the anus (colonoscopy 2013 had reported tubulovillous adenoma with high-grade dysplasia. Patient opted for no treatment at that time). 2. PET-positive Right Colon Mass (colonoscopy 2013 had reported tubulovillous adenoma without high-grade dysplasia) with colonoscopy and biopsy showing dysplastic polyp, also has numerous other polyps on recent colonoscopy and is planned for colectomy after chemoradiation for rectal cancer. Have reviewed labs from today and discussed with patient. Have explained also that he needs to be compliant with taking chemo on schedule, he is agreeable and states he will try his best, and is agreeable to continue on concurrent 5-FU radiosensitizing chemotherapy. Will pursue next dose of 5FU infusion 225 mg/m2/day on days of the week when he  gets radiation, starting today. Advised imodium as needed for diarrhea. Lab and chemo at 1 week, next MD f/u at 2 weeks (on July 5) with labs and plan continued treatment.   3. Nutrition - encouraged to maintain increased protein-calorie intake. 4. Pain - encouraged him to take pain medication if needed.  5. In between visits, the patient has been advised to call or come to the ER in case of fevers, bleeding, acute sickness or new symptoms. Patient is agreeable to this plan.     Leia Alf, MD   04/27/2015 6:21 AM

## 2015-04-28 ENCOUNTER — Inpatient Hospital Stay: Payer: Medicare Other | Attending: Internal Medicine

## 2015-04-28 ENCOUNTER — Ambulatory Visit
Admission: RE | Admit: 2015-04-28 | Discharge: 2015-04-28 | Disposition: A | Discharge status: Home / Self Care | Payer: Medicare Other | Source: Ambulatory Visit | Attending: Radiation Oncology | Admitting: Radiation Oncology

## 2015-04-28 DIAGNOSIS — Z87891 Personal history of nicotine dependence: Secondary | ICD-10-CM | POA: Diagnosis not present

## 2015-04-28 DIAGNOSIS — H9190 Unspecified hearing loss, unspecified ear: Secondary | ICD-10-CM | POA: Diagnosis not present

## 2015-04-28 DIAGNOSIS — Z794 Long term (current) use of insulin: Secondary | ICD-10-CM | POA: Diagnosis not present

## 2015-04-28 DIAGNOSIS — Z809 Family history of malignant neoplasm, unspecified: Secondary | ICD-10-CM | POA: Diagnosis not present

## 2015-04-28 DIAGNOSIS — C20 Malignant neoplasm of rectum: Secondary | ICD-10-CM | POA: Insufficient documentation

## 2015-04-28 DIAGNOSIS — E119 Type 2 diabetes mellitus without complications: Secondary | ICD-10-CM | POA: Insufficient documentation

## 2015-04-28 DIAGNOSIS — C801 Malignant (primary) neoplasm, unspecified: Secondary | ICD-10-CM

## 2015-04-28 DIAGNOSIS — Z5111 Encounter for antineoplastic chemotherapy: Secondary | ICD-10-CM | POA: Diagnosis not present

## 2015-04-28 DIAGNOSIS — Z510 Encounter for antineoplastic radiation therapy: Secondary | ICD-10-CM | POA: Diagnosis not present

## 2015-04-28 MED ORDER — HEPARIN SOD (PORK) LOCK FLUSH 100 UNIT/ML IV SOLN
500.0000 [IU] | Freq: Once | INTRAVENOUS | Status: AC
  Administered 2015-04-28: 500 [IU] via INTRAVENOUS

## 2015-04-28 MED ORDER — SODIUM CHLORIDE 0.9 % IJ SOLN
10.0000 mL | Freq: Once | INTRAMUSCULAR | Status: AC
  Administered 2015-04-28: 10 mL via INTRAVENOUS
  Filled 2015-04-28: qty 10

## 2015-05-02 ENCOUNTER — Telehealth: Payer: Self-pay | Admitting: Pharmacist

## 2015-05-02 ENCOUNTER — Inpatient Hospital Stay: Payer: Medicare Other

## 2015-05-02 ENCOUNTER — Ambulatory Visit
Admission: RE | Admit: 2015-05-02 | Discharge: 2015-05-02 | Disposition: A | Discharge status: Home / Self Care | Payer: Medicare Other | Source: Ambulatory Visit | Attending: Radiation Oncology | Admitting: Radiation Oncology

## 2015-05-02 ENCOUNTER — Inpatient Hospital Stay (HOSPITAL_BASED_OUTPATIENT_CLINIC_OR_DEPARTMENT_OTHER): Payer: Medicare Other | Admitting: Internal Medicine

## 2015-05-02 VITALS — BP 163/81 | HR 88 | Temp 96.9°F | Resp 18 | Ht 72.0 in | Wt 179.9 lb

## 2015-05-02 DIAGNOSIS — H9190 Unspecified hearing loss, unspecified ear: Secondary | ICD-10-CM | POA: Diagnosis not present

## 2015-05-02 DIAGNOSIS — C20 Malignant neoplasm of rectum: Secondary | ICD-10-CM

## 2015-05-02 DIAGNOSIS — Z510 Encounter for antineoplastic radiation therapy: Secondary | ICD-10-CM | POA: Diagnosis not present

## 2015-05-02 DIAGNOSIS — Z794 Long term (current) use of insulin: Secondary | ICD-10-CM

## 2015-05-02 DIAGNOSIS — Z809 Family history of malignant neoplasm, unspecified: Secondary | ICD-10-CM

## 2015-05-02 DIAGNOSIS — E119 Type 2 diabetes mellitus without complications: Secondary | ICD-10-CM | POA: Diagnosis not present

## 2015-05-02 DIAGNOSIS — Z5111 Encounter for antineoplastic chemotherapy: Secondary | ICD-10-CM | POA: Diagnosis not present

## 2015-05-02 DIAGNOSIS — Z87891 Personal history of nicotine dependence: Secondary | ICD-10-CM

## 2015-05-02 LAB — HEPATIC FUNCTION PANEL
ALBUMIN: 3.7 g/dL (ref 3.5–5.0)
ALK PHOS: 57 U/L (ref 38–126)
ALT: 14 U/L — AB (ref 17–63)
AST: 17 U/L (ref 15–41)
BILIRUBIN DIRECT: 0.1 mg/dL (ref 0.1–0.5)
BILIRUBIN INDIRECT: 0.7 mg/dL (ref 0.3–0.9)
BILIRUBIN TOTAL: 0.8 mg/dL (ref 0.3–1.2)
TOTAL PROTEIN: 6.5 g/dL (ref 6.5–8.1)

## 2015-05-02 LAB — CBC WITH DIFFERENTIAL/PLATELET
Basophils Absolute: 0 10*3/uL (ref 0–0.1)
Basophils Relative: 1 %
EOS ABS: 0.1 10*3/uL (ref 0–0.7)
Eosinophils Relative: 3 %
HCT: 41.9 % (ref 40.0–52.0)
HEMOGLOBIN: 14.1 g/dL (ref 13.0–18.0)
LYMPHS ABS: 0.6 10*3/uL — AB (ref 1.0–3.6)
Lymphocytes Relative: 11 %
MCH: 32 pg (ref 26.0–34.0)
MCHC: 33.8 g/dL (ref 32.0–36.0)
MCV: 94.7 fL (ref 80.0–100.0)
Monocytes Absolute: 0.6 10*3/uL (ref 0.2–1.0)
Monocytes Relative: 11 %
NEUTROS ABS: 3.9 10*3/uL (ref 1.4–6.5)
Neutrophils Relative %: 76 %
PLATELETS: 208 10*3/uL (ref 150–440)
RBC: 4.42 MIL/uL (ref 4.40–5.90)
RDW: 16.3 % — ABNORMAL HIGH (ref 11.5–14.5)
WBC: 5.2 10*3/uL (ref 3.8–10.6)

## 2015-05-02 LAB — CREATININE, SERUM
CREATININE: 0.97 mg/dL (ref 0.61–1.24)
GFR calc Af Amer: >60 mL/min (ref >60)
GFR calc non Af Amer: >60 mL/min (ref >60)

## 2015-05-02 MED ORDER — SODIUM CHLORIDE 0.9 % IV SOLN
1850.0000 mg | INTRAVENOUS | Status: DC
  Administered 2015-05-02: 1850 mg via INTRAVENOUS
  Filled 2015-05-02: qty 37

## 2015-05-02 MED ORDER — SODIUM CHLORIDE 0.9 % IV SOLN
Freq: Once | INTRAVENOUS | Status: DC
  Filled 2015-05-02: qty 1000

## 2015-05-02 MED ORDER — SODIUM CHLORIDE 0.9 % IJ SOLN
10.0000 mL | INTRAMUSCULAR | Status: DC | PRN
  Administered 2015-05-02: 10 mL
  Filled 2015-05-02: qty 10

## 2015-05-02 NOTE — Telephone Encounter (Signed)
225mg /m2 for 4 days (1850mg  total). Because patient will not return to clinic on Saturday, the pump will be removed on Friday afternoon.

## 2015-05-03 ENCOUNTER — Ambulatory Visit
Admission: RE | Admit: 2015-05-03 | Discharge: 2015-05-03 | Disposition: A | Discharge status: Home / Self Care | Payer: Medicare Other | Source: Ambulatory Visit | Attending: Radiation Oncology | Admitting: Radiation Oncology

## 2015-05-03 DIAGNOSIS — Z510 Encounter for antineoplastic radiation therapy: Secondary | ICD-10-CM | POA: Diagnosis not present

## 2015-05-04 ENCOUNTER — Ambulatory Visit
Admission: RE | Admit: 2015-05-04 | Discharge: 2015-05-04 | Disposition: A | Discharge status: Home / Self Care | Payer: Medicare Other | Source: Ambulatory Visit | Attending: Radiation Oncology | Admitting: Radiation Oncology

## 2015-05-04 DIAGNOSIS — Z510 Encounter for antineoplastic radiation therapy: Secondary | ICD-10-CM | POA: Diagnosis not present

## 2015-05-05 ENCOUNTER — Ambulatory Visit
Admission: RE | Admit: 2015-05-05 | Discharge: 2015-05-05 | Disposition: A | Discharge status: Home / Self Care | Payer: Medicare Other | Source: Ambulatory Visit | Attending: Radiation Oncology | Admitting: Radiation Oncology

## 2015-05-05 ENCOUNTER — Inpatient Hospital Stay: Payer: Medicare Other

## 2015-05-05 VITALS — BP 138/82 | HR 82 | Resp 18

## 2015-05-05 DIAGNOSIS — Z5111 Encounter for antineoplastic chemotherapy: Secondary | ICD-10-CM | POA: Diagnosis not present

## 2015-05-05 DIAGNOSIS — Z510 Encounter for antineoplastic radiation therapy: Secondary | ICD-10-CM | POA: Diagnosis not present

## 2015-05-05 DIAGNOSIS — C801 Malignant (primary) neoplasm, unspecified: Secondary | ICD-10-CM

## 2015-05-05 MED ORDER — HEPARIN SOD (PORK) LOCK FLUSH 100 UNIT/ML IV SOLN
INTRAVENOUS | Status: AC
  Filled 2015-05-05: qty 5

## 2015-05-05 MED ORDER — HEPARIN SOD (PORK) LOCK FLUSH 100 UNIT/ML IV SOLN
500.0000 [IU] | Freq: Once | INTRAVENOUS | Status: AC
  Administered 2015-05-05: 500 [IU] via INTRAVENOUS

## 2015-05-05 MED ORDER — SODIUM CHLORIDE 0.9 % IJ SOLN
10.0000 mL | Freq: Once | INTRAMUSCULAR | Status: AC
  Administered 2015-05-05: 10 mL via INTRAVENOUS
  Filled 2015-05-05: qty 10

## 2015-05-08 ENCOUNTER — Inpatient Hospital Stay: Payer: Medicare Other

## 2015-05-08 ENCOUNTER — Ambulatory Visit
Admission: RE | Admit: 2015-05-08 | Discharge: 2015-05-08 | Disposition: A | Discharge status: Home / Self Care | Payer: Medicare Other | Source: Ambulatory Visit | Attending: Radiation Oncology | Admitting: Radiation Oncology

## 2015-05-08 ENCOUNTER — Other Ambulatory Visit: Payer: Medicare Other

## 2015-05-08 VITALS — BP 150/64 | HR 88 | Temp 96.9°F | Resp 18

## 2015-05-08 DIAGNOSIS — Z510 Encounter for antineoplastic radiation therapy: Secondary | ICD-10-CM | POA: Diagnosis not present

## 2015-05-08 DIAGNOSIS — C20 Malignant neoplasm of rectum: Secondary | ICD-10-CM

## 2015-05-08 DIAGNOSIS — Z5111 Encounter for antineoplastic chemotherapy: Secondary | ICD-10-CM | POA: Diagnosis not present

## 2015-05-08 LAB — CBC WITH DIFFERENTIAL/PLATELET
BASOS ABS: 0 10*3/uL (ref 0–0.1)
Basophils Relative: 1 %
EOS PCT: 5 %
Eosinophils Absolute: 0.2 10*3/uL (ref 0–0.7)
HCT: 41.2 % (ref 40.0–52.0)
HEMOGLOBIN: 14 g/dL (ref 13.0–18.0)
LYMPHS ABS: 0.5 10*3/uL — AB (ref 1.0–3.6)
Lymphocytes Relative: 11 %
MCH: 32.6 pg (ref 26.0–34.0)
MCHC: 34 g/dL (ref 32.0–36.0)
MCV: 95.8 fL (ref 80.0–100.0)
MONOS PCT: 10 %
Monocytes Absolute: 0.4 10*3/uL (ref 0.2–1.0)
Neutro Abs: 3.3 10*3/uL (ref 1.4–6.5)
Neutrophils Relative %: 73 %
PLATELETS: 184 10*3/uL (ref 150–440)
RBC: 4.3 MIL/uL — ABNORMAL LOW (ref 4.40–5.90)
RDW: 16.7 % — AB (ref 11.5–14.5)
WBC: 4.4 10*3/uL (ref 3.8–10.6)

## 2015-05-08 LAB — CREATININE, SERUM
Creatinine, Ser: 1.02 mg/dL (ref 0.61–1.24)
GFR calc Af Amer: >60 mL/min (ref >60)
GFR calc non Af Amer: >60 mL/min (ref >60)

## 2015-05-08 MED ORDER — SODIUM CHLORIDE 0.9 % IV SOLN
2300.0000 mg | INTRAVENOUS | Status: DC
  Administered 2015-05-08: 2300 mg via INTRAVENOUS
  Filled 2015-05-08: qty 46

## 2015-05-08 MED ORDER — SODIUM CHLORIDE 0.9 % IJ SOLN
10.0000 mL | INTRAMUSCULAR | Status: DC | PRN
  Administered 2015-05-08: 10 mL
  Filled 2015-05-08: qty 10

## 2015-05-08 MED ORDER — SODIUM CHLORIDE 0.9 % IV SOLN: 225.0000 mg/m2/d | INTRAVENOUS | Status: DC

## 2015-05-09 ENCOUNTER — Ambulatory Visit
Admission: RE | Admit: 2015-05-09 | Discharge: 2015-05-09 | Disposition: A | Discharge status: Home / Self Care | Payer: Medicare Other | Source: Ambulatory Visit | Attending: Radiation Oncology | Admitting: Radiation Oncology

## 2015-05-09 ENCOUNTER — Ambulatory Visit: Admission: RE | Admit: 2015-05-09 | Payer: Medicare Other | Source: Ambulatory Visit

## 2015-05-09 DIAGNOSIS — Z510 Encounter for antineoplastic radiation therapy: Secondary | ICD-10-CM | POA: Diagnosis not present

## 2015-05-10 ENCOUNTER — Ambulatory Visit
Admission: RE | Admit: 2015-05-10 | Discharge: 2015-05-10 | Disposition: A | Discharge status: Home / Self Care | Payer: Medicare Other | Source: Ambulatory Visit | Attending: Radiation Oncology | Admitting: Radiation Oncology

## 2015-05-10 DIAGNOSIS — Z510 Encounter for antineoplastic radiation therapy: Secondary | ICD-10-CM | POA: Diagnosis not present

## 2015-05-11 ENCOUNTER — Ambulatory Visit
Admission: RE | Admit: 2015-05-11 | Discharge: 2015-05-11 | Disposition: A | Discharge status: Home / Self Care | Payer: Medicare Other | Source: Ambulatory Visit | Attending: Radiation Oncology | Admitting: Radiation Oncology

## 2015-05-11 DIAGNOSIS — Z510 Encounter for antineoplastic radiation therapy: Secondary | ICD-10-CM | POA: Diagnosis not present

## 2015-05-12 ENCOUNTER — Ambulatory Visit
Admission: RE | Admit: 2015-05-12 | Discharge: 2015-05-12 | Disposition: A | Discharge status: Home / Self Care | Payer: Medicare Other | Source: Ambulatory Visit | Attending: Radiation Oncology | Admitting: Radiation Oncology

## 2015-05-12 ENCOUNTER — Inpatient Hospital Stay: Payer: Medicare Other

## 2015-05-12 ENCOUNTER — Ambulatory Visit: Admission: RE | Admit: 2015-05-12 | Payer: Medicare Other | Source: Ambulatory Visit

## 2015-05-12 VITALS — BP 159/80 | HR 85 | Temp 96.5°F | Resp 20

## 2015-05-12 DIAGNOSIS — C801 Malignant (primary) neoplasm, unspecified: Secondary | ICD-10-CM

## 2015-05-12 DIAGNOSIS — Z5111 Encounter for antineoplastic chemotherapy: Secondary | ICD-10-CM | POA: Diagnosis not present

## 2015-05-12 DIAGNOSIS — Z510 Encounter for antineoplastic radiation therapy: Secondary | ICD-10-CM | POA: Diagnosis not present

## 2015-05-12 MED ORDER — HEPARIN SOD (PORK) LOCK FLUSH 100 UNIT/ML IV SOLN
INTRAVENOUS | Status: AC
  Filled 2015-05-12: qty 5

## 2015-05-12 MED ORDER — SODIUM CHLORIDE 0.9 % IJ SOLN
10.0000 mL | INTRAMUSCULAR | Status: DC | PRN
  Administered 2015-05-12: 10 mL via INTRAVENOUS
  Filled 2015-05-12: qty 10

## 2015-05-12 MED ORDER — HEPARIN SOD (PORK) LOCK FLUSH 100 UNIT/ML IV SOLN
500.0000 [IU] | Freq: Once | INTRAVENOUS | Status: AC
  Administered 2015-05-12: 500 [IU] via INTRAVENOUS

## 2015-05-13 NOTE — Progress Notes (Signed)
[place]  Telephone:(336) [phone] Fax:(336) [phone]     ID: Gary Hampton OB: 26-Jun-1941  MR#: [id number]  TDS#:[id number]  Patient Care Team: Albina Billet, MD as PCP - General (Internal Medicine) Albina Billet, MD (Internal Medicine) Robert Bellow, MD (General Surgery) Clent Jacks, RN as Registered Nurse  CHIEF COMPLAINT/DIAGNOSIS:  1. Adenocarcinoma rectum, clinical stage III (PET-positive Progressive Rectal Mass now protruding outside of the anus (colonoscopy 2013 had reported tubulovillous adenoma with high-grade dysplasia. Patient opted for no treatment at that time). 2. PET-positive Right Colon Mass (colonoscopy 2013 had reported tubulovillous adenoma without high-grade dysplasia) with colonoscopy and biopsy showing dysplastic polyp, also has numerous other polyps on recent colonoscopy and is planned for colectomy after chemoradiation for rectal cancer.  HISTORY OF PRESENT ILLNESS:  Patient returns for continued oncology followup and to continue CIV 5FU infusion chemotherapy. He continues to have issues with missing chemo appts and also comes late for treatment. States he is getting XRT regularly. States he is tolerating chemo fairly well, and also has noticed good improvement in rectal area pain/discomfort. Appetite and weight have been steady. No fevers. Denies any new cough chest pain dyspnea or hemoptysis. No new bone pains. States that he is physically active and cares for himself since he lives alone. Mild diarrhea, denies new BRBPR. No nausea or vomiting.   REVIEW OF SYSTEMS:   ROS As in HPI above. In addition, no fevers or chills. No new headaches or focal weakness.  No new sore throat or dysphagia. No dizziness or palpitation. No abdominal pain, constipation, diarrhea, dysuria or hematuria. No new skin rash or bleeding symptoms. No new paresthesias in extremities. No falls or LOC. PS ECOG 1.  PAST MEDICAL HISTORY: Reviewed Past Medical History    Diagnosis Date  . Diabetes mellitus without complication   . Colon polyp   . Hard of hearing   . Cancer     Rectal   PAST SURGICAL HISTORY:Reviewed Past Surgical History  Procedure Laterality Date  . Colonoscopy  2013    Dr. Dionne Milo  . Cataract extraction Right   . No past surgeries    . Portacath placement Right 03/09/2015    Procedure: INSERTION PORT-A-CATH;  Surgeon: Robert Bellow, MD;  Location: [facility] ORS;  Service: General;  Laterality: Right;  . Flexible sigmoidoscopy N/A 03/09/2015    Procedure: FLEXIBLE SIGMOIDOSCOPY/rectal biopsy;  Surgeon: Robert Bellow, MD;  Location: [facility] ORS;  Service: General;  Laterality: N/A;  . Colonoscopy N/A 03/15/2015    Procedure: COLONOSCOPY;  Surgeon: Robert Bellow, MD;  Location: [facility] ENDOSCOPY;  Service: Endoscopy;  Laterality: N/A;    FAMILY HISTORY:Reviewed Remarkable for breast cancer, melanoma. Denies GI malignancies.  SOCIAL HISTORY: Reviewed History  Substance Use Topics  . Smoking status: Former Smoker -- 1.00 packs/day for 3 years    Types: Cigarettes, Pipe, Cigars    Quit date: 10/28/1978  . Smokeless tobacco: Never Used  . Alcohol Use: 0.0 oz/week    0 Standard drinks or equivalent per week     Comment: 3/day    No Known Allergies  Current Outpatient Prescriptions  Medication Sig Dispense Refill  . INSULIN ASPART [place] Inject 18 Units into the skin.    Marland Kitchen lidocaine-prilocaine (EMLA) cream Small amount of cream 1 hour before chemotherapy treatment 30 g 0  . loperamide (IMODIUM A-D) 2 MG tablet Take 2 mg by mouth 4 (four) times daily as needed for diarrhea or loose stools.    Marland Kitchen  promethazine (PHENERGAN) 25 MG tablet Take 1 tablet (25 mg total) by mouth every 6 (six) hours as needed for nausea or vomiting. 30 tablet 1   No current facility-administered medications for this visit.    OBJECTIVE: Filed Vitals:   05/02/15 1529  BP: 163/81  Pulse: 88  Temp: 96.9 F (36.1 C)  Resp: 18     Body mass index is  24.39 kg/(m^2).    ECOG FS:1 - Symptomatic but completely ambulatory  GENERAL: patient is alert and oriented and in no acute distress. No icterus. HEENT: EOMs intact. Oral exam negative for thrush or mucositis.  CVS: S1S2, regular LUNGS: Bilaterally clear to auscultation, no creps or rhonchi. ABDOMEN: Soft, nontender.    NEURO: grossly nonfocal, cranial nerves are intact.   EXTREMITIES: No pedal edema.  LAB RESULTS: Hb 14.1, WBC 5.2, plts 208, ANC 3.9, Cr 0.97, LFT unremarkable.  STUDIES: 03/03/15 - PET Scan. IMPRESSION: 1. Hypermetabolic mass in the ascending colon consistent with neoplasm. 2. Hypermetabolic rectal mass consistent with neoplasm. There are small scattered perirectal lymph node without hypermetabolism but certainly suspicious for lymph node involvement. No enlarged or hypermetabolic nodes in the sigmoid mesocolon or retroperitoneum.Small scattered retroperitoneal lymph nodes are indeterminate but likely benign. 3. No findings for metastatic disease involving the neck or chest.  03/09/15 - Rectal mass biopsy. DIAGNOSIS: A. POSTERIOR RECTAL WALL; BIOPSY: TUBULOVILLOUS ADENOMA WITH ADENOCARCINOMA.  Note - The depth of invasion cannot be determined in this material. The results of this case were discussed with Dr. Bary Castilla on 03/13/15 by Dr. Luana Shu. Multiple additional deeper HE stained sections were examined.   03/15/15 - Colonoscopy biopsy. DIAGNOSIS:  A. COLON POLYP, ASCENDING; COLD BIOPSY: - TUBULAR ADENOMA, MULTIPLE FRAGMENTS. NEGATIVE FOR HIGH-GRADE DYSPLASIA AND MALIGNANCY. B. COLON POLYP, HEPATIC FLEXURE; COLD BIOPSY: - VILLOUS ADENOMA, MULTIPLE FRAGMENTS. NEGATIVE FOR HIGH-GRADE DYSPLASIA AND MALIGNANCY. CLINICAL CORRELATION IS ADVISED WITH REGARD TO COMPLETENESS OF  REMOVAL.   STAGING: Rectal cancer   Staging form: Colon and Rectum, AJCC 7th Edition     Clinical stage from 04/09/2015: Stage IIIA (T2, N1, M0) - Signed by Leia Alf, MD on 04/09/2015   ASSESSMENT / PLAN:    1. Adenocarcinoma rectum, clinical stage III (PET-positive Progressive Rectal Mass now protruding outside of the anus (colonoscopy 2013 had reported tubulovillous adenoma with high-grade dysplasia. Patient opted for no treatment at that time). 2. PET-positive Right Colon Mass (colonoscopy 2013 had reported tubulovillous adenoma without high-grade dysplasia) with colonoscopy and biopsy showing dysplastic polyp, also has numerous other polyps on recent colonoscopy and is planned for colectomy after chemoradiation for rectal cancer. Reviewed labs from today and d/w patient. Have again explained also that he needs to be compliant with taking chemo on schedule, he is agreeable and states he will try his best, and is agreeable to continue on concurrent 5-FU radiosensitizing chemotherapy. Will pursue next dose of 5FU infusion 225 mg/m2/day on days of the week when he gets radiation, starting today. Advised imodium as needed for diarrhea. Lab and chemo at 1 week, after which he completes XRT. Per d/w Dr.Byrnett, he plans to see him in few weeks and plan surgery, he is planned for colectomy given dysplastic polyp + numerous other polyps on recent colonoscopy. Next MD f/u on 8/11 with labs and plan continued treatment.   3. Nutrition - encouraged to maintain increased protein-calorie intake. 4. Pain - doing better, encouraged him to take pain medication if needed.  5. In between visits, the patient has been advised to call  or come to the ER in case of fevers, bleeding, acute sickness or new symptoms. He is     agreeable to this plan.     Leia Alf, MD   05/13/2015 9:00 PM

## 2015-05-15 ENCOUNTER — Ambulatory Visit
Admission: RE | Admit: 2015-05-15 | Discharge: 2015-05-15 | Disposition: A | Discharge status: Home / Self Care | Payer: Medicare Other | Source: Ambulatory Visit | Attending: Radiation Oncology | Admitting: Radiation Oncology

## 2015-05-15 DIAGNOSIS — Z510 Encounter for antineoplastic radiation therapy: Secondary | ICD-10-CM | POA: Diagnosis not present

## 2015-05-16 ENCOUNTER — Ambulatory Visit: Payer: Medicare Other

## 2015-05-16 ENCOUNTER — Ambulatory Visit
Admission: RE | Admit: 2015-05-16 | Discharge: 2015-05-16 | Disposition: A | Discharge status: Home / Self Care | Payer: Medicare Other | Source: Ambulatory Visit | Attending: Radiation Oncology | Admitting: Radiation Oncology

## 2015-05-16 DIAGNOSIS — Z510 Encounter for antineoplastic radiation therapy: Secondary | ICD-10-CM | POA: Diagnosis not present

## 2015-05-17 ENCOUNTER — Ambulatory Visit
Admission: RE | Admit: 2015-05-17 | Discharge: 2015-05-17 | Disposition: A | Discharge status: Home / Self Care | Payer: Medicare Other | Source: Ambulatory Visit | Attending: Radiation Oncology | Admitting: Radiation Oncology

## 2015-05-17 DIAGNOSIS — Z510 Encounter for antineoplastic radiation therapy: Secondary | ICD-10-CM | POA: Diagnosis not present

## 2015-06-12 ENCOUNTER — Inpatient Hospital Stay: Payer: Medicare Other | Attending: Internal Medicine | Admitting: Internal Medicine

## 2015-06-12 ENCOUNTER — Ambulatory Visit: Payer: Medicare Other | Admitting: Internal Medicine

## 2015-06-12 ENCOUNTER — Ambulatory Visit
Admission: RE | Admit: 2015-06-12 | Discharge: 2015-06-12 | Disposition: A | Discharge status: Home / Self Care | Payer: Medicare Other | Source: Ambulatory Visit | Attending: Radiation Oncology | Admitting: Radiation Oncology

## 2015-06-12 ENCOUNTER — Other Ambulatory Visit: Payer: Medicare Other

## 2015-06-12 ENCOUNTER — Encounter: Payer: Self-pay | Admitting: Radiation Oncology

## 2015-06-12 ENCOUNTER — Inpatient Hospital Stay: Payer: Medicare Other

## 2015-06-12 ENCOUNTER — Ambulatory Visit: Payer: Medicare Other | Admitting: Radiation Oncology

## 2015-06-12 VITALS — BP 164/88 | HR 80 | Temp 96.6°F | Resp 18 | Ht 72.0 in | Wt 183.4 lb

## 2015-06-12 VITALS — BP 133/75 | HR 101 | Temp 96.4°F | Resp 21 | Wt 183.0 lb

## 2015-06-12 DIAGNOSIS — C20 Malignant neoplasm of rectum: Secondary | ICD-10-CM | POA: Insufficient documentation

## 2015-06-12 DIAGNOSIS — Z87891 Personal history of nicotine dependence: Secondary | ICD-10-CM | POA: Insufficient documentation

## 2015-06-12 DIAGNOSIS — R197 Diarrhea, unspecified: Secondary | ICD-10-CM | POA: Diagnosis not present

## 2015-06-12 DIAGNOSIS — Z794 Long term (current) use of insulin: Secondary | ICD-10-CM | POA: Insufficient documentation

## 2015-06-12 DIAGNOSIS — Z9221 Personal history of antineoplastic chemotherapy: Secondary | ICD-10-CM | POA: Diagnosis not present

## 2015-06-12 DIAGNOSIS — D374 Neoplasm of uncertain behavior of colon: Secondary | ICD-10-CM | POA: Insufficient documentation

## 2015-06-12 DIAGNOSIS — H9190 Unspecified hearing loss, unspecified ear: Secondary | ICD-10-CM | POA: Insufficient documentation

## 2015-06-12 DIAGNOSIS — Z923 Personal history of irradiation: Secondary | ICD-10-CM | POA: Insufficient documentation

## 2015-06-12 DIAGNOSIS — Z808 Family history of malignant neoplasm of other organs or systems: Secondary | ICD-10-CM | POA: Diagnosis not present

## 2015-06-12 DIAGNOSIS — E119 Type 2 diabetes mellitus without complications: Secondary | ICD-10-CM | POA: Diagnosis not present

## 2015-06-12 DIAGNOSIS — Z79899 Other long term (current) drug therapy: Secondary | ICD-10-CM | POA: Insufficient documentation

## 2015-06-12 DIAGNOSIS — C260 Malignant neoplasm of intestinal tract, part unspecified: Secondary | ICD-10-CM

## 2015-06-12 LAB — CBC WITH DIFFERENTIAL/PLATELET
BASOS ABS: 0 10*3/uL (ref 0–0.1)
Basophils Relative: 1 %
Eosinophils Absolute: 0.3 10*3/uL (ref 0–0.7)
Eosinophils Relative: 6 %
HCT: 44 % (ref 40.0–52.0)
Hemoglobin: 15.2 g/dL (ref 13.0–18.0)
LYMPHS PCT: 12 %
Lymphs Abs: 0.6 10*3/uL — ABNORMAL LOW (ref 1.0–3.6)
MCH: 33.5 pg (ref 26.0–34.0)
MCHC: 34.5 g/dL (ref 32.0–36.0)
MCV: 97.2 fL (ref 80.0–100.0)
MONO ABS: 0.5 10*3/uL (ref 0.2–1.0)
MONOS PCT: 11 %
NEUTROS PCT: 70 %
Neutro Abs: 3.3 10*3/uL (ref 1.4–6.5)
Platelets: 172 10*3/uL (ref 150–440)
RBC: 4.52 MIL/uL (ref 4.40–5.90)
RDW: 14.5 % (ref 11.5–14.5)
WBC: 4.6 10*3/uL (ref 3.8–10.6)

## 2015-06-12 LAB — HEPATIC FUNCTION PANEL
ALBUMIN: 3.7 g/dL (ref 3.5–5.0)
ALK PHOS: 50 U/L (ref 38–126)
ALT: 13 U/L — ABNORMAL LOW (ref 17–63)
AST: 17 U/L (ref 15–41)
BILIRUBIN DIRECT: 0.2 mg/dL (ref 0.1–0.5)
Indirect Bilirubin: 0.6 mg/dL (ref 0.3–0.9)
Total Bilirubin: 0.8 mg/dL (ref 0.3–1.2)
Total Protein: 6.5 g/dL (ref 6.5–8.1)

## 2015-06-12 LAB — BASIC METABOLIC PANEL
ANION GAP: 7 (ref 5–15)
BUN: 19 mg/dL (ref 6–20)
CALCIUM: 8.6 mg/dL — AB (ref 8.9–10.3)
CO2: 26 mmol/L (ref 22–32)
Chloride: 101 mmol/L (ref 101–111)
Creatinine, Ser: 0.99 mg/dL (ref 0.61–1.24)
GFR calc non Af Amer: >60 mL/min (ref >60)
GLUCOSE: 286 mg/dL — AB (ref 65–99)
Potassium: 4.4 mmol/L (ref 3.5–5.1)
Sodium: 134 mmol/L — ABNORMAL LOW (ref 135–145)

## 2015-06-12 MED ORDER — SODIUM CHLORIDE 0.9 % IJ SOLN
10.0000 mL | Freq: Once | INTRAMUSCULAR | Status: AC
  Administered 2015-06-12: 10 mL via INTRAVENOUS
  Filled 2015-06-12: qty 10

## 2015-06-12 MED ORDER — HEPARIN SOD (PORK) LOCK FLUSH 100 UNIT/ML IV SOLN
500.0000 [IU] | Freq: Once | INTRAVENOUS | Status: AC
  Administered 2015-06-12: 500 [IU] via INTRAVENOUS
  Filled 2015-06-12: qty 5

## 2015-06-12 NOTE — Progress Notes (Signed)
Patient states that about two weeks after his last chemotherapy and radiation treatment, his thighs became very weak and fatigued. He states that he had to use a walker for several days. He states that this eventually went away.

## 2015-06-12 NOTE — Progress Notes (Signed)
Radiation Oncology Follow up Note  Name: Gary Hampton   Date:   06/12/2015 MRN:  [id number] DOB: [birth year]    This 74 y.o. male presents to the clinic today for follow-up for new adjuvant treatment ofrectal cancer.  REFERRING PROVIDER: Albina Billet, MD  HPI: Patient is a 74 year old male now one month out having completed concurrent chemotherapy and radiation therapy for a large rectal mass protruding from the anus that was. 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 Patient has multiple comorbidities including adult-onset diabetes hypertension. He is seen today in follow-up and is doing fairly well. He still states that he has occasional rectal incontinence. Diarrhea has improved. He is seeing medical oncology today has not seen a surgeon yet for consideration of resection.  COMPLICATIONS OF TREATMENT: none  FOLLOW UP COMPLIANCE: keeps appointments   PHYSICAL EXAM:  BP 133/75 mmHg  Pulse 101  Temp(Src) 96.4 F (35.8 C)  Resp 21  Wt 182 lb 15.7 oz (83 kg) Well-developed well-nourished patient in NAD. HEENT reveals PERLA, EOMI, discs not visualized.  Oral cavity is clear. No oral mucosal lesions are identified. Neck is clear without evidence of cervical or supraclavicular adenopathy. Lungs are clear to A&P. Cardiac examination is essentially unremarkable with regular rate and rhythm without murmur rub or thrill. Abdomen is benign with no organomegaly or masses noted. Motor sensory and DTR levels are equal and symmetric in the upper and lower extremities. Cranial nerves II through XII are grossly intact. Proprioception is intact. No peripheral adenopathy or edema is identified. No motor or sensory levels are noted. Crude visual fields are within normal range.   RADIOLOGY RESULTS: No recent films for review  PLAN: At this time he'll see medical oncology. Have discussed the case with medical oncology and he will have appointment made for possibility of surgical resection.  I've asked to see him back in 4-5 months and will review his final pathology at that time. He otherwise is recovering well from his combined modality treatment.  I would like to take this opportunity for allowing me to participate in the care of your patient.Armstead Peaks., MD

## 2015-06-13 ENCOUNTER — Ambulatory Visit
Admission: RE | Admit: 2015-06-13 | Discharge: 2015-06-13 | Disposition: A | Discharge status: Home / Self Care | Payer: Medicare Other | Source: Ambulatory Visit | Attending: Internal Medicine | Admitting: Internal Medicine

## 2015-06-13 DIAGNOSIS — C20 Malignant neoplasm of rectum: Secondary | ICD-10-CM

## 2015-06-13 LAB — CEA (SERIAL MONITOR): CEA: 5 ng/mL — ABNORMAL HIGH (ref 0.0–4.7)

## 2015-06-13 MED ORDER — IOHEXOL 300 MG/ML  SOLN
100.0000 mL | Freq: Once | INTRAMUSCULAR | Status: AC | PRN
  Administered 2015-06-13: 100 mL via INTRAVENOUS

## 2015-06-21 ENCOUNTER — Ambulatory Visit (INDEPENDENT_AMBULATORY_CARE_PROVIDER_SITE_OTHER): Payer: Medicare Other | Admitting: General Surgery

## 2015-06-21 ENCOUNTER — Encounter: Payer: Self-pay | Admitting: General Surgery

## 2015-06-21 VITALS — BP 130/60 | HR 80 | Resp 14 | Ht 72.0 in | Wt 184.0 lb

## 2015-06-21 DIAGNOSIS — Z86010 Personal history of colonic polyps: Secondary | ICD-10-CM

## 2015-06-21 DIAGNOSIS — C20 Malignant neoplasm of rectum: Secondary | ICD-10-CM

## 2015-06-21 NOTE — Progress Notes (Addendum)
Patient ID: Gary Hampton, male   DOB: 1941-08-04, 74 y.o.   MRN: [id number]  Chief Complaint  Patient presents with  . Follow-up    rectal mass    HPI Gary Hampton is a 74 y.o. male here to discuss surgical options for rectal cancer. He has completed his radiation and chemo treatments. Chemotherapy was completed on 05/17/15. He reports that Dr Ma Hillock had blood work completed on him last week. He reports that his appetite has improved since completing chemo. He states that his bowel incontinence has improved with the use of Imodium. He did very well through his treatments. HPI  Past Medical History  Diagnosis Date  . Diabetes mellitus without complication   . Colon polyp   . Hard of hearing   . Cancer     Rectal    Past Surgical History  Procedure Laterality Date  . Colonoscopy  2013    Dr. Dionne Milo  . Cataract extraction Right   . No past surgeries    . Portacath placement Right 03/09/2015    Procedure: INSERTION PORT-A-CATH;  Surgeon: Robert Bellow, MD;  Location: [facility] ORS;  Service: General;  Laterality: Right;  . Flexible sigmoidoscopy N/A 03/09/2015    Procedure: FLEXIBLE SIGMOIDOSCOPY/rectal biopsy;  Surgeon: Robert Bellow, MD;  Location: [facility] ORS;  Service: General;  Laterality: N/A;  . Colonoscopy N/A 03/15/2015    Procedure: COLONOSCOPY;  Surgeon: Robert Bellow, MD;  Location: [facility] ENDOSCOPY;  Service: Endoscopy;  Laterality: N/A;    No family history on file.  Social History Social History  Substance Use Topics  . Smoking status: Former Smoker -- 1.00 packs/day for 3 years    Types: Cigarettes, Pipe, Cigars    Quit date: 10/28/1978  . Smokeless tobacco: Never Used  . Alcohol Use: 0.0 oz/week    0 Standard drinks or equivalent per week     Comment: 3/day    No Known Allergies  Current Outpatient Prescriptions  Medication Sig Dispense Refill  . INSULIN ASPART [place] Inject 18 Units into the skin.    Marland Kitchen LANTUS SOLOSTAR 100 UNIT/ML Solostar Pen     .  lidocaine-prilocaine (EMLA) cream Small amount of cream 1 hour before chemotherapy treatment 30 g 0  . loperamide (IMODIUM A-D) 2 MG tablet Take 2 mg by mouth 4 (four) times daily as needed for diarrhea or loose stools.    . promethazine (PHENERGAN) 25 MG tablet Take 1 tablet (25 mg total) by mouth every 6 (six) hours as needed for nausea or vomiting. 30 tablet 1   No current facility-administered medications for this visit.    Review of Systems Review of Systems  Constitutional: Negative.   HENT: Negative.   Eyes: Negative.   Respiratory: Negative.   Cardiovascular: Negative.   Gastrointestinal: Negative.   Endocrine: Negative.   Genitourinary: Negative.   Allergic/Immunologic: Negative.   Neurological: Negative.   Hematological: Negative.   Psychiatric/Behavioral: Negative.     Blood pressure 130/60, pulse 80, resp. rate 14, height 6' (1.829 m), weight 184 lb (83.462 kg). the patient's pretreatment weight was 182 pounds.  Physical Exam Physical Exam  Constitutional: He is oriented to person, place, and time. He appears well-developed and well-nourished.  Eyes: Conjunctivae are normal. No scleral icterus.  Neck: Neck supple.  Cardiovascular: Normal rate, regular rhythm and normal heart sounds.   Pulmonary/Chest: Effort normal and breath sounds normal.  Abdominal: Soft. Bowel sounds are normal. There is no tenderness.  Genitourinary: Rectal exam shows no mass, no tenderness  and anal tone normal.        Skin improved in the anal area.   Lymphadenopathy:    He has no cervical adenopathy.  Neurological: He is alert and oriented to person, place, and time.  Skin: Skin is warm and dry.  Psychiatric: He has a normal mood and affect.    Data Reviewed May 2016 colonoscopy reviewed. Multiple carpet-like polyps involving all segments of the colon. Primary tumor in the rectum at 7-8 cm.  CEA fell from 33 ng/daL 25 ng/dl posttreatment   Assessment    Excellent response to  medial adjuvant chemoradiation.  Candidate for pancolectomy.    Plan    With the extensive polyp disease, and the flat polyps which are not easily amenable to colonoscopic polypectomy and have a higher rate of malignant degeneration, I've recommended that he have a total proctocolectomy. This would most likely result in an ileostomy. The volumes of fluid that are associated with ileostomy care and the potential for leakage were reviewed. We'll investigate with [facility] whether a ileoanal anastomosis would be considered, although not sure a perineal ileostomy is ideal. The likelihood of impotence post surgery was reviewed. The patient reports he has not been sexually active in many years. We'll arrange for an outpatient visit with the stoma nurse prior to surgery. We'll plan to wait another 4-5 weeks, during which time his homework assignments is to gain weight and he is high protein diet.  More than 50% days visit was spent reviewing options for management and anticipated recovery.  He reports he has made arrangement for someone to care for his horse while he is recuperating.    PCP: Benita Stabile   Robert Bellow 06/23/2015, 11:20 AM

## 2015-06-22 ENCOUNTER — Telehealth: Payer: Self-pay | Admitting: *Deleted

## 2015-06-22 NOTE — Telephone Encounter (Signed)
Left a message to see if the Wound and Ostomy nurse at [facility] to call back to see about what options were available for preop ostomy patient education.

## 2015-06-23 DIAGNOSIS — Z86010 Personal history of colonic polyps: Secondary | ICD-10-CM | POA: Insufficient documentation

## 2015-06-26 ENCOUNTER — Telehealth: Payer: Self-pay | Admitting: *Deleted

## 2015-06-26 NOTE — Telephone Encounter (Signed)
Called Dr. Hosie Poisson office to confirm fax number.  Records faxed to Attn: [facility]. She will contact patient with an appointment.   Message left to confirm that Dr. Hosie Poisson office received records.

## 2015-06-26 NOTE — Telephone Encounter (Signed)
-----   Message from Robert Bellow, MD sent at 06/26/2015  2:46 PM EDT ----- I have corresponded with Dr. Audie Clear at [facility] and he is amenable to see this patient regarding: Removal and ileoanal reconstruction. Patient is amenable to go talk to him. Please facilitate an appointment. I told him to carious phone with him so when they call he can get the information.

## 2015-06-26 NOTE — Telephone Encounter (Signed)
Message for patient to call the office.   Also, spoke with neighbor and asked him to have patient call the office.   Patient is very difficult to reach by phone and he is hard of hearing.   Dr. Bary Castilla needs to speak with the patient regarding additional surgery options. He either needs to talk to him on the phone or see him back this week to discuss in person.

## 2015-06-27 ENCOUNTER — Telehealth: Payer: Self-pay | Admitting: *Deleted

## 2015-06-27 NOTE — Telephone Encounter (Signed)
I spoke with [name] and she will take care of teaching and possible markings at his pre admit appointment on 07-06-15. Appreciates referral and opportunity to assist with Mr Gary Hampton.

## 2015-06-27 NOTE — Telephone Encounter (Signed)
Message for patient to call the office.   Beverly at Dr. Hosie Poisson office has been unable to reach the patient by phone. They can see patient on 07-05-15 at 11 am. Patient will need to take a disc of all 2016 images that he has had done at [facility] for Dr. Hosie Poisson review. Chanel in radiology at [facility] will put these on a disc for patient to pick up and take to his appointment. Dr. Hosie Poisson address is [place] [address] Gaspar Cola -GI surgery clinic 1st floor. If patient has questions, he can contact [place] at [phone].  We also, need to review tentative surgery plans/pre-admission appointment and make a pre-op appointment with Dr. Bary Castilla.

## 2015-06-27 NOTE — Telephone Encounter (Signed)
Spoke with patient about appointment with Dr. Audie Clear. Patient was given all information and I let him know to pick up his disk at [facility] to bring with him to this appointment. He will call to reschedule this to an afternoon time more convent for him. I spoke with him regarding surgery tentatively scheduled for 07/21/15. He said that he wants to cancel his pre admit testing appointment scheduled for 06/06/15 until after he is seen by Dr Audie Clear as he says " I can only handle one thing at a time". We will leave the surgery date as scheduled for now until he is seen by Dr Audie Clear. He will schedule his pre admit appointment and pre op appointment with Dr Bary Castilla after he is seen by Dr Audie Clear. Patient is aware to call us once he has been seen by Dr Audie Clear.

## 2015-06-28 NOTE — Progress Notes (Signed)
[place]  [phone] [phone]     ID: Gary Hampton OB: 08-27-1941  MR#: [id number]  LKG#:[id number]  Patient Care Team: Albina Billet, MD as PCP - General (Internal Medicine) Albina Billet, MD (Internal Medicine) Robert Bellow, MD (General Surgery) Clent Jacks, RN as Registered Nurse  CHIEF COMPLAINT/DIAGNOSIS:  1. Adenocarcinoma rectum, clinical stage III (PET-positive Progressive Rectal Mass now protruding outside of the anus (colonoscopy 2013 had reported tubulovillous adenoma with high-grade dysplasia. Patient opted for no treatment at that time). 2. PET-positive Right Colon Mass (colonoscopy 2013 had reported tubulovillous adenoma without high-grade dysplasia) with colonoscopy and biopsy showing dysplastic polyp, also has numerous other polyps on recent colonoscopy and is planned for colectomy after chemoradiation for rectal cancer.  HISTORY OF PRESENT ILLNESS:  Patient returns for continued oncology followup, he recently completed chemoradiation. States that he is doing better, still has mild discomfort but diarrhea is improving. Denies any major rectal bleeding. No nausea or vomiting.  Appetite and weight have been steady. No fevers. Denies any new cough chest pain dyspnea or hemoptysis. No new bone pains. States that he is physically active and cares for himself .   REVIEW OF SYSTEMS:   Review of Systems  Genitourinary: Negative for urgency.  As in HPI above. In addition, no fevers. No new headaches or focal weakness.  No new sore throat or dysphagia. No dizziness or palpitation. No abdominal pain, constipation, diarrhea, dysuria or hematuria. No new skin rash or bleeding symptoms. No new paresthesias in extremities. PS ECOG 1.  PAST MEDICAL HISTORY: Reviewed Past Medical History  Diagnosis Date  . Diabetes mellitus without complication   . Colon polyp   . Hard of hearing   . Cancer     Rectal   PAST SURGICAL  HISTORY:Reviewed Past Surgical History  Procedure Laterality Date  . Colonoscopy  2013    Dr. Dionne Milo  . Cataract extraction Right   . No past surgeries    . Portacath placement Right 03/09/2015    Procedure: INSERTION PORT-A-CATH;  Surgeon: Robert Bellow, MD;  Location: [facility] ORS;  Service: General;  Laterality: Right;  . Flexible sigmoidoscopy N/A 03/09/2015    Procedure: FLEXIBLE SIGMOIDOSCOPY/rectal biopsy;  Surgeon: Robert Bellow, MD;  Location: [facility] ORS;  Service: General;  Laterality: N/A;  . Colonoscopy N/A 03/15/2015    Procedure: COLONOSCOPY;  Surgeon: Robert Bellow, MD;  Location: [facility] ENDOSCOPY;  Service: Endoscopy;  Laterality: N/A;    FAMILY HISTORY:Reviewed Remarkable for breast cancer, melanoma. Denies GI malignancies.  SOCIAL HISTORY: Reviewed Social History  Substance Use Topics  . Smoking status: Former Smoker -- 1.00 packs/day for 3 years    Types: Cigarettes, Pipe, Cigars    Quit date: 10/28/1978  . Smokeless tobacco: Never Used  . Alcohol Use: 0.0 oz/week    0 Standard drinks or equivalent per week     Comment: 3/day    No Known Allergies  Current Outpatient Prescriptions  Medication Sig Dispense Refill  . INSULIN ASPART [place] Inject 18 Units into the skin.    Marland Kitchen LANTUS SOLOSTAR 100 UNIT/ML Solostar Pen     . lidocaine-prilocaine (EMLA) cream Small amount of cream 1 hour before chemotherapy treatment 30 g 0  . loperamide (IMODIUM A-D) 2 MG tablet Take 2 mg by mouth 4 (four) times daily as needed for diarrhea or loose stools.    . promethazine (PHENERGAN) 25 MG tablet Take 1 tablet (25 mg total) by mouth every 6 (six)  hours as needed for nausea or vomiting. 30 tablet 1   No current facility-administered medications for this visit.    OBJECTIVE: Filed Vitals:   06/12/15 1425  BP: 164/88  Pulse: 80  Temp: 96.6 F (35.9 C)  Resp: 18     Body mass index is 24.87 kg/(m^2).    ECOG FS:1 - Symptomatic but completely ambulatory  GENERAL: patient  is alert and oriented and in no acute distress. No icterus. HEENT: EOMs intact. Oral exam negative for thrush or mucositis.  CVS: S1S2, regular LUNGS: Bilaterally clear to auscultation, no rhonchi. ABDOMEN: Soft, nontender.    EXTREMITIES: No pedal edema.  LAB RESULTS: Creatinine 0.99, potassium 4.4, LFT unremarkable, WBC 4.6, hemoglobin 15.2, platelets 172. 06/12/15  - serum CEA 5.0 (was 33.8 on 04/03/15)  STUDIES: 03/03/15 - PET Scan. IMPRESSION: 1. Hypermetabolic mass in the ascending colon consistent with neoplasm. 2. Hypermetabolic rectal mass consistent with neoplasm. There are small scattered perirectal lymph node without hypermetabolism but certainly suspicious for lymph node involvement. No enlarged or hypermetabolic nodes in the sigmoid mesocolon or retroperitoneum.Small scattered retroperitoneal lymph nodes are indeterminate but likely benign. 3. No findings for metastatic disease involving the neck or chest.  03/09/15 - Rectal mass biopsy. DIAGNOSIS: A. POSTERIOR RECTAL WALL; BIOPSY: TUBULOVILLOUS ADENOMA WITH ADENOCARCINOMA.  Note - The depth of invasion cannot be determined in this material. The results of this case were discussed with Dr. Bary Castilla on 03/13/15 by Dr. Luana Shu. Multiple additional deeper HE stained sections were examined.   03/15/15 - Colonoscopy biopsy. DIAGNOSIS:  A. COLON POLYP, ASCENDING; COLD BIOPSY: - TUBULAR ADENOMA, MULTIPLE FRAGMENTS. NEGATIVE FOR HIGH-GRADE DYSPLASIA AND MALIGNANCY. B. COLON POLYP, HEPATIC FLEXURE; COLD BIOPSY: - VILLOUS ADENOMA, MULTIPLE FRAGMENTS. NEGATIVE FOR HIGH-GRADE DYSPLASIA AND MALIGNANCY. CLINICAL CORRELATION IS ADVISED WITH REGARD TO COMPLETENESS OF  REMOVAL.   STAGING: Rectal cancer   Staging form: Colon and Rectum, AJCC 7th Edition     Clinical stage from 04/09/2015: Stage IIIA (T2, N1, M0) - Signed by Leia Alf, MD on 04/09/2015   ASSESSMENT / PLAN:   1. Adenocarcinoma rectum, clinical stage III (PET-positive Progressive  Rectal Mass now protruding outside of the anus (colonoscopy 2013 had reported tubulovillous adenoma with high-grade dysplasia. Patient opted for no treatment at that time). 2. PET-positive Right Colon Mass (colonoscopy 2013 had reported tubulovillous adenoma without high-grade dysplasia) with colonoscopy and biopsy showing dysplastic polyp, also has numerous other polyps on recent colonoscopy and is planned for colectomy after chemoradiation for rectal cancer. Reviewed labs from today and d/w patient. He has completed chemoradiation. Currently clinically doing study, no major pain issues or bleeding issues. Plan is to obtain restaging CT scan of the abdomen and pelvis in the next few days. He has appointment to see surgeon Dr. Bary Castilla in the next few weeks to plan surgical resection for rectal cancer. Will see him back in 6 weeks and make further plan of management based upon surgical pathology report, patient explained that based upon initial staging he would need adjuvant chemotherapy with FOLFOX regimen 12 doses.  Per d/w Dr.Byrnett, he plans to do colectomy given dysplastic polyp + numerous other polyps on recent colonoscopy.    3. Nutrition - encouraged to maintain increased protein-calorie intake. 4. In between visits, the patient has been advised to call or come to the ER in case of fevers, bleeding, acute sickness or new symptoms. He is agreeable to this plan.     Leia Alf, MD   06/28/2015 5:25 PM

## 2015-07-06 ENCOUNTER — Other Ambulatory Visit: Payer: Medicare Other

## 2015-07-12 ENCOUNTER — Telehealth: Payer: Self-pay | Admitting: *Deleted

## 2015-07-12 NOTE — Telephone Encounter (Signed)
Message for patient to call the office.  

## 2015-07-12 NOTE — Telephone Encounter (Signed)
-----   Message from Robert Bellow, MD sent at 07/12/2015  8:39 AM EDT ----- See if you can get the patient to come in either late Friday AM or mid afternoon tomorrow to discuss options. Thanks.l

## 2015-07-13 NOTE — Telephone Encounter (Signed)
Patient to see Dr. Bary Castilla on 07-14-15 at 12 noon.

## 2015-07-14 ENCOUNTER — Other Ambulatory Visit: Payer: Self-pay | Admitting: General Surgery

## 2015-07-14 ENCOUNTER — Ambulatory Visit (INDEPENDENT_AMBULATORY_CARE_PROVIDER_SITE_OTHER): Payer: Medicare Other | Admitting: General Surgery

## 2015-07-14 ENCOUNTER — Encounter: Payer: Self-pay | Admitting: General Surgery

## 2015-07-14 VITALS — BP 132/58 | HR 88 | Resp 14 | Ht 72.0 in | Wt 189.0 lb

## 2015-07-14 DIAGNOSIS — C20 Malignant neoplasm of rectum: Secondary | ICD-10-CM | POA: Diagnosis not present

## 2015-07-14 DIAGNOSIS — Z86010 Personal history of colonic polyps: Secondary | ICD-10-CM

## 2015-07-14 MED ORDER — POLYETHYLENE GLYCOL 3350 17 GM/SCOOP PO POWD: 1.0000 | Freq: Once | ORAL | Status: DC

## 2015-07-14 MED ORDER — ALVIMOPAN 12 MG PO CAPS: 12.0000 mg | ORAL_CAPSULE | Freq: Once | ORAL | Status: DC

## 2015-07-14 NOTE — H&P (Signed)
HPI Gary Hampton is a 74 y.o. male here to discuss treatment and surgical options. He was seen by Dr Audie Clear at [facility] last week on 07/05/15. He reports that he has been using the bathroom with out any difficulty. He denies any bleeding or rectal pain. The patient has been supplementing his diet with some extra calories as previously suggested. Weight is up 5 pounds from 1 month ago.  Since his last visit here he was evaluated by Donia Ast, M.D. in the colon and rectal surgery Department at [facility]. He was not thought to be a candidate for sphincter sparing ileal anal anastomosis. They had planned to re-scope him and then proceed to surgery with an APR/plus/minus additional colon resection. The patient was asked to return to discuss whether he would like to have this procedure completed locally or at [facility]. HPI  Past Medical History  Diagnosis Date  . Diabetes mellitus without complication   . Colon polyp   . Hard of hearing   . Cancer     Rectal    Past Surgical History  Procedure Laterality Date  . Colonoscopy  2013    Dr. Dionne Milo  . Cataract extraction Right   . No past surgeries    . Portacath placement Right 03/09/2015    Procedure: INSERTION PORT-A-CATH; Surgeon: Robert Bellow, MD; Location: [facility] ORS; Service: General; Laterality: Right;  . Flexible sigmoidoscopy N/A 03/09/2015    Procedure: FLEXIBLE SIGMOIDOSCOPY/rectal biopsy; Surgeon: Robert Bellow, MD; Location: [facility] ORS; Service: General; Laterality: N/A;  . Colonoscopy N/A 03/15/2015    Procedure: COLONOSCOPY; Surgeon: Robert Bellow, MD; Location: [facility] ENDOSCOPY; Service: Endoscopy; Laterality: N/A;    No family history on file.  Social History Social History  Substance Use Topics  . Smoking status: Former Smoker -- 1.00 packs/day for 3 years    Types: Cigarettes, Pipe, Cigars    Quit date: 10/28/1978  . Smokeless tobacco:  Never Used  . Alcohol Use: 0.0 oz/week    0 Standard drinks or equivalent per week     Comment: 3/day    No Known Allergies  Current Outpatient Prescriptions  Medication Sig Dispense Refill  . INSULIN ASPART [place] Inject 18 Units into the skin.    Marland Kitchen LANTUS SOLOSTAR 100 UNIT/ML Solostar Pen     . loperamide (IMODIUM A-D) 2 MG tablet Take 2 mg by mouth 4 (four) times daily as needed for diarrhea or loose stools.    . polyethylene glycol powder (MIRALAX) powder Take 255 g by mouth once. 255 g 0   No current facility-administered medications for this visit.    Review of Systems Review of Systems  Constitutional: Negative.  Respiratory: Negative.  Cardiovascular: Negative.  Gastrointestinal: Negative for nausea, vomiting, abdominal pain, diarrhea, constipation, blood in stool, abdominal distention, anal bleeding and rectal pain.    Blood pressure 132/58, pulse 88, resp. rate 14, height 6' (1.829 m), weight 189 lb (85.73 kg).  Physical Exam Physical Exam  Constitutional: He appears well-developed and well-nourished.  HENT:  Head: Atraumatic.  Eyes: Conjunctivae are normal.  Neck: Neck supple. No thyromegaly present.  Cardiovascular: Normal rate and regular rhythm.  Pulmonary/Chest: Effort normal and breath sounds normal.  Abdominal: Soft.      Data Reviewed [facility] consultation note.  Assessment    Rectal cancer status post neo-adjuvant chemotherapy.  Multiple colonic polyps, right colon dominant.    Plan    We spent the better part of 45 minutes reviewing options for management. Dr. Audie Clear brought up a  good point about the possibility for dehydration in light of the patient's age and insulin-dependent diabetes should he undergo a total colectomy. We had a frank discussion about the importance of follow-up exams as polyps have been identified in the transverse, descending and sigmoid colon that we'll likely not be removed until his  next colonoscopy. The potential for additional cancers developing over time were discussed, and at this time this. Medical risk likely is less then the real risk of dehydration and difficulty with ileal stomal management.  As ileoanal surgery is off the list based on the [facility] consultation, the patient was offered the opportunity to have the right colon where multiple polyps including a large pedunculated PET positive polyp has been identified as well as resection of the rectum and anus be completed with formation of a permanent stoma. This is acceptable to the patient. The importance of follow-up colonoscopies is recognized.      Patient is scheduled for surgery at [facility] on 07/21/15. He will pre admit at the hospital on 07/17/15 at 2pm. He is to decrease his insulin to 10 units on his prep day. Patient is aware of dates, time, and instructions.   PCP: Benita Stabile   Robert Bellow 07/14/2015, 1:15 PM

## 2015-07-14 NOTE — Patient Instructions (Signed)
Patient is scheduled for surgery at [facility] on 07/21/15. He will pre admit at the hospital on 07/17/15 at 2pm. He is to decrease his insulin to 10 units on his prep day. Patient is aware of dates, time, and instructions.

## 2015-07-14 NOTE — Progress Notes (Signed)
Patient ID: Gary Hampton, male   DOB: [birth year], 74 y.o.   MRN: [id number]  Chief Complaint  Patient presents with  . Follow-up    treatment discussion    HPI Gary Hampton is a 74 y.o. male here to discuss treatment and surgical options. He was seen by Dr Audie Clear at [facility] last week on 07/05/15. He reports that he has been using the bathroom with out any difficulty. He denies any bleeding or rectal pain. The patient has been supplementing his diet with some extra calories as previously suggested. Weight is up 5 pounds from 1 month ago.  Since his last visit here he was evaluated by Donia Ast, M.D. in the colon and rectal surgery Department at [facility]. He was not thought to be a candidate for sphincter sparing ileal anal anastomosis. They had planned to re-scope him and then proceed to surgery with an APR/plus/minus additional colon resection. The patient was asked to return to discuss whether he would like to have this procedure completed locally or at [facility]. HPI  Past Medical History  Diagnosis Date  . Diabetes mellitus without complication   . Colon polyp   . Hard of hearing   . Cancer     Rectal    Past Surgical History  Procedure Laterality Date  . Colonoscopy  2013    Dr. Dionne Milo  . Cataract extraction Right   . No past surgeries    . Portacath placement Right 03/09/2015    Procedure: INSERTION PORT-A-CATH;  Surgeon: Robert Bellow, MD;  Location: [facility] ORS;  Service: General;  Laterality: Right;  . Flexible sigmoidoscopy N/A 03/09/2015    Procedure: FLEXIBLE SIGMOIDOSCOPY/rectal biopsy;  Surgeon: Robert Bellow, MD;  Location: [facility] ORS;  Service: General;  Laterality: N/A;  . Colonoscopy N/A 03/15/2015    Procedure: COLONOSCOPY;  Surgeon: Robert Bellow, MD;  Location: [facility] ENDOSCOPY;  Service: Endoscopy;  Laterality: N/A;    No family history on file.  Social History Social History  Substance Use Topics  . Smoking status: Former Smoker -- 1.00 packs/day for 3 years    Types: Cigarettes, Pipe, Cigars    Quit date: 10/28/1978  . Smokeless tobacco: Never Used  . Alcohol Use: 0.0 oz/week    0 Standard drinks or equivalent per week     Comment: 3/day    No Known Allergies  Current Outpatient Prescriptions  Medication Sig Dispense Refill  . INSULIN ASPART [place] Inject 18 Units into the skin.    Marland Kitchen LANTUS SOLOSTAR 100 UNIT/ML Solostar Pen     . loperamide (IMODIUM A-D) 2 MG tablet Take 2 mg by mouth 4 (four) times daily as needed for diarrhea or loose stools.    . polyethylene glycol powder (MIRALAX) powder Take 255 g by mouth once. 255 g 0   No current facility-administered medications for this visit.    Review of Systems Review of Systems  Constitutional: Negative.   Respiratory: Negative.   Cardiovascular: Negative.   Gastrointestinal: Negative for nausea, vomiting, abdominal pain, diarrhea, constipation, blood in stool, abdominal distention, anal bleeding and rectal pain.    Blood pressure 132/58, pulse 88, resp. rate 14, height 6' (1.829 m), weight 189 lb (85.73 kg).  Physical Exam Physical Exam  Constitutional: He appears well-developed and well-nourished.  HENT:  Head: Atraumatic.  Eyes: Conjunctivae are normal.  Neck: Neck supple. No thyromegaly present.  Cardiovascular: Normal rate and regular rhythm.   Pulmonary/Chest: Effort normal and breath sounds normal.  Abdominal: Soft.  Data Reviewed [facility] consultation note.  Assessment    Rectal cancer status post neo-adjuvant chemotherapy.  Multiple colonic polyps, right colon dominant.    Plan    We spent the better part of 45 minutes reviewing options for management. Dr. Audie Clear brought up a good point about the possibility for dehydration in light of the patient's age and insulin-dependent diabetes should he undergo a total colectomy. We had a frank discussion about the importance of follow-up exams as polyps have been identified in the transverse, descending and sigmoid colon  that we'll likely not be removed until his next colonoscopy. The potential for additional cancers developing over time were discussed, and at this time this. Medical risk likely is less then the real risk of dehydration and difficulty with ileal stomal management.  As ileoanal surgery is off the list based on the [facility] consultation, the patient was offered the opportunity to have the right colon where multiple polyps including a large pedunculated PET positive polyp has been identified as well as resection of the rectum and anus be completed with formation of a permanent stoma.  This is acceptable to the patient. The importance of follow-up colonoscopies is recognized.      Patient is scheduled for surgery at [facility] on 07/21/15. He will pre admit at the hospital on 07/17/15 at 2pm. He is to decrease his insulin to 10 units on his prep day. Patient is aware of dates, time, and instructions.   PCP: Benita Stabile   Robert Bellow 07/14/2015, 1:15 PM

## 2015-07-17 ENCOUNTER — Encounter
Admission: RE | Admit: 2015-07-17 | Discharge: 2015-07-17 | Disposition: A | Discharge status: Home / Self Care | Payer: Medicare Other | Source: Ambulatory Visit | Attending: General Surgery | Admitting: General Surgery

## 2015-07-17 DIAGNOSIS — Z01812 Encounter for preprocedural laboratory examination: Secondary | ICD-10-CM | POA: Insufficient documentation

## 2015-07-17 LAB — CBC
HCT: 45.1 % (ref 40.0–52.0)
Hemoglobin: 15.5 g/dL (ref 13.0–18.0)
MCH: 32.8 pg (ref 26.0–34.0)
MCHC: 34.4 g/dL (ref 32.0–36.0)
MCV: 95.2 fL (ref 80.0–100.0)
PLATELETS: 183 10*3/uL (ref 150–440)
RBC: 4.73 MIL/uL (ref 4.40–5.90)
RDW: 13 % (ref 11.5–14.5)
WBC: 4.4 10*3/uL (ref 3.8–10.6)

## 2015-07-17 LAB — BASIC METABOLIC PANEL
Anion gap: 6 (ref 5–15)
BUN: 11 mg/dL (ref 6–20)
CO2: 28 mmol/L (ref 22–32)
Calcium: 8.8 mg/dL — ABNORMAL LOW (ref 8.9–10.3)
Chloride: 103 mmol/L (ref 101–111)
Creatinine, Ser: 0.85 mg/dL (ref 0.61–1.24)
GFR calc Af Amer: >60 mL/min (ref >60)
GFR calc non Af Amer: >60 mL/min (ref >60)
Glucose, Bld: 196 mg/dL — ABNORMAL HIGH (ref 65–99)
Potassium: 4.3 mmol/L (ref 3.5–5.1)
Sodium: 137 mmol/L (ref 135–145)

## 2015-07-17 LAB — SURGICAL PCR SCREEN
MRSA, PCR: NEGATIVE
Staphylococcus aureus: NEGATIVE

## 2015-07-17 LAB — TYPE AND SCREEN
ABO/RH(D): A POS
ANTIBODY SCREEN: NEGATIVE

## 2015-07-17 NOTE — Patient Instructions (Signed)
  Your procedure is scheduled on: Friday 07/21/2015 Report to Day Surgery. 2ND FLOOR MEDICAL MALL ENTRANCE To find out your arrival time please call [phone] between 1PM - 3PM on Thursday 07/20/2015.  Remember: Instructions that are not followed completely may result in serious medical risk, up to and including death, or upon the discretion of your surgeon and anesthesiologist your surgery may need to be rescheduled.    __X__ 1. Do not eat food or drink liquids after midnight. No gum chewing or hard candies.     __X__ 2. No Alcohol for 24 hours before or after surgery.   ____ 3. Bring all medications with you on the day of surgery if instructed.    __X__ 4. Notify your doctor if there is any change in your medical condition     (cold, fever, infections).     Do not wear jewelry, make-up, hairpins, clips or nail polish.  Do not wear lotions, powders, or perfumes.   Do not shave 48 hours prior to surgery. Men may shave face and neck.  Do not bring valuables to the hospital.    [facility] is not responsible for any belongings or valuables.               Contacts, dentures or bridgework may not be worn into surgery.  Leave your suitcase in the car. After surgery it may be brought to your room.  For patients admitted to the hospital, discharge time is determined by your                treatment team.   Patients discharged the day of surgery will not be allowed to drive home.   Please read over the following fact sheets that you were given:   MRSA Information and Surgical Site Infection Prevention   ____ Take these medicines the morning of surgery with A SIP OF WATER:    1.   2.   3.   4.  5.  6.  ____ Fleet Enema (as directed)   __X__ Use CHG Soap as directed  ____ Use inhalers on the day of surgery  ____ Stop metformin 2 days prior to surgery    ____ Take 1/2 of usual insulin dose the night before surgery and none on the morning of surgery.  NO INSULIN ON THE DAY OF  SURGERY  ____ Stop Coumadin/Plavix/aspirin on   ____ Stop Anti-inflammatories on    ____ Stop supplements until after surgery.    ____ Bring C-Pap to the hospital.

## 2015-07-17 NOTE — Consult Note (Signed)
WOC ostomy consult note Stoma type/location: Presurgical session for education regarding upcoming Abdominoperineal Resection and Colostomy.  Patient has met with Dr Bary Castilla and an intended stoma site marking was chosen for LUQ (4 cm left and 7 cm above umbilicus, within rectus muscle). See 07/14/15 note.    Education provided:  Network engineer for colostomy given to patient today and discussion regarding self care.  Patient is concerned with pouch leaking and feeling comfortable with self care.  Discussed that pouch would be emptied several times daily but pouch changed 2-3 times weekly. He is encouraged by this.  We discussed showering, activity level and lifestyle after ostomy.  Emotional support offered and informed that [place] team would see him in the hospital after surgery and would continue education. My phone number (preferred method of communication for patient) is provided and he is encouraged to call me.  Stated that he had some questions written down, but left them at home.  I will continue education and hands on teaching for patient after surgery.  He is given a sample pouch and shown how it is applied.  States he feels some better.  [place] team will continue to follow.  Domenic Moras RN BSN [place] Pager [phone]

## 2015-07-18 LAB — ABO/RH: ABO/RH(D): A POS

## 2015-07-21 ENCOUNTER — Encounter
Admission: RE | Disposition: A | Discharge status: Skilled Nursing Facility | Payer: Self-pay | Source: Ambulatory Visit | Attending: General Surgery

## 2015-07-21 ENCOUNTER — Inpatient Hospital Stay
Admission: RE | Admit: 2015-07-21 | Discharge: 2015-07-27 | DRG: 331 | Disposition: A | Discharge status: Skilled Nursing Facility | Payer: Medicare Other | Source: Ambulatory Visit | Attending: General Surgery | Admitting: General Surgery

## 2015-07-21 ENCOUNTER — Inpatient Hospital Stay: Payer: Medicare Other | Admitting: Anesthesiology

## 2015-07-21 ENCOUNTER — Encounter: Payer: Self-pay | Admitting: *Deleted

## 2015-07-21 DIAGNOSIS — Z9221 Personal history of antineoplastic chemotherapy: Secondary | ICD-10-CM

## 2015-07-21 DIAGNOSIS — Z87891 Personal history of nicotine dependence: Secondary | ICD-10-CM

## 2015-07-21 DIAGNOSIS — D124 Benign neoplasm of descending colon: Secondary | ICD-10-CM | POA: Diagnosis present

## 2015-07-21 DIAGNOSIS — Z923 Personal history of irradiation: Secondary | ICD-10-CM | POA: Diagnosis not present

## 2015-07-21 DIAGNOSIS — C20 Malignant neoplasm of rectum: Principal | ICD-10-CM | POA: Diagnosis present

## 2015-07-21 DIAGNOSIS — D123 Benign neoplasm of transverse colon: Secondary | ICD-10-CM | POA: Diagnosis present

## 2015-07-21 DIAGNOSIS — G4700 Insomnia, unspecified: Secondary | ICD-10-CM | POA: Diagnosis present

## 2015-07-21 DIAGNOSIS — E119 Type 2 diabetes mellitus without complications: Secondary | ICD-10-CM | POA: Diagnosis present

## 2015-07-21 DIAGNOSIS — I10 Essential (primary) hypertension: Secondary | ICD-10-CM | POA: Diagnosis present

## 2015-07-21 DIAGNOSIS — D122 Benign neoplasm of ascending colon: Secondary | ICD-10-CM | POA: Diagnosis not present

## 2015-07-21 DIAGNOSIS — H9190 Unspecified hearing loss, unspecified ear: Secondary | ICD-10-CM | POA: Diagnosis present

## 2015-07-21 DIAGNOSIS — Z794 Long term (current) use of insulin: Secondary | ICD-10-CM | POA: Diagnosis not present

## 2015-07-21 DIAGNOSIS — Z8249 Family history of ischemic heart disease and other diseases of the circulatory system: Secondary | ICD-10-CM

## 2015-07-21 HISTORY — PX: ABDOMINAL PERINEAL BOWEL RESECTION: SHX1111

## 2015-07-21 LAB — GLUCOSE, CAPILLARY
GLUCOSE-CAPILLARY: 136 mg/dL — AB (ref 65–99)
GLUCOSE-CAPILLARY: 220 mg/dL — AB (ref 65–99)
GLUCOSE-CAPILLARY: 284 mg/dL — AB (ref 65–99)

## 2015-07-21 SURGERY — PROCTECTOMY, ABDOMINOPERINEAL
Anesthesia: Epidural | Wound class: Clean Contaminated

## 2015-07-21 MED ORDER — DEXTROSE IN LACTATED RINGERS 5 % IV SOLN
INTRAVENOUS | Status: DC
  Administered 2015-07-21 – 2015-07-24 (×8): via INTRAVENOUS

## 2015-07-21 MED ORDER — MORPHINE SULFATE (PF) 2 MG/ML IV SOLN
2.0000 mg | INTRAVENOUS | Status: DC | PRN
  Administered 2015-07-23 – 2015-07-25 (×5): 2 mg via INTRAVENOUS
  Filled 2015-07-21 (×5): qty 1

## 2015-07-21 MED ORDER — PROPOFOL 10 MG/ML IV BOLUS
INTRAVENOUS | Status: DC | PRN
  Administered 2015-07-21: 150 mg via INTRAVENOUS

## 2015-07-21 MED ORDER — SODIUM CHLORIDE 0.9 % IV SOLN
INTRAVENOUS | Status: DC
  Administered 2015-07-21: 08:00:00 via INTRAVENOUS

## 2015-07-21 MED ORDER — SODIUM CHLORIDE 0.9 % IJ SOLN: 3.0000 mL | INTRAMUSCULAR | Status: DC | PRN

## 2015-07-21 MED ORDER — SODIUM CHLORIDE 0.9 % IR SOLN
Status: DC | PRN
  Administered 2015-07-21: 1500 mL

## 2015-07-21 MED ORDER — PHENYLEPHRINE HCL 10 MG/ML IJ SOLN
INTRAMUSCULAR | Status: DC | PRN
  Administered 2015-07-21 (×4): 100 ug via INTRAVENOUS
  Administered 2015-07-21: 200 ug via INTRAVENOUS

## 2015-07-21 MED ORDER — MENTHOL 3 MG MT LOZG
1.0000 | LOZENGE | OROMUCOSAL | Status: DC | PRN
  Administered 2015-07-22 – 2015-07-26 (×4): 3 mg via ORAL
  Filled 2015-07-21 (×2): qty 9

## 2015-07-21 MED ORDER — IBUPROFEN 600 MG PO TABS
600.0000 mg | ORAL_TABLET | Freq: Four times a day (QID) | ORAL | Status: DC | PRN
  Administered 2015-07-22 (×2): 600 mg via ORAL
  Filled 2015-07-21 (×2): qty 1

## 2015-07-21 MED ORDER — FUROSEMIDE 10 MG/ML IJ SOLN
20.0000 mg | Freq: Once | INTRAMUSCULAR | Status: AC
  Administered 2015-07-21: 20 mg via INTRAVENOUS
  Filled 2015-07-21: qty 2

## 2015-07-21 MED ORDER — ONDANSETRON HCL 4 MG/2ML IJ SOLN
INTRAMUSCULAR | Status: DC | PRN
  Administered 2015-07-21: 4 mg via INTRAVENOUS

## 2015-07-21 MED ORDER — FAMOTIDINE 20 MG PO TABS
20.0000 mg | ORAL_TABLET | Freq: Once | ORAL | Status: AC
  Administered 2015-07-21: 20 mg via ORAL

## 2015-07-21 MED ORDER — FENTANYL CITRATE (PF) 100 MCG/2ML IJ SOLN
INTRAMUSCULAR | Status: DC | PRN
  Administered 2015-07-21: 150 ug via INTRAVENOUS
  Administered 2015-07-21: 50 ug via INTRAVENOUS

## 2015-07-21 MED ORDER — FENTANYL CITRATE (PF) 100 MCG/2ML IJ SOLN: 25.0000 ug | INTRAMUSCULAR | Status: DC | PRN

## 2015-07-21 MED ORDER — FENTANYL 2.5 MCG/ML W/ROPIVACAINE 0.2% IN NS 100 ML EPIDURAL INFUSION (ARMC-ANES)
6.0000 mL/h | EPIDURAL | Status: DC
  Administered 2015-07-21: 10 mL/h via EPIDURAL
  Administered 2015-07-22 (×2): 8 mL/h via EPIDURAL
  Administered 2015-07-23: 6 mL/h via EPIDURAL
  Filled 2015-07-21 (×3): qty 100

## 2015-07-21 MED ORDER — ROPIVACAINE HCL 2 MG/ML IJ SOLN
INTRAMUSCULAR | Status: DC | PRN
  Administered 2015-07-21 (×4): 10 mg via EPIDURAL

## 2015-07-21 MED ORDER — SODIUM CHLORIDE 0.9 % IV SOLN
1.0000 g | INTRAVENOUS | Status: AC
  Administered 2015-07-21: 1000 mg via INTRAVENOUS
  Filled 2015-07-21: qty 1

## 2015-07-21 MED ORDER — NALBUPHINE HCL 10 MG/ML IJ SOLN
5.0000 mg | INTRAMUSCULAR | Status: DC | PRN
  Filled 2015-07-21: qty 0.5

## 2015-07-21 MED ORDER — ALVIMOPAN 12 MG PO CAPS
12.0000 mg | ORAL_CAPSULE | Freq: Two times a day (BID) | ORAL | Status: DC
  Administered 2015-07-21 – 2015-07-24 (×7): 12 mg via ORAL
  Filled 2015-07-21 (×7): qty 1

## 2015-07-21 MED ORDER — INSULIN ASPART 100 UNIT/ML ~~LOC~~ SOLN
0.0000 [IU] | Freq: Three times a day (TID) | SUBCUTANEOUS | Status: DC
  Administered 2015-07-21: 3 [IU] via SUBCUTANEOUS
  Administered 2015-07-22: 5 [IU] via SUBCUTANEOUS
  Administered 2015-07-22: 3 [IU] via SUBCUTANEOUS
  Filled 2015-07-21 (×2): qty 3
  Filled 2015-07-21: qty 5

## 2015-07-21 MED ORDER — DEXTROSE IN LACTATED RINGERS 5 % IV SOLN: INTRAVENOUS | Status: DC

## 2015-07-21 MED ORDER — ACETAMINOPHEN 325 MG PO TABS: 650.0000 mg | ORAL_TABLET | Freq: Four times a day (QID) | ORAL | Status: DC | PRN

## 2015-07-21 MED ORDER — ACETAMINOPHEN 10 MG/ML IV SOLN
INTRAVENOUS | Status: AC
  Filled 2015-07-21: qty 100

## 2015-07-21 MED ORDER — SUGAMMADEX SODIUM 200 MG/2ML IV SOLN
INTRAVENOUS | Status: DC | PRN
  Administered 2015-07-21: 339.2 mg via INTRAVENOUS

## 2015-07-21 MED ORDER — DIPHENHYDRAMINE HCL 25 MG PO CAPS: 25.0000 mg | ORAL_CAPSULE | ORAL | Status: DC | PRN

## 2015-07-21 MED ORDER — FAMOTIDINE 20 MG PO TABS
ORAL_TABLET | ORAL | Status: AC
  Administered 2015-07-21: 20 mg via ORAL
  Filled 2015-07-21: qty 1

## 2015-07-21 MED ORDER — ACETAMINOPHEN 10 MG/ML IV SOLN
INTRAVENOUS | Status: DC | PRN
  Administered 2015-07-21: 1000 mg via INTRAVENOUS

## 2015-07-21 MED ORDER — DIPHENHYDRAMINE HCL 50 MG/ML IJ SOLN
12.5000 mg | INTRAMUSCULAR | Status: DC | PRN
  Administered 2015-07-22: 12.5 mg via INTRAVENOUS
  Filled 2015-07-21: qty 1

## 2015-07-21 MED ORDER — ONDANSETRON HCL 4 MG/2ML IJ SOLN
4.0000 mg | Freq: Once | INTRAMUSCULAR | Status: AC | PRN
  Administered 2015-07-21: 4 mg via INTRAVENOUS

## 2015-07-21 MED ORDER — ENOXAPARIN SODIUM 40 MG/0.4ML ~~LOC~~ SOLN: 40.0000 mg | SUBCUTANEOUS | Status: DC

## 2015-07-21 MED ORDER — PROMETHAZINE HCL 25 MG/ML IJ SOLN
6.2500 mg | INTRAMUSCULAR | Status: DC | PRN
  Administered 2015-07-23 (×2): 6.25 mg via INTRAVENOUS
  Filled 2015-07-21 (×2): qty 1

## 2015-07-21 MED ORDER — NALBUPHINE HCL 10 MG/ML IJ SOLN: 5.0000 mg | Freq: Once | INTRAMUSCULAR | Status: DC | PRN

## 2015-07-21 MED ORDER — LIDOCAINE HCL (CARDIAC) 20 MG/ML IV SOLN
INTRAVENOUS | Status: DC | PRN
  Administered 2015-07-21: 100 mg via INTRAVENOUS

## 2015-07-21 MED ORDER — NALBUPHINE HCL 10 MG/ML IJ SOLN: 5.0000 mg | INTRAMUSCULAR | Status: DC | PRN

## 2015-07-21 MED ORDER — SODIUM CHLORIDE 0.9 % IV SOLN
INTRAVENOUS | Status: DC
Start: 2015-07-21 — End: 2015-07-21
  Administered 2015-07-21: 08:00:00 via INTRAVENOUS

## 2015-07-21 MED ORDER — ROPIVACAINE HCL 2 MG/ML IJ SOLN
INTRAMUSCULAR | Status: AC
  Filled 2015-07-21: qty 20

## 2015-07-21 MED ORDER — MEPERIDINE HCL 25 MG/ML IJ SOLN: 6.2500 mg | INTRAMUSCULAR | Status: DC | PRN

## 2015-07-21 MED ORDER — MIDAZOLAM HCL 2 MG/2ML IJ SOLN
INTRAMUSCULAR | Status: DC | PRN
  Administered 2015-07-21 (×2): 1 mg via INTRAVENOUS

## 2015-07-21 MED ORDER — SCOPOLAMINE 1 MG/3DAYS TD PT72
1.0000 | MEDICATED_PATCH | Freq: Once | TRANSDERMAL | Status: DC
  Filled 2015-07-21: qty 1

## 2015-07-21 MED ORDER — DEXTROSE 5 % IV SOLN: 1.0000 ug/kg/h | INTRAVENOUS | Status: DC | PRN

## 2015-07-21 MED ORDER — NALOXONE HCL 0.4 MG/ML IJ SOLN
0.4000 mg | INTRAMUSCULAR | Status: DC | PRN
Start: 2015-07-21 — End: 2015-07-25

## 2015-07-21 MED ORDER — ROCURONIUM BROMIDE 100 MG/10ML IV SOLN
INTRAVENOUS | Status: DC | PRN
  Administered 2015-07-21 (×4): 10 mg via INTRAVENOUS
  Administered 2015-07-21: 50 mg via INTRAVENOUS

## 2015-07-21 MED ORDER — ONDANSETRON HCL 4 MG/2ML IJ SOLN
INTRAMUSCULAR | Status: AC
  Filled 2015-07-21: qty 2

## 2015-07-21 MED ORDER — ACETAMINOPHEN 650 MG RE SUPP: 650.0000 mg | Freq: Four times a day (QID) | RECTAL | Status: DC | PRN

## 2015-07-21 MED ORDER — ONDANSETRON HCL 4 MG/2ML IJ SOLN
4.0000 mg | Freq: Three times a day (TID) | INTRAMUSCULAR | Status: DC | PRN
  Administered 2015-07-22 – 2015-07-24 (×5): 4 mg via INTRAVENOUS
  Filled 2015-07-21 (×6): qty 2

## 2015-07-21 MED ORDER — LACTATED RINGERS IV SOLN
INTRAVENOUS | Status: DC | PRN
  Administered 2015-07-21: 09:00:00 via INTRAVENOUS

## 2015-07-21 SURGICAL SUPPLY — 76 items
APPLIER CLIP 11 MED OPEN (CLIP)
APPLIER CLIP 13 LRG OPEN (CLIP)
BAG COUNTER SPONGE EZ (MISCELLANEOUS) IMPLANT
BARRIER SKIN 2 1/4 (WOUND CARE) ×2 IMPLANT
BARRIER SKIN 2 1/4INCH (WOUND CARE) ×1
BENZOIN TINCTURE PRP APPL 2/3 (GAUZE/BANDAGES/DRESSINGS) IMPLANT
BLADE SURG 10 STRL SS SAFETY (BLADE) ×6 IMPLANT
BRIEF STRETCH MATERNITY 2XLG (MISCELLANEOUS) ×3 IMPLANT
BULB RESERV EVAC DRAIN JP 100C (MISCELLANEOUS) ×3 IMPLANT
CANISTER SUCT 1200ML W/VALVE (MISCELLANEOUS) ×3 IMPLANT
CATH FOLEY 2WAY  5CC 16FR (CATHETERS)
CATH URTH 16FR FL 2W BLN LF (CATHETERS) IMPLANT
CHLORAPREP W/TINT 26ML (MISCELLANEOUS) ×3 IMPLANT
CLIP APPLIE 11 MED OPEN (CLIP) IMPLANT
CLIP APPLIE 13 LRG OPEN (CLIP) IMPLANT
COUNTER SPONGE BAG EZ (MISCELLANEOUS)
COVER CLAMP SIL LG PBX B (MISCELLANEOUS) IMPLANT
DRAIN CHANNEL JP 15F RND 16 (MISCELLANEOUS) ×3 IMPLANT
DRAPE LAP W/FLUID (DRAPES) ×3 IMPLANT
DRAPE MAG INST 16X20 L/F (DRAPES) IMPLANT
DRAPE TABLE BACK 80X90 (DRAPES) IMPLANT
DRAPE UNDER BUTTOCK W/FLU (DRAPES) ×3 IMPLANT
DRSG OPSITE POSTOP 4X10 (GAUZE/BANDAGES/DRESSINGS) ×3 IMPLANT
DRSG OPSITE POSTOP 4X12 (GAUZE/BANDAGES/DRESSINGS) ×3 IMPLANT
DRSG OPSITE POSTOP 4X14 (GAUZE/BANDAGES/DRESSINGS) IMPLANT
DRSG TEGADERM 4X4.75 (GAUZE/BANDAGES/DRESSINGS) ×3 IMPLANT
ELECT BLADE 6.5 EXT (BLADE) ×3 IMPLANT
GLOVE BIO SURGEON STRL SZ7 (GLOVE) ×6 IMPLANT
GLOVE BIO SURGEON STRL SZ7.5 (GLOVE) ×24 IMPLANT
GLOVE INDICATOR 8.0 STRL GRN (GLOVE) ×24 IMPLANT
GOWN STRL REUS W/ TWL LRG LVL3 (GOWN DISPOSABLE) ×10 IMPLANT
GOWN STRL REUS W/TWL LRG LVL3 (GOWN DISPOSABLE) ×20
HANDLE YANKAUER SUCT BULB TIP (MISCELLANEOUS) IMPLANT
HARMONIC SCALPEL FOCUS (MISCELLANEOUS) IMPLANT
KIT RM TURNOVER CYSTO AR (KITS) ×3 IMPLANT
LABEL OR SOLS (LABEL) IMPLANT
NS IRRIG 1000ML POUR BTL (IV SOLUTION) ×12 IMPLANT
PACK BASIN MAJOR ARMC (MISCELLANEOUS) ×3 IMPLANT
PACK BASIN MINOR ARMC (MISCELLANEOUS) ×3 IMPLANT
PAD GROUND ADULT SPLIT (MISCELLANEOUS) ×6 IMPLANT
PAD PREP 24X41 OB/GYN DISP (PERSONAL CARE ITEMS) ×3 IMPLANT
POUCH DRAIN  2 1/4 MED RED 181 (OSTOMY) ×3 IMPLANT
RELOAD LINEAR CUT PROX 55 BLUE (ENDOMECHANICALS) IMPLANT
RELOAD PROXIMATE 75MM BLUE (ENDOMECHANICALS) ×6 IMPLANT
RETAINER VISCERA MED (MISCELLANEOUS) ×3 IMPLANT
SHEARS HARMONIC 23CM COAG (MISCELLANEOUS) IMPLANT
SOL PREP PVP 2OZ (MISCELLANEOUS) ×3
SOLUTION PREP PVP 2OZ (MISCELLANEOUS) ×1 IMPLANT
SPONGE KITTNER 5P (MISCELLANEOUS) ×3 IMPLANT
SPONGE LAP 18X18 5 PK (GAUZE/BANDAGES/DRESSINGS) ×3 IMPLANT
STAPLER CUT RELOAD BLUE (STAPLE) IMPLANT
STAPLER CUT RELOAD GREEN (STAPLE) IMPLANT
STAPLER PROXIMATE 75MM BLUE (STAPLE) ×3 IMPLANT
STAPLER SKIN PROX 35W (STAPLE) ×3 IMPLANT
SURGILUBE 2OZ TUBE FLIPTOP (MISCELLANEOUS) ×3 IMPLANT
SUT ETH BLK MONO 3 0 FS 1 12/B (SUTURE) IMPLANT
SUT ETHILON 2 0 FS 18 (SUTURE) ×3 IMPLANT
SUT PROLENE 0 CT 1 30 (SUTURE) ×9 IMPLANT
SUT SILK 0 SH 30 (SUTURE) ×6 IMPLANT
SUT SILK 2 0 (SUTURE) ×2
SUT SILK 2 0 SH (SUTURE) IMPLANT
SUT SILK 2-0 30XBRD TIE 12 (SUTURE) ×1 IMPLANT
SUT SILK 3 0 (SUTURE)
SUT SILK 3-0 18XBRD TIE 12 (SUTURE) IMPLANT
SUT VIC AB 0 CT1 36 (SUTURE) ×6 IMPLANT
SUT VIC AB 2-0 BRD 54 (SUTURE) IMPLANT
SUT VIC AB 2-0 CT1 27 (SUTURE) ×4
SUT VIC AB 2-0 CT1 TAPERPNT 27 (SUTURE) ×2 IMPLANT
SUT VIC AB 2-0 CT2 27 (SUTURE) ×3 IMPLANT
SUT VIC AB 3-0 54X BRD REEL (SUTURE) ×1 IMPLANT
SUT VIC AB 3-0 BRD 54 (SUTURE) ×2
SUT VIC AB 3-0 SH 27 (SUTURE) ×16
SUT VIC AB 3-0 SH 27X BRD (SUTURE) ×8 IMPLANT
SUT VIC AB 4-0 PS2 18 (SUTURE) IMPLANT
SYR BULB IRRIG 60ML STRL (SYRINGE) ×3 IMPLANT
TAPE TRANSPORE STRL 2 31045 (GAUZE/BANDAGES/DRESSINGS) IMPLANT

## 2015-07-21 NOTE — Op Note (Signed)
Preoperative diagnosis: Rectal cancer status post neo-adjuvant chemoradiation; multiple polyps of the right colon. Postoperative diagnosis: Same.  Operative procedure: Abdominal perineal resection with permanent end colostomy, right hemicolectomy with ileocolostomy.  Operating surgeons Hervey Ard M.D., Ellwood Sayers, M.D.  Anesthesia: Gen. endotracheal/epidural.  Estimated blood loss 400 mL.  Fluid replacement 3000 mL crystalloid.  Medical note: This 74 year old male was noted with rectal cancer 3 years ago. He was lost to follow-up until earlier this year when he returned with incontinence due to a fungating mass protruding through the anus. He is undergone U I-Jen chemoradiation with good response. Preoperative colonoscopy showed extensive polyps in the right colon with scattered: Polyps in the transverse and descending colon. He was evaluated at [facility] and found not to be a candidate for a ileoanal anastomosis, and it was elected to leave a portion of the colon to minimize volume loss is an elderly diabetic.  Patient underwent formal bowel prep prior to procedure. He received and draped prior to the procedure. Colbert Ewing was administered on induction of anesthesia.  Operative note with the patient under general anesthesia with an epidural in place he was placed in dorsal lithotomy position with appropriate padding. A warming blanket was utilized. Foley catheter was placed by the nurse. The abdomen was prepped with chlor prep and the perineum prepped with Betadine solution. A generous lower midline incision extending above the level of the umbilicus was made and carried down to skin and subcutaneous venous tissue. Dissection was completed electrocautery below the level of the skin. The fascia was opened and the abdomen explored. No evidence of liver metastases. Some changes evidence of previous radiation in the pelvis. The dominant polyp in the ascending colon was palpable.  The lateral attachments the  sigmoid colon were freed and the midportion of the sigmoid colon divided with a GIA stapler. The mesentery was divided with hemostasis achieved by the Harmonic scalpel. Main vessels controlled with 2-0 silk ties. The sacral promontory was exposed and the plane between the visceral and parietal peritoneum of the mesial rectum was identified. A total mesorectal excision was completed making use of Harmonic scalpel for hemostasis. The incision extended out to the lateral pelvic sidewalls. The anterior dissection between the rectum and the prostate was somewhat tedious to the previous radiation. After the dissection up and carried down to the levators posteriorly and laterally attention was turned to the perineum. While I completed the perineal portion of the procedure Dr. Jamal Collin proceeded with a right hemicolectomy.  The right colectomy was completed by mobilizing the right colon. The duodenum was identified and protected. A side-to-side functional end-to-end staple anastomosis with the GIA stapler was completed with 2 applications. Mesenteric defect was closed with 3-0 Vicryls running suture. The corners of the anastomosis were reinforced with interrupted 3-0 silk seromuscular sutures.  The perineal incision was made to encompass the anus and a 3 cm radius. The skin was incised sharply and the Raney dissection completed with electrocautery. The adipose layer was divided as were the predicted the pudendal vessels. The leave a later was incised and the rectosigmoid brought to the perineum. With traction the anterior wall was freed circumferentially making use of cautery dissection. Final hemostasis was with 3-0 Vicryls figure-of-eight sutures. Most the small bleeding points from the prosthetic capsule. The area was irrigated and good hemostasis noted. A 15 [place] Blake drain was put in the perineal wound and brought out through a separate stab wound incision. This was anchored in place with 2-0 nylon. The adipose  layer was closed with multiple layers of 2-0 Vicryls figure-of-eight sutures. The skin was closed with a running 3-0 Vicryls subcuticular suture.  A stoma was created 4 cm lateral and 7 cm superior to the umbilicus as previously marked. This was brought to the rectus fascia. The stoma was transfixed to the fascia and the anterior rectus layer with interrupted 3-0 Vicryls sutures. At this time the surgeon's gowns and gloves and drapes were changed. The  peritoneum was closed with a running 2-0 Vicryls suture. The fascia was closed with an interrupted 0 proline figure-of-eight suture. The wound was irrigated. Adipose layer was approximated with a running 2-0 Vicryls suture. The skin was closed with staples. A honeycomb dressing was applied.  The stoma was then matured with 3-0 Vicryls seromuscular bites 2 cm back, full-thickness on the edge of the stoma insert and subcuticular on the skin. It easily accepted the index finger. A colostomy plate was placed. The patient was transported to recovery in stable condition.

## 2015-07-21 NOTE — Anesthesia Postprocedure Evaluation (Signed)
  Anesthesia Post-op Note  Patient: Gary Hampton  Procedure(s) Performed: Procedure(s): Removal of the rectum, anus, and right colon; formation of permanent colostomy (N/A)  Anesthesia type:General, Epidural  Patient location: PACU  Post pain: Pain level controlled  Post assessment: Post-op Vital signs reviewed, Patient's Cardiovascular Status Stable, Respiratory Function Stable, Patent Airway and No signs of Nausea or vomiting  Post vital signs: Reviewed and stable  Last Vitals:  Filed Vitals:   07/21/15 1418  BP:   Pulse:   Temp:   Resp: 20    Level of consciousness: awake, alert  and patient cooperative  Complications: No apparent anesthesia complications

## 2015-07-21 NOTE — Anesthesia Preprocedure Evaluation (Signed)
Anesthesia Evaluation  Patient identified by MRN, date of birth, ID band Patient awake    Reviewed: Allergy & Precautions, NPO status , Patient's Chart, lab work & pertinent test results  History of Anesthesia Complications Negative for: history of anesthetic complications  Airway Mallampati: II  TM Distance: >3 FB Neck ROM: Full    Dental  (+) Implants   Pulmonary former smoker (quit x 40 yrs),           Cardiovascular negative cardio ROS       Neuro/Psych negative neurological ROS     GI/Hepatic negative GI ROS, Neg liver ROS,   Endo/Other  diabetes, Insulin Dependent  Renal/GU negative Renal ROS     Musculoskeletal   Abdominal   Peds  Hematology   Anesthesia Other Findings   Reproductive/Obstetrics                             Anesthesia Physical Anesthesia Plan  ASA: III  Anesthesia Plan: General and Epidural   Post-op Pain Management:    Induction: Intravenous  Airway Management Planned: Oral ETT  Additional Equipment:   Intra-op Plan:   Post-operative Plan:   Informed Consent: I have reviewed the patients History and Physical, chart, labs and discussed the procedure including the risks, benefits and alternatives for the proposed anesthesia with the patient or authorized representative who has indicated his/her understanding and acceptance.     Plan Discussed with:   Anesthesia Plan Comments:         Anesthesia Quick Evaluation

## 2015-07-21 NOTE — H&P (Signed)
Patient with rectal cancer status post neo-adjuvant chemotherapy. Previously identified multiple right colon polyps. 4 planned dominant perineal resection and right hemicolectomy with permanent colostomy formation.  The likelihood of impotence post procedure has been reviewed with the patient in the office.  Risks have been reviewed.

## 2015-07-21 NOTE — Anesthesia Procedure Notes (Addendum)
Epidural Patient location during procedure: OR Start time: 07/21/2015 9:00 AM End time: 07/21/2015 9:06 AM  Staffing Performed by: anesthesiologist   Preanesthetic Checklist Completed: patient identified, site marked, surgical consent, pre-op evaluation, timeout performed, IV checked, risks and benefits discussed, monitors and equipment checked and post-op pain management  Epidural Patient position: sitting Prep: Betadine Patient monitoring: heart rate, cardiac monitor, continuous pulse ox and blood pressure Approach: midline Location: L1-L2 Injection technique: LOR saline  Needle:  Needle type: Tuohy  Needle gauge: 18 G Needle length: 9 cm Needle insertion depth: 7 cm Catheter type: closed end Catheter size: 20 Guage Catheter at skin depth: 9 cm Test dose: negative and 1.5% lidocaine with Epi 1:200 K  Assessment Events: blood not aspirated  Additional Notes Reason for block:post-op pain management  Procedure Name: Intubation Date/Time: 07/21/2015 9:12 AM Performed by: Justus Memory Pre-anesthesia Checklist: Patient identified, Emergency Drugs available, Suction available, Patient being monitored and Timeout performed Patient Re-evaluated:Patient Re-evaluated prior to inductionOxygen Delivery Method: Circle system utilized Preoxygenation: Pre-oxygenation with 100% oxygen Intubation Type: IV induction Ventilation: Mask ventilation without difficulty Laryngoscope Size: Mac and 3 Grade View: Grade II Tube type: Oral Number of attempts: 1 Airway Equipment and Method: Stylet Placement Confirmation: ETT inserted through vocal cords under direct vision,  positive ETCO2,  CO2 detector and breath sounds checked- equal and bilateral Secured at: 21 cm Tube secured with: Tape Dental Injury: Teeth and Oropharynx as per pre-operative assessment

## 2015-07-21 NOTE — Consult Note (Signed)
[facility] [place] NAME: Becky Berberian    MR#:  [id number]  DATE OF BIRTH:  03-Sep-1941  DATE OF ADMISSION:  07/21/2015  PRIMARY CARE PHYSICIAN: Albina Billet, MD   REQUESTING/REFERRING PHYSICIAN: Dr Bary Castilla  CHIEF COMPLAINT:  Medical management of diabetes.   HISTORY OF PRESENT ILLNESS:  Cyril Woodmansee  is a 74 y.o. male with a known history of rectal cancer 3 years ago. He saw Dr. Bary Castilla due to of the fungating mass protruding through the annulus. He had undergone chemoradiation with good response. Patient recently underwent preoperative colonoscopy showed extensive extensive polyps in the right colon with scattered polyps in transverse and descending colon. He underwent abdominoperineal resection with permanent end colostomy and right hemicolectomy with ileal colostomy done by Dr. Tollie Pizza today. Patient was seen and PACU. He is somewhat sleepy. He is hemodynamically stable. Internal medicine was consulted for management of diabetes. Patient reports taking only insulin. He is not on any by mouth meds.  PAST MEDICAL HISTORY:   Past Medical History  Diagnosis Date  . Diabetes mellitus without complication   . Colon polyp   . Hard of hearing   . Cancer     Rectal    PAST SURGICAL HISTOIRY:   Past Surgical History  Procedure Laterality Date  . Colonoscopy  2013    Dr. Dionne Milo  . Cataract extraction Right   . No past surgeries    . Portacath placement Right 03/09/2015    Procedure: INSERTION PORT-A-CATH;  Surgeon: Robert Bellow, MD;  Location: [facility] ORS;  Service: General;  Laterality: Right;  . Flexible sigmoidoscopy N/A 03/09/2015    Procedure: FLEXIBLE SIGMOIDOSCOPY/rectal biopsy;  Surgeon: Robert Bellow, MD;  Location: [facility] ORS;  Service: General;  Laterality: N/A;  . Colonoscopy N/A 03/15/2015    Procedure: COLONOSCOPY;  Surgeon: Robert Bellow, MD;  Location: [facility] ENDOSCOPY;  Service: Endoscopy;  Laterality:  N/A;    SOCIAL HISTORY:   Social History  Substance Use Topics  . Smoking status: Former Smoker -- 1.00 packs/day for 3 years    Types: Cigarettes, Pipe, Cigars    Quit date: 10/28/1978  . Smokeless tobacco: Never Used  . Alcohol Use: 0.0 oz/week    0 Standard drinks or equivalent per week     Comment: 3/day    FAMILY HISTORY:  htn  DRUG ALLERGIES:  No Known Allergies  REVIEW OF SYSTEMS:   ROS  CONSTITUTIONAL: No fever, fatigue or weakness.  EYES: No blurred or double vision.  EARS, NOSE, AND THROAT: No tinnitus or ear pain.  RESPIRATORY: No cough, shortness of breath, wheezing or hemoptysis.  CARDIOVASCULAR: No chest pain, orthopnea, edema.  GASTROINTESTINAL: No nausea, vomiting, diarrhea , positive for abdominal pain.  GENITOURINARY: No dysuria, hematuria.  ENDOCRINE: No polyuria, nocturia,  HEMATOLOGY: No anemia, easy bruising or bleeding SKIN: No rash or lesion. MUSCULOSKELETAL: No joint pain or arthritis.   NEUROLOGIC: No tingling, numbness, weakness.  PSYCHIATRY: No anxiety or depression.   MEDICATIONS AT HOME:   Prior to Admission medications   Medication Sig Start Date End Date Taking? Authorizing Provider  INSULIN ASPART [place] Inject 20 Units into the skin daily with lunch.    Yes Historical Provider, MD  loperamide (IMODIUM A-D) 2 MG tablet Take 2 mg by mouth 4 (four) times daily as needed for diarrhea or loose stools.   Yes Historical Provider, MD  polyethylene glycol powder (MIRALAX) powder Take 255 g by mouth once. 07/14/15  Yes Robert Bellow, MD  LANTUS SOLOSTAR 100 UNIT/ML Solostar Pen Inject 20 Units into the skin daily with lunch.  06/01/15   Historical Provider, MD      VITAL SIGNS:  Blood pressure 99/49, pulse 72, temperature 99.6 F (37.6 C), temperature source Tympanic, resp. rate 21, height 6' (1.829 m), weight 84.823 kg (187 lb), SpO2 100 %.  PHYSICAL EXAMINATION:  GENERAL:  74 y.o.-year-old patient lying in the bed with no acute distress.   EYES: Pupils equal, round, reactive to light and accommodation. No scleral icterus. Extraocular muscles intact.  HEENT: Head atraumatic, normocephalic. Oropharynx and nasopharynx clear.  NECK:  Supple, no jugular venous distention. No thyroid enlargement, no tenderness.  LUNGS: Normal breath sounds bilaterally, no wheezing, rales,rhonchi or crepitation. No use of accessory muscles of respiration.  CARDIOVASCULAR: S1, S2 normal. No murmurs, rubs, or gallops.  ABDOMEN: Soft, nontender, nondistended. Bowel sounds present. No organomegaly or mass. Surgical dressing = colostomy+ EXTREMITIES: No pedal edema, cyanosis, or clubbing.  NEUROLOGIC: Cranial nerves II through XII are intact. Muscle strength 5/5 in all extremities. Sensation intact. Gait not checked.  PSYCHIATRIC:  patient is alert and oriented x 3.  SKIN: No obvious rash, lesion, or ulcer.   LABORATORY PANEL:   CBC  Recent Labs Lab 07/17/15 1518  WBC 4.4  HGB 15.5  HCT 45.1  PLT 183   ------------------------------------------------------------------------------------------------------------------  Chemistries   Recent Labs Lab 07/17/15 1518  NA 137  K 4.3  CL 103  CO2 28  GLUCOSE 196*  BUN 11  CREATININE 0.85  CALCIUM 8.8*   ------------------------------------------------------------------------------------------------------------------  Cardiac Enzymes No results for input(s): TROPONINI in the last 168 hours. ------------------------------------------------------------------------------------------------------------------  RADIOLOGY:  No results found.  EKG:   Orders placed or performed during the hospital encounter of 03/08/15  . EKG 12-Lead  . EKG 12-Lead    IMPRESSION AND PLAN:   74 year old Mr. Seith with past medical history of anal cancer, type 2 diabetes on insulin, impaired hearing was admitted for elective surgery. He was seen in the PACU. Internal medicine was consulted for management of  diabetes.  #1  diabetes insulin requiring. Patient will be placed on sliding scale insulin for now. We'll initiate insulin once he is by mouth intake is started. Adjust dose of insulin as needed.    #2 history of rectal cancer status post adjuvant chemotherapy status post abdominal perineal resection with permanent end colostomy, right hemicolectomy with ileocolostomy management per surgery.   3. DVT prophylaxis subcutaneous Lovenox. Thank you for the consult will follow while patient is in the hospital.   All th  records are reviewed and case discussed with Consulting provider. Management plans discussed with the patient, family and they are in agreement.  CODE STATUS: Full  TOTAL TIME TAKING CARE OF THIS PATIENT:  45 minutes.    PATEL,SONA M.D on 07/21/2015 at 3:30 PM  Between 7am to 6pm - Pager - [phone]  After 6pm go to [email or web address] - password [email or web address] [facility]  Office  [phone]  CC: Primary care Physician: Albina Billet, MD

## 2015-07-21 NOTE — Transfer of Care (Signed)
Immediate Anesthesia Transfer of Care Note  Patient: Gary Hampton  Procedure(s) Performed: Procedure(s): Removal of the rectum, anus, and right colon; formation of permanent colostomy (N/A)  Patient Location: PACU  Anesthesia Type:General  Level of Consciousness: awake, alert  and oriented  Airway & Oxygen Therapy: Patient Spontanous Breathing and Patient connected to face mask oxygen  Post-op Assessment: Report given to RN and Post -op Vital signs reviewed and stable  Post vital signs: Reviewed  Last Vitals:  Filed Vitals:   07/21/15 1347  BP: 103/66  Pulse: 73  Temp: 37.6 C  Resp: 20    Complications: No apparent anesthesia complications

## 2015-07-22 LAB — BASIC METABOLIC PANEL
Anion gap: 4 — ABNORMAL LOW (ref 5–15)
BUN: 14 mg/dL (ref 6–20)
CALCIUM: 7.6 mg/dL — AB (ref 8.9–10.3)
CO2: 26 mmol/L (ref 22–32)
CREATININE: 0.98 mg/dL (ref 0.61–1.24)
Chloride: 106 mmol/L (ref 101–111)
Glucose, Bld: 279 mg/dL — ABNORMAL HIGH (ref 65–99)
Potassium: 3.9 mmol/L (ref 3.5–5.1)
SODIUM: 136 mmol/L (ref 135–145)

## 2015-07-22 LAB — CBC
HCT: 33.2 % — ABNORMAL LOW (ref 40.0–52.0)
Hemoglobin: 11.3 g/dL — ABNORMAL LOW (ref 13.0–18.0)
MCH: 33 pg (ref 26.0–34.0)
MCHC: 34.2 g/dL (ref 32.0–36.0)
MCV: 96.5 fL (ref 80.0–100.0)
PLATELETS: 161 10*3/uL (ref 150–440)
RBC: 3.44 MIL/uL — ABNORMAL LOW (ref 4.40–5.90)
RDW: 12.8 % (ref 11.5–14.5)
WBC: 8.4 10*3/uL (ref 3.8–10.6)

## 2015-07-22 LAB — GLUCOSE, CAPILLARY
GLUCOSE-CAPILLARY: 273 mg/dL — AB (ref 65–99)
GLUCOSE-CAPILLARY: 199 mg/dL — AB (ref 65–99)
Glucose-Capillary: 247 mg/dL — ABNORMAL HIGH (ref 65–99)
Glucose-Capillary: 220 mg/dL — ABNORMAL HIGH (ref 65–99)

## 2015-07-22 MED ORDER — ZOLPIDEM TARTRATE 5 MG PO TABS: 5.0000 mg | ORAL_TABLET | Freq: Every evening | ORAL | Status: DC | PRN

## 2015-07-22 MED ORDER — INSULIN ASPART 100 UNIT/ML ~~LOC~~ SOLN
0.0000 [IU] | Freq: Three times a day (TID) | SUBCUTANEOUS | Status: DC
  Administered 2015-07-22: 5 [IU] via SUBCUTANEOUS
  Administered 2015-07-23: 3 [IU] via SUBCUTANEOUS
  Administered 2015-07-23: 8 [IU] via SUBCUTANEOUS
  Administered 2015-07-23 – 2015-07-24 (×2): 5 [IU] via SUBCUTANEOUS
  Administered 2015-07-24: 8 [IU] via SUBCUTANEOUS
  Administered 2015-07-24 – 2015-07-26 (×5): 3 [IU] via SUBCUTANEOUS
  Administered 2015-07-26 – 2015-07-27 (×3): 5 [IU] via SUBCUTANEOUS
  Administered 2015-07-27: 8 [IU] via SUBCUTANEOUS
  Filled 2015-07-22: qty 8
  Filled 2015-07-22 (×5): qty 3
  Filled 2015-07-22 (×3): qty 5
  Filled 2015-07-22: qty 8
  Filled 2015-07-22: qty 5
  Filled 2015-07-22: qty 8
  Filled 2015-07-22: qty 5
  Filled 2015-07-22: qty 8
  Filled 2015-07-22: qty 5

## 2015-07-22 NOTE — Progress Notes (Signed)
Spoke with Dr. Ronelle Nigh regarding the pt complaint of his leg feeling very heavy and not being able to lift it. He put in new orders to drop rate down to 6/hr.

## 2015-07-22 NOTE — Progress Notes (Signed)
[place] at [facility]                                                                                                                                                                                            Patient Demographics   Gary Hampton, is a 74 y.o. male, DOB - 1941/02/25, ZYS:[id number]  Admit date - 07/21/2015   Admitting Physician Robert Bellow, MD  Outpatient Primary MD for the patient is Albina Billet, MD   [place] - 1  Subjective: PT DOING OK DENIES SOME ABDOMINAL PAIN     Review of Systems:   CONSTITUTIONAL: No documented fever. No fatigue, weakness. No weight gain, no weight loss.  EYES: No blurry or double vision.  ENT: No tinnitus. No postnasal drip. No redness of the oropharynx.  RESPIRATORY: No cough, no wheeze, no hemoptysis. No dyspnea.  CARDIOVASCULAR: No chest pain. No orthopnea. No palpitations. No syncope.  GASTROINTESTINAL: No nausea, no vomiting or diarrhea. No abdominal pain. No melena or hematochezia.  GENITOURINARY: No dysuria or hematuria.  ENDOCRINE: No polyuria or nocturia. No heat or cold intolerance.  HEMATOLOGY: No anemia. No bruising. No bleeding.  INTEGUMENTARY: No rashes. No lesions.  MUSCULOSKELETAL: No arthritis. No swelling. No gout.  NEUROLOGIC: No numbness, tingling, or ataxia. No seizure-type activity.  PSYCHIATRIC: No anxiety. No insomnia. No ADD.    Vitals:   Filed Vitals:   07/22/15 0036 07/22/15 0618 07/22/15 0851 07/22/15 1336  BP: 129/51 114/53 121/68 129/52  Pulse: 87 94 93 87  Temp: 97.8 F (36.6 C) 98.6 F (37 C) 98.6 F (37 C) 98.3 F (36.8 C)  TempSrc: Oral Oral Oral Oral  Resp:   17 16  Height:      Weight:      SpO2: 98% 95% 95% 100%    Wt Readings from Last 3 Encounters:  07/21/15 87.227 kg (192 lb 4.8 oz)  07/14/15 85.73 kg (189 lb)  06/21/15 83.462 kg (184 lb)     Intake/Output Summary (Last 24 hours) at 07/22/15 1440 Last data filed at 07/22/15 1238  Gross  per 24 hour  Intake 4493.27 ml  Output   1800 ml  Net 2693.27 ml    Physical Exam:   GENERAL: Pleasant-appearing in no apparent distress.  HEAD, EYES, EARS, NOSE AND THROAT: Atraumatic, normocephalic. Extraocular muscles are intact. Pupils equal and reactive to light. Sclerae anicteric. No conjunctival injection. No oro-pharyngeal erythema.  NECK: Supple. There is no jugular venous distention. No bruits, no lymphadenopathy, no thyromegaly.  HEART: Regular rate and rhythm,. No murmurs, no rubs, no clicks.  LUNGS: Clear to auscultation  bilaterally. No rales or rhonchi. No wheezes.  ABDOMEN: Soft, flat, nontender, nondistended.  DECREASED BS EXTREMITIES: No evidence of any cyanosis, clubbing, or peripheral edema.  +2 pedal and radial pulses bilaterally.  NEUROLOGIC: The patient is alert, awake, and oriented x3 with no focal motor or sensory deficits appreciated bilaterally.  SKIN: Moist and warm with no rashes appreciated.  Psych: Not anxious, depressed LN: No inguinal LN enlargement    Antibiotics   Anti-infectives    Start     Dose/Rate Route Frequency Ordered Stop   07/21/15 0730  ertapenem (INVANZ) 1 g in sodium chloride 0.9 % 50 mL IVPB     1 g 100 mL/hr over 30 Minutes Intravenous On call to O.R. 07/21/15 1610 07/21/15 0920      Medications   Scheduled Meds: . alvimopan  12 mg Oral BID  . insulin aspart  0-15 Units Subcutaneous TID WC  . scopolamine  1 patch Transdermal Once   Continuous Infusions: . dextrose 5% lactated ringers 125 mL/hr at 07/22/15 1012  . fentanyl 2.5 mcg/ml w/ropivacaine 0.2% in normal saline 100 mL EPIDURAL Infusion 8 mL/hr (07/22/15 1228)  . naLOXone (NARCAN) adult infusion for PRURITIS     PRN Meds:.acetaminophen **OR** acetaminophen, diphenhydrAMINE **OR** diphenhydrAMINE, ibuprofen, menthol-cetylpyridinium, morphine injection, nalbuphine **OR** nalbuphine, nalbuphine **OR** nalbuphine, naLOXone (NARCAN) adult infusion for PRURITIS, naloxone  **AND** sodium chloride, ondansetron (ZOFRAN) IV, promethazine, zolpidem   Data Review:   Micro Results Recent Results (from the past 240 hour(s))  Surgical pcr screen     Status: None   Collection Time: 07/17/15  3:18 PM  Result Value Ref Range Status   MRSA, PCR NEGATIVE NEGATIVE Final   Staphylococcus aureus NEGATIVE NEGATIVE Final    Radiology Reports No results found.   CBC  Recent Labs Lab 07/17/15 1518 07/22/15 0431  WBC 4.4 8.4  HGB 15.5 11.3*  HCT 45.1 33.2*  PLT 183 161  MCV 95.2 96.5  MCH 32.8 33.0  MCHC 34.4 34.2  RDW 13.0 12.8    Chemistries   Recent Labs Lab 07/17/15 1518 07/22/15 0431  NA 137 136  K 4.3 3.9  CL 103 106  CO2 28 26  GLUCOSE 196* 279*  BUN 11 14  CREATININE 0.85 0.98  CALCIUM 8.8* 7.6*   ------------------------------------------------------------------------------------------------------------------ estimated creatinine clearance is 71.5 mL/min (by C-G formula based on Cr of 0.98). ------------------------------------------------------------------------------------------------------------------ No results for input(s): HGBA1C in the last 72 hours. ------------------------------------------------------------------------------------------------------------------ No results for input(s): CHOL, HDL, LDLCALC, TRIG, CHOLHDL, LDLDIRECT in the last 72 hours. ------------------------------------------------------------------------------------------------------------------ No results for input(s): TSH, T4TOTAL, T3FREE, THYROIDAB in the last 72 hours.  Invalid input(s): FREET3 ------------------------------------------------------------------------------------------------------------------ No results for input(s): VITAMINB12, FOLATE, FERRITIN, TIBC, IRON, RETICCTPCT in the last 72 hours.  Coagulation profile No results for input(s): INR, PROTIME in the last 168 hours.  No results for input(s): DDIMER in the last 72 hours.  Cardiac  Enzymes No results for input(s): CKMB, TROPONINI, MYOGLOBIN in the last 168 hours.  Invalid input(s): CK ------------------------------------------------------------------------------------------------------------------ Invalid input(s): POCBNP    Assessment & Plan   74 year old Gary Hampton with past medical history of anal cancer, type 2 diabetes on insulin, impaired hearing was admitted for elective surgery.     #1 diabetes insulin requiring. INCREASE SSI DOSE DUE TO INCREASED BG   #2 history of rectal cancer status post adjuvant chemotherapy status post abdominal perineal resection with permanent end colostomy, right hemicolectomy with ileocolostomy management per surgery.   3. DVT prophylaxis subcutaneous Lovenox. Thank you for the consult  will follow while patient is in the hospital.   All th records are reviewed and case discussed with Consulting provider. Management plans discussed with the patient, family and they are in agreement.  CODE STATUS: Full      Code Status Orders        Start     Ordered   07/21/15 1558  Full code   Continuous     07/21/15 1557           Consults  35MIN   DVT Prophylaxis  SCDS  Lab Results  Component Value Date   PLT 161 07/22/2015     Time Spent in minutes   35MIN  Dustin Flock M.D on 07/22/2015 at 2:40 PM  Between 7am to 6pm - Pager - [phone]  After 6pm go to [email or web address] - password [email or web address] [facility]   Office  [phone]

## 2015-07-22 NOTE — Progress Notes (Signed)
Initial Nutrition Assessment    INTERVENTION:   Coordination of Care: await diet progression   NUTRITION DIAGNOSIS:   Inadequate oral intake related to altered GI function as evidenced by  (Liquid Diet).  GOAL:   Patient will meet greater than or equal to 90% of their needs  MONITOR:    (Energy Intake, Digestive System, Anthropometrics, Electrolyte/Renal Profile)  REASON FOR ASSESSMENT:   Malnutrition Screening Tool    ASSESSMENT:    Pt admitted with rectal adenocarcinoma s/p abdominal perineal resection with permanent end colostomy, right hemicolectomy, ileocolstomy  Past Medical History  Diagnosis Date  . Diabetes mellitus without complication   . Colon polyp   . Hard of hearing   . Cancer     Rectal     Diet Order:  Diet clear liquid Room service appropriate?: Yes; Fluid consistency:: Thin   Energy Intake: tolerated CL tray at breakfast this AM  Food and Nutrition Related History:  Unable to complete Nutrition-Focused physical exam at this time.    Electrolyte and Renal Profile:  Recent Labs Lab 07/17/15 1518 07/22/15 0431  BUN 11 14  CREATININE 0.85 0.98  NA 137 136  K 4.3 3.9   Glucose Profile:   Recent Labs  07/21/15 2051 07/22/15 0749 07/22/15 1131  GLUCAP 284* 273* 247*   Meds: D5 at 125 ml/hr, lasix, ss novolog   Digestive System: ostomy, pink, no output  Height:   Ht Readings from Last 1 Encounters:  07/21/15 6' (1.829 m)    Weight: weight trend per weight encounters, does not appear to have weight loss trend  Wt Readings from Last 1 Encounters:  07/21/15 192 lb 4.8 oz (87.227 kg)    Wt Readings from Last 10 Encounters:  07/21/15 192 lb 4.8 oz (87.227 kg)  07/14/15 189 lb (85.73 kg)  06/21/15 184 lb (83.462 kg)  06/12/15 183 lb 6.8 oz (83.2 kg)  06/12/15 182 lb 15.7 oz (83 kg)  05/02/15 179 lb 14.3 oz (81.599 kg)  04/17/15 179 lb 0.2 oz (81.2 kg)  04/03/15 179 lb 10.8 oz (81.5 kg)  03/15/15 185 lb (83.915 kg)   03/09/15 185 lb (83.915 kg)    BMI:  Body mass index is 26.07 kg/(m^2).  Estimated Nutritional Needs:   Kcal:  2330-0762 kcals (BEE 1638, 1.3 AF, 1.1-1.3 IF)   Protein:  104-122 g (1.2-1.4 g.kg)  Fluid:  2175-2610 mL (25-30 ml/kg)   HIGH Care Level  Kerman Passey MS, RD, LDN [phone] Pager

## 2015-07-22 NOTE — Anesthesia Post-op Follow-up Note (Signed)
  Anesthesia Pain Follow-up Note  Patient: Gary Hampton  Day #: 2  Date of Follow-up: 07/22/2015 Time: 5:46 PM  Last Vitals:  Filed Vitals:   07/22/15 1336  BP: 129/52  Pulse: 87  Temp: 36.8 C  Resp: 16    Level of Consciousness: alert  Pain: 4 /10   Side Effects:None  Catheter Site Exam:clean, dry, no drainage  Plan: Continue current therapy  Gerald Leitz

## 2015-07-22 NOTE — Progress Notes (Signed)
Patient ID: Gary Hampton, male   DOB: [birth year], 74 y.o.   MRN: [id number] Only c/o soreness.  AVSS. Abdomen is soft, few bowel sounds heard. Lungs clear.  Incision looks clean. Colostomy intact and viable. Drainage from perineum-scant serosanguinous. Good U/O. Labs ok. Doing well. Encouraged oob with assist today.

## 2015-07-22 NOTE — Progress Notes (Signed)
Spoke with Dr. Jamal Collin about leakage from colostomy bag into the midline incision. Orders given to clean with betadine and re-dress incision with another honeycomb dressing.

## 2015-07-23 LAB — CBC
HEMATOCRIT: 30.6 % — AB (ref 40.0–52.0)
HEMOGLOBIN: 10.5 g/dL — AB (ref 13.0–18.0)
MCH: 32.9 pg (ref 26.0–34.0)
MCHC: 34.2 g/dL (ref 32.0–36.0)
MCV: 96 fL (ref 80.0–100.0)
Platelets: 140 10*3/uL — ABNORMAL LOW (ref 150–440)
RBC: 3.19 MIL/uL — ABNORMAL LOW (ref 4.40–5.90)
RDW: 13.1 % (ref 11.5–14.5)
WBC: 8.4 10*3/uL (ref 3.8–10.6)

## 2015-07-23 LAB — BASIC METABOLIC PANEL
ANION GAP: 4 — AB (ref 5–15)
BUN: 11 mg/dL (ref 6–20)
CALCIUM: 7.7 mg/dL — AB (ref 8.9–10.3)
CO2: 28 mmol/L (ref 22–32)
CREATININE: 0.88 mg/dL (ref 0.61–1.24)
Chloride: 103 mmol/L (ref 101–111)
GFR calc non Af Amer: >60 mL/min (ref >60)
Glucose, Bld: 246 mg/dL — ABNORMAL HIGH (ref 65–99)
Potassium: 3.8 mmol/L (ref 3.5–5.1)
SODIUM: 135 mmol/L (ref 135–145)

## 2015-07-23 LAB — GLUCOSE, CAPILLARY
GLUCOSE-CAPILLARY: 255 mg/dL — AB (ref 65–99)
GLUCOSE-CAPILLARY: 213 mg/dL — AB (ref 65–99)
Glucose-Capillary: 250 mg/dL — ABNORMAL HIGH (ref 65–99)
Glucose-Capillary: 194 mg/dL — ABNORMAL HIGH (ref 65–99)

## 2015-07-23 MED ORDER — PROMETHAZINE HCL 25 MG/ML IJ SOLN
12.5000 mg | INTRAMUSCULAR | Status: DC | PRN
  Administered 2015-07-23 – 2015-07-24 (×4): 12.5 mg via INTRAVENOUS
  Filled 2015-07-23 (×4): qty 1

## 2015-07-23 MED ORDER — INSULIN GLARGINE 100 UNIT/ML ~~LOC~~ SOLN
6.0000 [IU] | Freq: Every day | SUBCUTANEOUS | Status: DC
  Administered 2015-07-23 – 2015-07-24 (×2): 6 [IU] via SUBCUTANEOUS
  Filled 2015-07-23 (×4): qty 0.06

## 2015-07-23 MED ORDER — OXYCODONE HCL 5 MG PO TABS
5.0000 mg | ORAL_TABLET | ORAL | Status: DC | PRN
  Administered 2015-07-23: 5 mg via ORAL
  Filled 2015-07-23: qty 1

## 2015-07-23 MED ORDER — ZOLPIDEM TARTRATE 5 MG PO TABS
10.0000 mg | ORAL_TABLET | Freq: Every day | ORAL | Status: DC
  Administered 2015-07-23: 10 mg via ORAL
  Filled 2015-07-23: qty 2

## 2015-07-23 MED ORDER — ENOXAPARIN SODIUM 40 MG/0.4ML ~~LOC~~ SOLN
40.0000 mg | SUBCUTANEOUS | Status: DC
  Administered 2015-07-23 – 2015-07-26 (×4): 40 mg via SUBCUTANEOUS
  Filled 2015-07-23 (×4): qty 0.4

## 2015-07-23 MED ORDER — INSULIN GLARGINE 100 UNIT/ML SOLOSTAR PEN: 20.0000 [IU] | PEN_INJECTOR | Freq: Every day | SUBCUTANEOUS | Status: DC

## 2015-07-23 MED ORDER — DIPHENHYDRAMINE HCL 25 MG PO CAPS: 25.0000 mg | ORAL_CAPSULE | Freq: Once | ORAL | Status: DC

## 2015-07-23 NOTE — Progress Notes (Signed)
Spoke with Corene Cornea in Pharmacy about new order for phenergan. Stated I could give dose of 12.5 now.

## 2015-07-23 NOTE — Progress Notes (Addendum)
Epidural was removed per order. Catheter was intact. Pt tolerated well.

## 2015-07-23 NOTE — Progress Notes (Signed)
Spoke with Dr. Jamal Collin regarding elevated BP. No new orders given.

## 2015-07-23 NOTE — Progress Notes (Signed)
Pt is still feeling very nauseous. Encourage pt to sit on side of bed for a few minutes. Pt states, "I feel bad." PRN zofran and phenergan have been given with very little relief.

## 2015-07-23 NOTE — Progress Notes (Signed)
Patient ID: Gary Hampton, male   DOB: 1941-05-20, 74 y.o.   MRN: [id number] C/o nausea. Thinks epidural may be causing it. Had some numbness in right leg when he got oob last pm.Better after epidural dose was reduced. AVSS. Abdomen is soft, good bowel sounds. Incision clean. Colostomy, intact, pink, scant green liquid output. Drain- 358ml serosang drainage. Labs stable. Good u/o. Plan-D/C epidural. Foley to remain till tomorrow. Can advance diet once nausea resolves.

## 2015-07-23 NOTE — Progress Notes (Signed)
[place] at [facility]                                                                                                                                                                                            Patient Demographics   Gary Hampton, is a 74 y.o. male, DOB - 28-Oct-1941, [id number]  Admit date - 07/21/2015   Admitting Physician Robert Bellow, MD  Outpatient Primary MD for the patient is [name],[name], MD   LOS - 2  Subjective: Patient states that he could just does not sleep last night had abdominal pain, one something to help him sleep     Review of Systems:   CONSTITUTIONAL: No documented fever. No fatigue, weakness. No weight gain, no weight loss.  EYES: No blurry or double vision.  ENT: No tinnitus. No postnasal drip. No redness of the oropharynx.  RESPIRATORY: No cough, no wheeze, no hemoptysis. No dyspnea.  CARDIOVASCULAR: No chest pain. No orthopnea. No palpitations. No syncope.  GASTROINTESTINAL: No nausea, no vomiting or diarrhea. Positive abdominal pain. No melena or hematochezia.  GENITOURINARY: No dysuria or hematuria.  ENDOCRINE: No polyuria or nocturia. No heat or cold intolerance.  HEMATOLOGY: No anemia. No bruising. No bleeding.  INTEGUMENTARY: No rashes. No lesions.  MUSCULOSKELETAL: No arthritis. No swelling. No gout.  NEUROLOGIC: No numbness, tingling, or ataxia. No seizure-type activity.  PSYCHIATRIC: No anxiety. No insomnia. No ADD.    Vitals:   Filed Vitals:   07/22/15 1336 07/22/15 2102 07/23/15 0409 07/23/15 0453  BP: 129/52 138/59  141/55  Pulse: 87 84  84  Temp: 98.3 F (36.8 C) 97.7 F (36.5 C)  98.1 F (36.7 C)  TempSrc: Oral Oral  Oral  Resp: 16 18 20 18   Height:      Weight:      SpO2: 100% 99%  97%    Wt Readings from Last 3 Encounters:  07/21/15 87.227 kg (192 lb 4.8 oz)  07/14/15 85.73 kg (189 lb)  06/21/15 83.462 kg (184 lb)     Intake/Output Summary (Last 24 hours) at  07/23/15 1124 Last data filed at 07/23/15 1120  Gross per 24 hour  Intake 3896.28 ml  Output   1430 ml  Net 2466.28 ml    Physical Exam:   GENERAL: Pleasant-appearing in no apparent distress.  HEAD, EYES, EARS, NOSE AND THROAT: Atraumatic, normocephalic. Extraocular muscles are intact. Pupils equal and reactive to light. Sclerae anicteric. No conjunctival injection. No oro-pharyngeal erythema.  NECK: Supple. There is no jugular venous distention. No bruits, no lymphadenopathy, no thyromegaly.  HEART: Regular rate and rhythm,. No  murmurs, no rubs, no clicks.  LUNGS: Clear to auscultation bilaterally. No rales or rhonchi. No wheezes.  ABDOMEN: Soft, flat, nontender, nondistended.  DECREASED BS EXTREMITIES: No evidence of any cyanosis, clubbing, or peripheral edema.  +2 pedal and radial pulses bilaterally.  NEUROLOGIC: The patient is alert, awake, and oriented x3 with no focal motor or sensory deficits appreciated bilaterally.  SKIN: Moist and warm with no rashes appreciated.  Psych: Not anxious, depressed LN: No inguinal LN enlargement    Antibiotics   Anti-infectives    Start     Dose/Rate Route Frequency Ordered Stop   07/21/15 0730  ertapenem (INVANZ) 1 g in sodium chloride 0.9 % 50 mL IVPB     1 g 100 mL/hr over 30 Minutes Intravenous On call to O.R. 07/21/15 6578 07/21/15 0920      Medications   Scheduled Meds: . alvimopan  12 mg Oral BID  . diphenhydrAMINE  25 mg Oral Once  . insulin aspart  0-15 Units Subcutaneous TID WC  . Insulin Glargine  6 Units Subcutaneous Q lunch  . scopolamine  1 patch Transdermal Once  . zolpidem  10 mg Oral QHS   Continuous Infusions: . dextrose 5% lactated ringers 125 mL/hr at 07/23/15 0422  . fentanyl 2.5 mcg/ml w/ropivacaine 0.2% in normal saline 100 mL EPIDURAL Infusion 6 mL/hr (07/23/15 0409)  . naLOXone (NARCAN) adult infusion for PRURITIS     PRN Meds:.acetaminophen **OR** acetaminophen, diphenhydrAMINE **OR** diphenhydrAMINE,  ibuprofen, menthol-cetylpyridinium, morphine injection, nalbuphine **OR** nalbuphine, nalbuphine **OR** nalbuphine, naLOXone (NARCAN) adult infusion for PRURITIS, naloxone **AND** sodium chloride, ondansetron (ZOFRAN) IV, oxyCODONE, promethazine   Data Review:   Micro Results Recent Results (from the past 240 hour(s))  Surgical pcr screen     Status: None   Collection Time: 07/17/15  3:18 PM  Result Value Ref Range Status   MRSA, PCR NEGATIVE NEGATIVE Final   Staphylococcus aureus NEGATIVE NEGATIVE Final    Radiology Reports No results found.   CBC  Recent Labs Lab 07/17/15 1518 07/22/15 0431 07/23/15 0536  WBC 4.4 8.4 8.4  HGB 15.5 11.3* 10.5*  HCT 45.1 33.2* 30.6*  PLT 183 161 140*  MCV 95.2 96.5 96.0  MCH 32.8 33.0 32.9  MCHC 34.4 34.2 34.2  RDW 13.0 12.8 13.1    Chemistries   Recent Labs Lab 07/17/15 1518 07/22/15 0431 07/23/15 0536  NA 137 136 135  K 4.3 3.9 3.8  CL 103 106 103  CO2 28 26 28   GLUCOSE 196* 279* 246*  BUN 11 14 11   CREATININE 0.85 0.98 0.88  CALCIUM 8.8* 7.6* 7.7*   ------------------------------------------------------------------------------------------------------------------ estimated creatinine clearance is 79.6 mL/min (by C-G formula based on Cr of 0.88). ------------------------------------------------------------------------------------------------------------------ No results for input(s): HGBA1C in the last 72 hours. ------------------------------------------------------------------------------------------------------------------ No results for input(s): CHOL, HDL, LDLCALC, TRIG, CHOLHDL, LDLDIRECT in the last 72 hours. ------------------------------------------------------------------------------------------------------------------ No results for input(s): TSH, T4TOTAL, T3FREE, THYROIDAB in the last 72 hours.  Invalid input(s):  FREET3 ------------------------------------------------------------------------------------------------------------------ No results for input(s): VITAMINB12, FOLATE, FERRITIN, TIBC, IRON, RETICCTPCT in the last 72 hours.  Coagulation profile No results for input(s): INR, PROTIME in the last 168 hours.  No results for input(s): DDIMER in the last 72 hours.  Cardiac Enzymes No results for input(s): CKMB, TROPONINI, MYOGLOBIN in the last 168 hours.  Invalid input(s): CK ------------------------------------------------------------------------------------------------------------------ Invalid input(s): POCBNP    Assessment & Plan   74 year old Mr. Siebert with past medical history of anal cancer, type 2 diabetes on insulin, impaired hearing was admitted for  elective surgery.     #1 diabetes insulin requiring. Glucose elevated I'll restart his Lantus low-dose   #2 history of rectal cancer status post adjuvant chemotherapy status post abdominal perineal resection with permanent end colostomy, right hemicolectomy with ileocolostomy management per surgery.   3. DVT prophylaxis subcutaneous Lovenox.  4. Insomnia give Benadryl 1 and uses Ambien at nighttime   All th records are reviewed and case discussed with Consulting provider. Management plans discussed with the patient, family and they are in agreement.  CODE STATUS: Full      Code Status Orders        Start     Ordered   07/21/15 1558  Full code   Continuous     07/21/15 1557           Consults  35MIN   DVT Prophylaxis  SCDS  Lab Results  Component Value Date   PLT 140* 07/23/2015     Time Spent in minutes   35MIN  Dustin Flock M.D on 07/23/2015 at 11:24 AM  Between 7am to 6pm - Pager - [phone]  After 6pm go to [email or web address] - password [email or web address] [facility]   Office  [phone]

## 2015-07-23 NOTE — Progress Notes (Signed)
Spoke with Dr. Jamal Collin again regarding the pt not being able to tolerate clear liquids and being nauseated. Dr. Jamal Collin increased phenergan dose to 12.5. Suggested PPI to Dr. Jamal Collin also. I also informed the Dr about the decrease in colostomy output from yesterday. Will continue to monitor.

## 2015-07-24 ENCOUNTER — Inpatient Hospital Stay: Payer: Medicare Other | Attending: Internal Medicine

## 2015-07-24 ENCOUNTER — Inpatient Hospital Stay: Payer: Medicare Other | Admitting: Internal Medicine

## 2015-07-24 ENCOUNTER — Inpatient Hospital Stay: Payer: Medicare Other

## 2015-07-24 LAB — GLUCOSE, CAPILLARY
GLUCOSE-CAPILLARY: 273 mg/dL — AB (ref 65–99)
GLUCOSE-CAPILLARY: 153 mg/dL — AB (ref 65–99)
Glucose-Capillary: 268 mg/dL — ABNORMAL HIGH (ref 65–99)
Glucose-Capillary: 154 mg/dL — ABNORMAL HIGH (ref 65–99)

## 2015-07-24 MED ORDER — HYDRALAZINE HCL 20 MG/ML IJ SOLN
10.0000 mg | Freq: Four times a day (QID) | INTRAMUSCULAR | Status: DC | PRN
  Filled 2015-07-24: qty 1

## 2015-07-24 MED ORDER — KCL IN DEXTROSE-NACL 20-5-0.2 MEQ/L-%-% IV SOLN
INTRAVENOUS | Status: DC
  Filled 2015-07-24: qty 1000

## 2015-07-24 MED ORDER — ZOLPIDEM TARTRATE 5 MG PO TABS
5.0000 mg | ORAL_TABLET | Freq: Every day | ORAL | Status: DC
Start: 2015-07-24 — End: 2015-07-27
  Administered 2015-07-24 – 2015-07-25 (×2): 5 mg via ORAL
  Filled 2015-07-24 (×3): qty 1

## 2015-07-24 MED ORDER — ASPIRIN 81 MG PO CHEW
CHEWABLE_TABLET | ORAL | Status: AC
  Administered 2015-07-24: 324 mg via ORAL
  Filled 2015-07-24: qty 4

## 2015-07-24 MED ORDER — SODIUM CHLORIDE 0.9 % IV SOLN
INTRAVENOUS | Status: DC
  Administered 2015-07-24: 14:00:00 via INTRAVENOUS

## 2015-07-24 MED ORDER — ASPIRIN 81 MG PO CHEW
324.0000 mg | CHEWABLE_TABLET | Freq: Once | ORAL | Status: AC
  Administered 2015-07-24 (×2): 324 mg via ORAL

## 2015-07-24 MED ORDER — FUROSEMIDE 10 MG/ML IJ SOLN
20.0000 mg | Freq: Two times a day (BID) | INTRAMUSCULAR | Status: AC
  Administered 2015-07-24 (×2): 20 mg via INTRAVENOUS
  Filled 2015-07-24 (×2): qty 2

## 2015-07-24 NOTE — Progress Notes (Signed)
Notified Gary Hampton of pt c/o chest pain (dull in nature 7/10 radiating across chest bilaterally) , received orders to follow up with on-call hospitalist. Dr hower notified, orders for asa and ekg placed, okay to give prn dose of morphine for pain along with prn hydralazine for BP elevation. Will continue to monitor.

## 2015-07-24 NOTE — Progress Notes (Signed)
Inpatient Diabetes Program Recommendations  AACE/ADA: New Consensus Statement on Inpatient Glycemic Control (2015)  Target Ranges:  Prepandial:   less than 140 mg/dL      Peak postprandial:   less than 180 mg/dL (1-2 hours)      Critically ill patients:  140 - 180 mg/dL   Review of Glycemic Control:  Results for DESTYN, SCHUYLER (MRN [id number]) as of 07/24/2015 14:58  Ref. Range 07/23/2015 11:42 07/23/2015 16:33 07/23/2015 21:56 07/24/2015 07:40 07/24/2015 11:23  Glucose-Capillary Latest Ref Range: 65-99 mg/dL 255 (H) 194 (H) 213 (H) 268 (H) 273 (H)   Diabetes history: Diabetes Mellitus Outpatient Diabetes medications: Lantus 20 units daily with lunch Current orders for Inpatient glycemic control:  Lantus 6 units daily, Novolog moderate tid with meals  Inpatient Diabetes Program Recommendations:     Note that Blood glucose greater than goal.  Please consider increasing Lantus to 16 units daily.    Thanks, Adah Perl, RN, BC-ADM Inpatient Diabetes Coordinator Pager [phone] (8a-5p)

## 2015-07-24 NOTE — Consult Note (Signed)
WOC ostomy consult note Stoma type/location: LUQ end colostomy Stomal assessment/size: Not assessed today.  Patient is nauseated and states that his pouch was just changed.  Is not leaking at this time and I provided emotional support and informed him we would perform a pouch change tomorrow.  He has questions today regarding 2 piece vs 1 piece pouches and we discussed the options.  We will begin with a 2 piece tomorrow.  A barrier ring will be implemented due to close proximity of surgical incision. Discussed emptying and showering today.  Patient is weak but asks questions and states tomorrow he will be ready to begin.   Peristomal assessment: Not assessed.  Treatment options for stomal/peristomal skin: Barrier ring due to midline staple line.  Penrose drain in place.  Output Liquid green stool today. Ostomy pouching: 2pc. pouch  Education provided: see above Enrolled patient in [place] program:No

## 2015-07-24 NOTE — Progress Notes (Signed)
Afebrile. Marked increase in blood pressure last night, better this morning. Patient reported some chest discomfort overnight, minimal this morning.  Patient is +6 L post surgery, increased blood pressure may be secondary to volumes of fluid required firm epidural.  Right lower extremity weakness previously reported by the patient has resolved.  Lungs: Clear. Incentive spirometer at 1200.  Cardiac: Regular rhythm without murmur or gallop.  Abdomen: Nondistended, slight tympany and epigastrium, soft.  Wound: Clean. Some leakage from her stoma underneath the honeycomb dressing.  Perineum: Clean. Serosanguineous drainage persist.  Extremities: Soft.  Labs: Hemoglobin down 0.8, normal white blood cell count. Platelet count has drifted down to 140,000.  Impression: Doing well status post combination right hemicolectomy and abdominal perineal resection. Fluid she has related to epidural.  The patient has no previous history of hypertension. We'll treat with Lasix 20 mg IV every 12 hours 2. Will follow-up potassium tomorrow. Decrease IV fluid volume. Recheck CBC with platelet count tomorrow.  Physical therapy to assist with ambulation. We'll request the stoma nurse begin instruction.

## 2015-07-24 NOTE — Anesthesia Postprocedure Evaluation (Signed)
  Anesthesia Post-op Note  Patient: Gary Hampton  Procedure(s) Performed: Procedure(s): Removal of the rectum, anus, and right colon; formation of permanent colostomy (N/A)  Anesthesia type:General, Epidural  Patient location: PACU  Post pain: Pain level controlled  Post assessment: Post-op Vital signs reviewed, Patient's Cardiovascular Status Stable, Respiratory Function Stable, Patent Airway and No signs of Nausea or vomiting  Post vital signs: Reviewed and stable  Last Vitals:  Filed Vitals:   07/24/15 0757  BP: 168/84  Pulse:   Temp:   Resp:     Level of consciousness: awake, alert  and patient cooperative  Complications: No apparent anesthesia complications  Rounded on patient this morning.  Pt was sleeping when I arrived.  Epidural was D/C'd on 9/12/02/14 approx 11 am.  Pt stated pain was tolerable at this time.

## 2015-07-24 NOTE — Care Management Important Message (Signed)
Important Message  Patient Details  Name: Gary Hampton MRN: [id number] Date of Birth: 1941-01-07   Medicare Important Message Given:  Yes-second notification given    [name] 07/24/2015, 11:09 AM

## 2015-07-24 NOTE — Progress Notes (Signed)
[place] at [facility]                                                                                                                                                                                            Patient Demographics   Gary Hampton, is a 74 y.o. male, DOB - September 14, 1941, NKN:[id number]  Admit date - 07/21/2015   Admitting Physician Robert Bellow, MD  Outpatient Primary MD for the patient is [name],[name], MD   LOS - 3  Subjective: Patient's blood pressure elevated yesterday. He was also having some chest discomfort which is currently resolved. His blood sugars continue to be elevated but he is started on D5 containing fluid.    Review of Systems:   CONSTITUTIONAL: No documented fever. No fatigue, weakness. No weight gain, no weight loss.  EYES: No blurry or double vision.  ENT: No tinnitus. No postnasal drip. No redness of the oropharynx.  RESPIRATORY: No cough, no wheeze, no hemoptysis. No dyspnea.  CARDIOVASCULAR: Overnight chest pain. No orthopnea. No palpitations. No syncope.  GASTROINTESTINAL: No nausea, no vomiting or diarrhea. Positive abdominal pain. No melena or hematochezia.  GENITOURINARY: No dysuria or hematuria.  ENDOCRINE: No polyuria or nocturia. No heat or cold intolerance.  HEMATOLOGY: No anemia. No bruising. No bleeding.  INTEGUMENTARY: No rashes. No lesions.  MUSCULOSKELETAL: No arthritis. No swelling. No gout.  NEUROLOGIC: No numbness, tingling, or ataxia. No seizure-type activity.  PSYCHIATRIC: No anxiety. No insomnia. No ADD.    Vitals:   Filed Vitals:   07/23/15 2153 07/23/15 2229 07/24/15 0607 07/24/15 0757  BP: 205/87 170/80 184/84 168/84  Pulse: 96  98   Temp: 98.1 F (36.7 C)  98.1 F (36.7 C)   TempSrc: Oral  Oral   Resp: 20  18   Height:      Weight:      SpO2: 97%  97%     Wt Readings from Last 3 Encounters:  07/21/15 87.227 kg (192 lb 4.8 oz)  07/14/15 85.73 kg (189 lb)  06/21/15  83.462 kg (184 lb)     Intake/Output Summary (Last 24 hours) at 07/24/15 1122 Last data filed at 07/24/15 0738  Gross per 24 hour  Intake   2515 ml  Output   3375 ml  Net   -860 ml    Physical Exam:   GENERAL: Pleasant-appearing in no apparent distress.  HEAD, EYES, EARS, NOSE AND THROAT: Atraumatic, normocephalic. Extraocular muscles are intact. Pupils equal and reactive to light. Sclerae anicteric. No conjunctival injection. No oro-pharyngeal erythema.  NECK: Supple. There is no jugular venous distention. No bruits,  no lymphadenopathy, no thyromegaly.  HEART: Regular rate and rhythm,. No murmurs, no rubs, no clicks.  LUNGS: Clear to auscultation bilaterally. No rales or rhonchi. No wheezes.  ABDOMEN: Soft, flat, nontender, nondistended.  DECREASED BS EXTREMITIES: No evidence of any cyanosis, clubbing, or peripheral edema.  +2 pedal and radial pulses bilaterally.  NEUROLOGIC: The patient is alert, awake, and oriented x3 with no focal motor or sensory deficits appreciated bilaterally.  SKIN: Moist and warm with no rashes appreciated.  Psych: Not anxious, depressed LN: No inguinal LN enlargement    Antibiotics   Anti-infectives    Start     Dose/Rate Route Frequency Ordered Stop   07/21/15 0730  ertapenem (INVANZ) 1 g in sodium chloride 0.9 % 50 mL IVPB     1 g 100 mL/hr over 30 Minutes Intravenous On call to O.R. 07/21/15 6659 07/21/15 0920      Medications   Scheduled Meds: . alvimopan  12 mg Oral BID  . enoxaparin (LOVENOX) injection  40 mg Subcutaneous Q24H  . furosemide  20 mg Intravenous Q12H  . insulin aspart  0-15 Units Subcutaneous TID WC  . insulin glargine  6 Units Subcutaneous Q lunch  . zolpidem  5 mg Oral QHS   Continuous Infusions: . sodium chloride    . naLOXone (NARCAN) adult infusion for PRURITIS     PRN Meds:.acetaminophen **OR** [DISCONTINUED] acetaminophen, hydrALAZINE, ibuprofen, menthol-cetylpyridinium, morphine injection, nalbuphine **OR**  nalbuphine, nalbuphine **OR** nalbuphine, naLOXone (NARCAN) adult infusion for PRURITIS, naloxone **AND** sodium chloride, ondansetron (ZOFRAN) IV, oxyCODONE, promethazine   Data Review:   Micro Results Recent Results (from the past 240 hour(s))  Surgical pcr screen     Status: None   Collection Time: 07/17/15  3:18 PM  Result Value Ref Range Status   MRSA, PCR NEGATIVE NEGATIVE Final   Staphylococcus aureus NEGATIVE NEGATIVE Final    Radiology Reports No results found.   CBC  Recent Labs Lab 07/17/15 1518 07/22/15 0431 07/23/15 0536  WBC 4.4 8.4 8.4  HGB 15.5 11.3* 10.5*  HCT 45.1 33.2* 30.6*  PLT 183 161 140*  MCV 95.2 96.5 96.0  MCH 32.8 33.0 32.9  MCHC 34.4 34.2 34.2  RDW 13.0 12.8 13.1    Chemistries   Recent Labs Lab 07/17/15 1518 07/22/15 0431 07/23/15 0536  NA 137 136 135  K 4.3 3.9 3.8  CL 103 106 103  CO2 28 26 28   GLUCOSE 196* 279* 246*  BUN 11 14 11   CREATININE 0.85 0.98 0.88  CALCIUM 8.8* 7.6* 7.7*   ------------------------------------------------------------------------------------------------------------------ estimated creatinine clearance is 79.6 mL/min (by C-G formula based on Cr of 0.88). ------------------------------------------------------------------------------------------------------------------ No results for input(s): HGBA1C in the last 72 hours. ------------------------------------------------------------------------------------------------------------------ No results for input(s): CHOL, HDL, LDLCALC, TRIG, CHOLHDL, LDLDIRECT in the last 72 hours. ------------------------------------------------------------------------------------------------------------------ No results for input(s): TSH, T4TOTAL, T3FREE, THYROIDAB in the last 72 hours.  Invalid input(s): FREET3 ------------------------------------------------------------------------------------------------------------------ No results for input(s): VITAMINB12, FOLATE,  FERRITIN, TIBC, IRON, RETICCTPCT in the last 72 hours.  Coagulation profile No results for input(s): INR, PROTIME in the last 168 hours.  No results for input(s): DDIMER in the last 72 hours.  Cardiac Enzymes No results for input(s): CKMB, TROPONINI, MYOGLOBIN in the last 168 hours.  Invalid input(s): CK ------------------------------------------------------------------------------------------------------------------ Invalid input(s): POCBNP    Assessment & Plan   74 year old Mr. Mcniel with past medical history of anal cancer, type 2 diabetes on insulin, impaired hearing was admitted for elective surgery.     #1 diabetes insulin requiring. Continue  Lantus and sliding scale insulin, change D5 containing fluid to normal saline   #2 history of rectal cancer status post adjuvant chemotherapy status post abdominal perineal resection with permanent end colostomy, right hemicolectomy with ileocolostomy management per surgery.   3. DVT prophylaxis subcutaneous Lovenox.  4. Insomnia Ambien at nighttime  5. Accelerated blood pressure patient started on Lasix per surgery I will add hydralazine when necessary   All th records are reviewed and case discussed with Consulting provider. Management plans discussed with the patient, family and they are in agreement.  CODE STATUS: Full      Code Status Orders        Start     Ordered   07/21/15 1558  Full code   Continuous     07/21/15 1557              DVT Prophylaxis  SCDS  Lab Results  Component Value Date   PLT 140* 07/23/2015     Time Spent in minutes   Weston Anna M.D on 07/24/2015 at 11:22 AM  Between 7am to 6pm - Pager - [phone]  After 6pm go to [email or web address] - password [email or web address] [facility]   Office  [phone]

## 2015-07-25 LAB — CBC WITH DIFFERENTIAL/PLATELET
Basophils Absolute: 0 10*3/uL (ref 0–0.1)
Basophils Relative: 0 %
EOS PCT: 2 %
Eosinophils Absolute: 0.3 10*3/uL (ref 0–0.7)
HEMATOCRIT: 32.7 % — AB (ref 40.0–52.0)
Hemoglobin: 11.5 g/dL — ABNORMAL LOW (ref 13.0–18.0)
LYMPHS ABS: 0.7 10*3/uL — AB (ref 1.0–3.6)
LYMPHS PCT: 5 %
MCH: 33.2 pg (ref 26.0–34.0)
MCHC: 35.3 g/dL (ref 32.0–36.0)
MCV: 94 fL (ref 80.0–100.0)
MONO ABS: 0.9 10*3/uL (ref 0.2–1.0)
MONOS PCT: 7 %
Neutro Abs: 11.7 10*3/uL — ABNORMAL HIGH (ref 1.4–6.5)
Neutrophils Relative %: 86 %
PLATELETS: 202 10*3/uL (ref 150–440)
RBC: 3.48 MIL/uL — ABNORMAL LOW (ref 4.40–5.90)
RDW: 12.6 % (ref 11.5–14.5)
WBC: 13.6 10*3/uL — ABNORMAL HIGH (ref 3.8–10.6)

## 2015-07-25 LAB — BASIC METABOLIC PANEL
Anion gap: 10 (ref 5–15)
BUN: 14 mg/dL (ref 6–20)
CALCIUM: 7.5 mg/dL — AB (ref 8.9–10.3)
CO2: 28 mmol/L (ref 22–32)
Chloride: 97 mmol/L — ABNORMAL LOW (ref 101–111)
Creatinine, Ser: 0.79 mg/dL (ref 0.61–1.24)
GFR calc Af Amer: >60 mL/min (ref >60)
GLUCOSE: 175 mg/dL — AB (ref 65–99)
Potassium: 3.1 mmol/L — ABNORMAL LOW (ref 3.5–5.1)
Sodium: 135 mmol/L (ref 135–145)

## 2015-07-25 LAB — GLUCOSE, CAPILLARY
GLUCOSE-CAPILLARY: 225 mg/dL — AB (ref 65–99)
Glucose-Capillary: 166 mg/dL — ABNORMAL HIGH (ref 65–99)
Glucose-Capillary: 170 mg/dL — ABNORMAL HIGH (ref 65–99)
Glucose-Capillary: 195 mg/dL — ABNORMAL HIGH (ref 65–99)

## 2015-07-25 LAB — SURGICAL PATHOLOGY

## 2015-07-25 MED ORDER — POTASSIUM CHLORIDE 10 MEQ/100ML IV SOLN
10.0000 meq | INTRAVENOUS | Status: AC
  Filled 2015-07-25 (×2): qty 100

## 2015-07-25 MED ORDER — MELOXICAM 7.5 MG PO TABS
7.5000 mg | ORAL_TABLET | Freq: Every day | ORAL | Status: DC
  Administered 2015-07-25 – 2015-07-27 (×3): 7.5 mg via ORAL
  Filled 2015-07-25 (×3): qty 1

## 2015-07-25 MED ORDER — INSULIN GLARGINE 100 UNIT/ML ~~LOC~~ SOLN
10.0000 [IU] | Freq: Every day | SUBCUTANEOUS | Status: DC
  Administered 2015-07-25 – 2015-07-26 (×2): 10 [IU] via SUBCUTANEOUS
  Filled 2015-07-25 (×5): qty 0.1

## 2015-07-25 MED ORDER — POTASSIUM CHLORIDE 20 MEQ PO PACK
20.0000 meq | PACK | Freq: Two times a day (BID) | ORAL | Status: DC
  Administered 2015-07-25 – 2015-07-26 (×3): 20 meq via ORAL
  Filled 2015-07-25 (×5): qty 1

## 2015-07-25 NOTE — Care Management (Signed)
Spoke with patient for discharge planning. Pt is from home alone and has no support system. 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  New ostomy and pateint stated that he is having considerable pain when up to chair. Patient was evaluated by physical therapy and their recommendation at this time is for Rehab. Informed CSW Baxter Flattery of PT recommendations. Anticipate discharge soon to SNF/STR.

## 2015-07-25 NOTE — Clinical Social Work Note (Signed)
Clinical Social Work Assessment  Patient Details  Name: Gary Hampton MRN: [id number] Date of Birth: April 24, 1941  Date of referral:  07/25/15               Reason for consult:  Facility Placement                Permission sought to share information with:    Permission granted to share information::     Name::        Agency::     Relationship::     Contact Information:     Housing/Transportation Living arrangements for the past 2 months:  Single Family Home Source of Information:  Patient Patient Interpreter Needed:  None Criminal Activity/Legal Involvement Pertinent to Current Situation/Hospitalization:  No - Comment as needed Significant Relationships:  Friend Lives with:    Do you feel safe going back to the place where you live?  Yes Need for family participation in patient care:  No (Coment)  Care giving concerns:  Patient lives alone   Facilities manager / plan:  CSW informed by PT that patient will need STR. CSW spoke with patient this afternoon who reports he lives alone and that he is in agreement with going to a facility for STR. Patient states he has a friend that could transport him when time. Patient is hopeful he can recover and return home as soon as possible.   Employment status:  Retired Forensic scientist:  Medicare PT Recommendations:  [place] / Referral to community resources:  [place]  Patient/Family's Response to care:  Patient expressed appreciation for CSW visit.  Patient/Family's Understanding of and Emotional Response to Diagnosis, Current Treatment, and Prognosis:  Patient is understanding of his current situation and that he needs a little more help than what he can do for himself in the home currently.  Emotional Assessment Appearance:  Appears stated age Attitude/Demeanor/Rapport:   (pleasant and cooperative) Affect (typically observed):  Accepting, Appropriate Orientation:  Oriented to  Self, Oriented to Place, Oriented to  Time, Oriented to Situation Alcohol / Substance use:  Not Applicable Psych involvement (Current and /or in the community):  No (Comment)  Discharge Needs  Concerns to be addressed:  Care Coordination Readmission within the last 30 days:  No Current discharge risk:  None Barriers to Discharge:  No Barriers Identified   Shela Leff, LCSW 07/25/2015, 4:24 PM

## 2015-07-25 NOTE — Evaluation (Signed)
Physical Therapy Evaluation Patient Details Name: Gary Hampton MRN: [id number] DOB: 1941/05/06 Today's Date: 07/25/2015   History of Present Illness  74 year old Gary Hampton with past medical history of anal cancer, type 2 diabetes on insulin, impaired hearing was admitted for elective surgery. Pt s/p perineal resection w/ R hemi colectomy and colostomy.   Clinical Impression  Upon evaluation, pt displayed a good capacity for functional mobility. Pt was able to display modified independence with bed mobility (supine<>seated on EOB) , only requiring bed rails for assistance with transfer. Pt was able to demonstrate sit<>stand transfer with close PT supervision and BUE support (one hand on walker and one hand pushing through the bed). Pt ambulated ~ 100 feet with RW and CGA +1. Pt limited by fatigue, stating that his legs were starting to feel weak,  and increased soreness at his anterior surgical site. Attempted to reposition pt in chair at the end of the session but pt was unable to maintain seated position due to pain/discomfort at posterior surgical site. Pt will continue to benefit from acute skilled PT services in order to increase capacity for functional mobility. Current d/c recommendations include SNF in order for pt to continue to receive close medical attention to his multiple surgical sites and colostomy bag. Concerns about pt returning home and being able to care for himself as far as ADLs and maintenance of his surgical sites independently.     Follow Up Recommendations SNF    Equipment Recommendations       Recommendations for Other Services       Precautions / Restrictions Precautions Precautions: None Restrictions Weight Bearing Restrictions: No      Mobility  Bed Mobility Overal bed mobility: Modified Independent             General bed mobility comments: used bed rail for supine <>seated on EOB transfer  Transfers Overall transfer level: Needs  assistance Equipment used: Rolling walker (2 wheeled)             General transfer comment: Pt able to complete sit<>stand transfer with modified independence; PT provided close supervision.   Ambulation/Gait Ambulation/Gait assistance: Min guard Ambulation Distance (Feet): 100 Feet Assistive device: Rolling walker (2 wheeled) Gait Pattern/deviations: WFL(Within Functional Limits)   Gait velocity interpretation: <1.8 ft/sec, indicative of risk for recurrent falls General Gait Details: pt displayed gait with normal step/stride lengths; decreased gait velocity and endurance noted  Stairs            Wheelchair Mobility    Modified Rankin (Stroke Patients Only)       Balance Overall balance assessment: Needs assistance Sitting-balance support: No upper extremity supported;Feet supported Sitting balance-Leahy Scale: Good       Standing balance-Leahy Scale: Fair Standing balance comment: pt states that he has issues with balance at baseline which is why he uses a quad cane for community ambulation                             Pertinent Vitals/Pain Pain Assessment: 0-10 Pain Location: Pt states that his surgical sites are a little bit sore today Pain Intervention(s): Limited activity within patient's tolerance;Monitored during session;Repositioned (attempted to have pt sit up in chair at the end of the session, pt was unable to maintain seated position due to discomfort of surgical site on bottocks)    Home Living Family/patient expects to be discharged to:: Private residence Living Arrangements: Alone   Type of Home: Mobile  home Home Access: Stairs to enter   Entrance Stairs-Number of Steps: 2; with handle at top he can reach but no railing Home Layout: One level Home Equipment: Cane - quad Additional Comments: Used quad cane when out in community due to feelings of imbalance. Pt denies using quad cane in his home.     Prior Function Level of  Independence: Independent with assistive device(s)               Hand Dominance        Extremity/Trunk Assessment   Upper Extremity Assessment: Overall WFL for tasks assessed (strength at least 3+/5 assessed via AROM against gravity)           Lower Extremity Assessment: Overall WFL for tasks assessed (pt demonstrated great functional strength of LEs; at least 4-/5 bialt)      Cervical / Trunk Assessment: Normal  Communication   Communication: HOH  Cognition Arousal/Alertness: Awake/alert Behavior During Therapy: WFL for tasks assessed/performed Overall Cognitive Status: Within Functional Limits for tasks assessed                      General Comments      Exercises        Assessment/Plan    PT Assessment Patient needs continued PT services  PT Diagnosis Acute pain   PT Problem List Decreased activity tolerance;Decreased balance;Decreased mobility;Pain  PT Treatment Interventions Gait training;Stair training;Functional mobility training;Therapeutic activities;Therapeutic exercise;Balance training   PT Goals (Current goals can be found in the Care Plan section) Acute Rehab PT Goals Patient Stated Goal: pt wants to be in less pain  PT Goal Formulation: With patient Time For Goal Achievement: 08/08/15 Potential to Achieve Goals: Good Additional Goals Additional Goal #1: Pt will increase sitting tolerance to at least 1 hour in order to promote lung hygiene    Frequency Min 2X/week   Barriers to discharge        Co-evaluation               End of Session Equipment Utilized During Treatment: Gait belt Activity Tolerance: Patient tolerated treatment well;Patient limited by fatigue Patient left: in bed;with call bell/phone within reach;with bed alarm set           Time: 4782-9562 PT Time Calculation (min) (ACUTE ONLY): 24 min   Charges:         PT G CodesMilon Score 07/25/2015, 12:34 PM

## 2015-07-25 NOTE — Progress Notes (Signed)
Nutrition Follow-up    INTERVENTION:   Meals and Snacks: Cater to patient preferences Medical Food Supplement Therapy: if po intake inadequate on follow-up, recommend addition of nutritional supplement   NUTRITION DIAGNOSIS:   Inadequate oral intake related to altered GI function as evidenced by  (Liquid Diet). Improving as diet advanced postop  GOAL:   Patient will meet greater than or equal to 90% of their needs  MONITOR:    (Energy Intake, Digestive System, Anthropometrics, Electrolyte/Renal Profile)  REASON FOR ASSESSMENT:   Malnutrition Screening Tool    ASSESSMENT:    Diet Order:  DIET SOFT Room service appropriate?: Yes; Fluid consistency:: Thin   Energy Intake: tolerated CL diet, dist just advanced to Soft  Digestive System: ostomy with 700 mL out, no N/V   Electrolyte and Renal Profile:  Recent Labs Lab 07/22/15 0431 07/23/15 0536 07/25/15 0534  BUN 14 11 14   CREATININE 0.98 0.88 0.79  NA 136 135 135  K 3.9 3.8 3.1*   Glucose Profile:  Recent Labs  07/24/15 2124 07/25/15 0734 07/25/15 1123  GLUCAP 153* 166* 170*   .l  Electrolyte and Renal Profile:  Recent Labs Lab 07/22/15 0431 07/23/15 0536 07/25/15 0534  BUN 14 11 14   CREATININE 0.98 0.88 0.79  NA 136 135 135  K 3.9 3.8 3.1*   Glucose Profile:  Recent Labs  07/24/15 2124 07/25/15 0734 07/25/15 1123  GLUCAP 153* 166* 170*   Meds: ss novolog, lasix, lantus,   Height:   Ht Readings from Last 1 Encounters:  07/21/15 6' (1.829 m)    Weight:   Wt Readings from Last 1 Encounters:  07/21/15 192 lb 4.8 oz (87.227 kg)    BMI:  Body mass index is 26.07 kg/(m^2).  Estimated Nutritional Needs:   Kcal:  6440-3474 kcals (BEE 1638, 1.3 AF, 1.1-1.3 IF)   Protein:  104-122 g (1.2-1.4 g.kg)  Fluid:  2175-2610 mL (25-30 ml/kg)   MODERATE Care Level  Kerman Passey MS, RD, LDN [phone] Pager

## 2015-07-25 NOTE — Progress Notes (Signed)
Afebrile, blood pressure improved but still slightly elevated.  Excellent response to diuresis yesterday.  Some atypical chest pain overnight, patient reports marked improvement in general sense of well-being after for pediatric aspirin 2 mg of IV morphine.  Grade 2 advance diet. Excellent stoma output yesterday. Still some trouble with seal the colostomy site.  Lungs: Clear. Cardiac exam: Regular rhythm.  Abdomen: Scaphoid, soft, nontender.  Wound: clean.  Extremities: Calves, soft.  Colon labs reviewed, elevated white count, mild depression of potassium at 3.1. IV potassium ordered by medicine.  Sore in the perineum after long periods of sitting.  We'll see how he is able to void after removing his Foley today.  We'll advance diet. Oral potassium supplements. Anti-inflammatory's by mouth. Discontinue entereg as GI function improved.

## 2015-07-25 NOTE — Consult Note (Signed)
WOC ostomy follow up Stoma type/location: LUQ Colostomy. Patient has had three pouch changes since yesterday's visit from [facility] Nurse (10pm, 2am and just moments ago by his bedside nurse at 11am).  Declines pouch change at this time due to proximity of this visit to application of most recent/new  pouching system application.   Stomal assessment/size: Viewed through pouch to measure approximately 1 and 1/4 inch round. Aperture appears to be cut accurately for stomal size. Peristomal assessment: Not seen today Treatment options for stomal/peristomal skin: Skin barrier ring for next pouching change is indicated. Output: Nothing in pouch at this time, albeit patient, surgeon and bedside RN state adequate stomal output. Ostomy pouching: 2pc, . 2 and 1/4 inch pouching system with skin barrier rings (4 pouches, 4 skin barriers and 2 skin barrier rings) are provided at bedside today.  Education provided: Patient is upset about leaking pouching systems throughout the night and reassured that [place] Nurse will continue to support until a good fit is identified. He is somewhat reassured by this and reports that this is also what the surgeon told him this morning. He is requesting a "larger bag" thinking incorrectly that this will stop leakages.  Education session relaying that pouches are emptied when 1/3 to 1/2 full and that larger pouches are likely not the solution to the "fit" issue.  Convexity may be required and WOC Nurse will visit again tomorrow to assess for this need.  In the meantime, I have provided 4 skin barriers, 4 pouches and 2 skin barrier rings to the bedside (with a stoma measuring guide). Patient does not have a teaching booklet that I can see in the room and I will ask [place] Nurse to bring one with her tomorrow.  Patient will require the support of a HHRN post discharge.  If you agree, please order. Enrolled patient in [place] Discharge program: No. We do not yet have the optimal pouching  system for this patient, so enrollment in [employer] is deferred until we have a pouching system that lasts >24 hours and ideally, 48 hours.  [place] nursing team will follow, and will see tomorrow for continued support and teaching.  [facility] Nurse will remain available to this patient and the nursing, surgical and medical teams.   Thanks, Maudie Flakes, MSN, RN, [place], [place], [place] [phone])

## 2015-07-25 NOTE — Progress Notes (Signed)
[place] at [facility]                                                                                                                                                                                            Patient Demographics   Gary Hampton, is a 74 y.o. male, DOB - [birth year], ZOX:[id number]  Admit date - 07/21/2015   Admitting Physician Robert Bellow, MD  Outpatient Primary MD for the patient is [name],[name], MD   LOS - 4  Subjective:  Feeling better blood pressure improved abdominal pain improved   Review of Systems:   CONSTITUTIONAL: No documented fever. No fatigue, weakness. No weight gain, no weight loss.  EYES: No blurry or double vision.  ENT: No tinnitus. No postnasal drip. No redness of the oropharynx.  RESPIRATORY: No cough, no wheeze, no hemoptysis. No dyspnea.  CARDIOVASCULAR: Overnight chest pain. No orthopnea. No palpitations. No syncope.  GASTROINTESTINAL: No nausea, no vomiting or diarrhea. Positive abdominal pain. No melena or hematochezia.  GENITOURINARY: No dysuria or hematuria.  ENDOCRINE: No polyuria or nocturia. No heat or cold intolerance.  HEMATOLOGY: No anemia. No bruising. No bleeding.  INTEGUMENTARY: No rashes. No lesions.  MUSCULOSKELETAL: No arthritis. No swelling. No gout.  NEUROLOGIC: No numbness, tingling, or ataxia. No seizure-type activity.  PSYCHIATRIC: No anxiety. No insomnia. No ADD.    Vitals:   Filed Vitals:   07/24/15 2118 07/24/15 2221 07/24/15 2243 07/25/15 0638  BP: 157/69 184/80 169/79 143/79  Pulse: 98 104 100 100  Temp: 98.3 F (36.8 C) 98.7 F (37.1 C)  97.3 F (36.3 C)  TempSrc: Oral   Oral  Resp:  20  20  Height:      Weight:      SpO2: 94% 99%  98%    Wt Readings from Last 3 Encounters:  07/21/15 87.227 kg (192 lb 4.8 oz)  07/14/15 85.73 kg (189 lb)  06/21/15 83.462 kg (184 lb)     Intake/Output Summary (Last 24 hours) at 07/25/15 1116 Last data filed at 07/25/15  0735  Gross per 24 hour  Intake    699 ml  Output   3830 ml  Net  -3131 ml    Physical Exam:   GENERAL: Pleasant-appearing in no apparent distress.  HEAD, EYES, EARS, NOSE AND THROAT: Atraumatic, normocephalic. Extraocular muscles are intact. Pupils equal and reactive to light. Sclerae anicteric. No conjunctival injection. No oro-pharyngeal erythema.  NECK: Supple. There is no jugular venous distention. No bruits, no lymphadenopathy, no thyromegaly.  HEART: Regular rate and rhythm,. No murmurs, no rubs, no clicks.  LUNGS: Clear to  auscultation bilaterally. No rales or rhonchi. No wheezes.  ABDOMEN: Soft, flat, nontender, nondistended.  DECREASED BS EXTREMITIES: No evidence of any cyanosis, clubbing, or peripheral edema.  +2 pedal and radial pulses bilaterally.  NEUROLOGIC: The patient is alert, awake, and oriented x3 with no focal motor or sensory deficits appreciated bilaterally.  SKIN: Moist and warm with no rashes appreciated.  Psych: Not anxious, depressed LN: No inguinal LN enlargement    Antibiotics   Anti-infectives    Start     Dose/Rate Route Frequency Ordered Stop   07/21/15 0730  ertapenem (INVANZ) 1 g in sodium chloride 0.9 % 50 mL IVPB     1 g 100 mL/hr over 30 Minutes Intravenous On call to O.R. 07/21/15 1194 07/21/15 0920      Medications   Scheduled Meds: . enoxaparin (LOVENOX) injection  40 mg Subcutaneous Q24H  . insulin aspart  0-15 Units Subcutaneous TID WC  . insulin glargine  6 Units Subcutaneous Q lunch  . meloxicam  7.5 mg Oral Daily  . potassium chloride  20 mEq Oral BID  . zolpidem  5 mg Oral QHS   Continuous Infusions:   PRN Meds:.acetaminophen **OR** [DISCONTINUED] acetaminophen, hydrALAZINE, menthol-cetylpyridinium, morphine injection, oxyCODONE, promethazine   Data Review:   Micro Results Recent Results (from the past 240 hour(s))  Surgical pcr screen     Status: None   Collection Time: 07/17/15  3:18 PM  Result Value Ref Range Status    MRSA, PCR NEGATIVE NEGATIVE Final   Staphylococcus aureus NEGATIVE NEGATIVE Final    Radiology Reports No results found.   CBC  Recent Labs Lab 07/22/15 0431 07/23/15 0536 07/25/15 0534  WBC 8.4 8.4 13.6*  HGB 11.3* 10.5* 11.5*  HCT 33.2* 30.6* 32.7*  PLT 161 140* 202  MCV 96.5 96.0 94.0  MCH 33.0 32.9 33.2  MCHC 34.2 34.2 35.3  RDW 12.8 13.1 12.6  LYMPHSABS  --   --  0.7*  MONOABS  --   --  0.9  EOSABS  --   --  0.3  BASOSABS  --   --  0.0    Chemistries   Recent Labs Lab 07/22/15 0431 07/23/15 0536 07/25/15 0534  NA 136 135 135  K 3.9 3.8 3.1*  CL 106 103 97*  CO2 26 28 28   GLUCOSE 279* 246* 175*  BUN 14 11 14   CREATININE 0.98 0.88 0.79  CALCIUM 7.6* 7.7* 7.5*   ------------------------------------------------------------------------------------------------------------------ estimated creatinine clearance is 87.6 mL/min (by C-G formula based on Cr of 0.79). ------------------------------------------------------------------------------------------------------------------ No results for input(s): HGBA1C in the last 72 hours. ------------------------------------------------------------------------------------------------------------------ No results for input(s): CHOL, HDL, LDLCALC, TRIG, CHOLHDL, LDLDIRECT in the last 72 hours. ------------------------------------------------------------------------------------------------------------------ No results for input(s): TSH, T4TOTAL, T3FREE, THYROIDAB in the last 72 hours.  Invalid input(s): FREET3 ------------------------------------------------------------------------------------------------------------------ No results for input(s): VITAMINB12, FOLATE, FERRITIN, TIBC, IRON, RETICCTPCT in the last 72 hours.  Coagulation profile No results for input(s): INR, PROTIME in the last 168 hours.  No results for input(s): DDIMER in the last 72 hours.  Cardiac Enzymes No results for input(s): CKMB, TROPONINI,  MYOGLOBIN in the last 168 hours.  Invalid input(s): CK ------------------------------------------------------------------------------------------------------------------ Invalid input(s): POCBNP    Assessment & Plan   74 year old Gary Hampton with past medical history of anal cancer, type 2 diabetes on insulin, impaired hearing was admitted for elective surgery.     #1 diabetes insulin requiring. Continue Lantus and sliding scale insulin, blood sugar improved   #2 history of rectal cancer status post adjuvant  chemotherapy status post abdominal perineal resection with permanent end colostomy, right hemicolectomy with ileocolostomy management per surgery.   3. DVT prophylaxis subcutaneous Lovenox.  4. Insomnia Ambien at nighttime  5. Accelerated blood pressure  blood pressure now improved continue to monitor use when necessary hydralazine IV   All th records are reviewed and case discussed with Consulting provider. Management plans discussed with the patient, family and they are in agreement.  CODE STATUS: Full      Code Status Orders        Start     Ordered   07/21/15 1558  Full code   Continuous     07/21/15 1557              DVT Prophylaxis  SCDS  Lab Results  Component Value Date   PLT 202 07/25/2015     Time Spent in minutes66min     PATEL, SHREYANG M.D on 07/25/2015 at 11:16 AM  Between 7am to 6pm - Pager - [phone]  After 6pm go to [email or web address] - password [email or web address] [facility]   Office  [phone]

## 2015-07-25 NOTE — Op Note (Signed)
Preop diagnosis1. Carcinoma rectum . 2. Multiple right colon polyps Post op diagnosis: Same  Operation: Right hemicolectomy  Surgeon: S.G.Sankar  Assistant:     Anesthesia: Gen.  Complications: None  EBL: Less than 50 mL  Drains: None  Description: This 74 year old male was undergoing planned AP resection of rectosigmoid along with a right hemicolectomy he had previous rectal cancer treated with chemoradiation. Colonoscopy has revealed multiple polyps in the right colon. Abdominoperineal resection was started first by Dr. Bary Castilla with my assistance. After the abdominal dissection of the rectum and sigmoid area was completed Dr. Tollie Pizza started the perineal prep dissection. At this time a right hemicolectomy was performed or the multiple polyps. The right colon was adequately mobilized along the lateral aspect and along the hepatic flexure with the use of the Harmonic scalpel. Palpation did reveal a sizable 3 cm polyp in the midascending colon. Lines of resection and were mapped in the terminal ileum and in the junction of the first and second portions of the transverse colon. The mesenteric attachments here were freed and the mesentery was scored down to the origin of the right colic artery at this level the main artery and vein were separately clamped cut and ligated with 2-0 silk. The rest of the mesenteric attachment along the lines of resection were taken down with the use of the Harmonic scalpel. The terminal ileum and the transverse colon were then brought together at the planned site of resection and a side to side anastomosis was performed with the use of a GIA. Using a second load of GIA the bowel was transected and the remaining opening at the anastomosis closed in a T fashion. The resected right colon was then opened at the end of the procedure for verification of polyps and margin and then placed in formalin and sent to pathology. The corners of the anastomotic sites were reinforced  with the use of 5 3-0 silk and the peritoneal opening in the mesentery was closed with a running 3-0 Vicryl stitch. Following this the perineal portion of the AP resection was completed and the rectosigmoid area was removed the colostomy was then brought up in the left upper quadrant at the planned site. The end of the colon was brought out through a incision going through the rectus fascia with a 2 fingerbreadth opening this was pulled up to allow for adequate  maturing of the colostomy. The colon was approximated to the fascia with interrupted 2-0 Vicryl stitches. Rest of the dictation has been done by Dr. Bary Castilla.

## 2015-07-26 LAB — GLUCOSE, CAPILLARY
GLUCOSE-CAPILLARY: 221 mg/dL — AB (ref 65–99)
GLUCOSE-CAPILLARY: 179 mg/dL — AB (ref 65–99)
Glucose-Capillary: 229 mg/dL — ABNORMAL HIGH (ref 65–99)
Glucose-Capillary: 186 mg/dL — ABNORMAL HIGH (ref 65–99)

## 2015-07-26 LAB — BASIC METABOLIC PANEL
Anion gap: 12 (ref 5–15)
BUN: 17 mg/dL (ref 6–20)
CHLORIDE: 92 mmol/L — AB (ref 101–111)
CO2: 27 mmol/L (ref 22–32)
CREATININE: 0.77 mg/dL (ref 0.61–1.24)
Calcium: 7.6 mg/dL — ABNORMAL LOW (ref 8.9–10.3)
Glucose, Bld: 239 mg/dL — ABNORMAL HIGH (ref 65–99)
POTASSIUM: 3.7 mmol/L (ref 3.5–5.1)
SODIUM: 131 mmol/L — AB (ref 135–145)

## 2015-07-26 MED ORDER — CALCIUM POLYCARBOPHIL 625 MG PO TABS
625.0000 mg | ORAL_TABLET | Freq: Two times a day (BID) | ORAL | Status: DC
  Administered 2015-07-26: 625 mg via ORAL
  Filled 2015-07-26 (×5): qty 1

## 2015-07-26 MED ORDER — LISINOPRIL 5 MG PO TABS
5.0000 mg | ORAL_TABLET | Freq: Every day | ORAL | Status: DC
  Administered 2015-07-26 – 2015-07-27 (×2): 5 mg via ORAL
  Filled 2015-07-26 (×2): qty 1

## 2015-07-26 NOTE — Consult Note (Signed)
WOC ostomy consult note Stoma type/location: LUQ Colostomy.  Pouch change completed/teaching completed today.  Stomal assessment/size: 1 1/4" round, slightly budded pink stoma.  Patent and producing liquid green stool.  Peristomal assessment: Midline staple line, creasing at 9:00.   Treatment options for stomal/peristomal skin: Will implement a convex pouch and a barrier ring today. 1 piece system for increased flexibility.  Output Liquid green stool Ostomy pouching: 1pc.convex with barrier ring Education provided: Patient able to measure stoma, cut pouch opening today.  Barrier ring applied.  Understands rationale for 1 piece convex and barrier ring and agrees this may help.  Continue to inquire about a large pouch to wear at bedtime to avoid leakage.  Practiced emptying pouch and encouraged him to call for help and empty when 1/3 full. New booklet provided to patient. He brought his book from home, he states, but is not sure where it is.   Patient is to be discharged today or tomorrow to SNF for rehab.  Has 4 pouching systems to take with him, including barrier rings. Enrolled patient in [place] program: Yes  Today. [place] team will follow through discharge.  Domenic Moras RN BSN [place] Pager [phone]

## 2015-07-26 NOTE — Clinical Social Work Note (Signed)
Bed offers extended to patient this morning and he has chosen [facility]. MD updated and stated discharge planned for tomorrow. Doug at [facility] as well.  Shela Leff MSW,LCSW [phone]

## 2015-07-26 NOTE — Progress Notes (Signed)
[place] at [facility]                                                                                                                                                                                            Patient Demographics   Gary Hampton, is a 74 y.o. male, DOB - 28-Jun-1941, TUU:[id number]  Admit date - 07/21/2015   Admitting Physician Robert Bellow, MD  Outpatient Primary MD for the patient is [name],[name], MD   LOS - 5  Subjective: No acute events overnight.  BP a bit uncontrolled and pt. Was not on any BP meds prior to coming to the hospital.     Review of Systems:   CONSTITUTIONAL: No documented fever. No fatigue, weakness. No weight gain, no weight loss.  EYES: No blurry or double vision.  ENT: No tinnitus. No postnasal drip. No redness of the oropharynx.  RESPIRATORY: No cough, no wheeze, no hemoptysis. No dyspnea.  CARDIOVASCULAR: Overnight chest pain. No orthopnea. No palpitations. No syncope.  GASTROINTESTINAL: No nausea, no vomiting or diarrhea. Positive abdominal pain. No melena or hematochezia.  GENITOURINARY: No dysuria or hematuria.  ENDOCRINE: No polyuria or nocturia. No heat or cold intolerance.  HEMATOLOGY: No anemia. No bruising. No bleeding.  INTEGUMENTARY: No rashes. No lesions.  MUSCULOSKELETAL: No arthritis. No swelling. No gout.  NEUROLOGIC: No numbness, tingling, or ataxia. No seizure-type activity.  PSYCHIATRIC: No anxiety. No insomnia. No ADD.    Vitals:   Filed Vitals:   07/26/15 0014 07/26/15 0021 07/26/15 0643 07/26/15 1233  BP: 179/75 162/80 149/56 160/77  Pulse: 98  97 103  Temp:   98.3 F (36.8 C) 97.2 F (36.2 C)  TempSrc:   Oral Axillary  Resp:   24   Height:      Weight:      SpO2:   95% 98%    Wt Readings from Last 3 Encounters:  07/21/15 87.227 kg (192 lb 4.8 oz)  07/14/15 85.73 kg (189 lb)  06/21/15 83.462 kg (184 lb)     Intake/Output Summary (Last 24 hours) at 07/26/15  1524 Last data filed at 07/26/15 1300  Gross per 24 hour  Intake    480 ml  Output   2020 ml  Net  -1540 ml    Physical Exam:   GENERAL: Pleasant-appearing in no apparent distress.  HEAD, EYES, EARS, NOSE AND THROAT: Atraumatic, normocephalic. Extraocular muscles are intact. Pupils equal and reactive to light. Sclerae anicteric. No conjunctival injection. No oro-pharyngeal erythema.  NECK: Supple. There is no jugular venous distention. No bruits, no lymphadenopathy, no thyromegaly.  HEART: Regular  rate and rhythm,. No murmurs, no rubs, no clicks.  LUNGS: Clear to auscultation bilaterally. No rales or rhonchi. No wheezes.  ABDOMEN: Soft, flat, nontender, nondistended.  Hypoactive BS.  No organomegaly EXTREMITIES: No evidence of any cyanosis, clubbing, or peripheral edema.  +2 pedal and radial pulses bilaterally.  NEUROLOGIC: The patient is alert, awake, and oriented x3 with no focal motor or sensory deficits appreciated bilaterally.  SKIN: Moist and warm with no rashes appreciated.     Antibiotics   Anti-infectives    Start     Dose/Rate Route Frequency Ordered Stop   07/21/15 0730  ertapenem (INVANZ) 1 g in sodium chloride 0.9 % 50 mL IVPB     1 g 100 mL/hr over 30 Minutes Intravenous On call to O.R. 07/21/15 0960 07/21/15 0920      Medications   Scheduled Meds: . enoxaparin (LOVENOX) injection  40 mg Subcutaneous Q24H  . insulin aspart  0-15 Units Subcutaneous TID WC  . insulin glargine  10 Units Subcutaneous Q lunch  . lisinopril  5 mg Oral Daily  . meloxicam  7.5 mg Oral Daily  . polycarbophil  625 mg Oral BID  . potassium chloride  20 mEq Oral BID  . zolpidem  5 mg Oral QHS   Continuous Infusions:   PRN Meds:.acetaminophen **OR** [DISCONTINUED] acetaminophen, hydrALAZINE, menthol-cetylpyridinium, morphine injection, oxyCODONE, promethazine   Data Review:   Micro Results Recent Results (from the past 240 hour(s))  Surgical pcr screen     Status: None    Collection Time: 07/17/15  3:18 PM  Result Value Ref Range Status   MRSA, PCR NEGATIVE NEGATIVE Final   Staphylococcus aureus NEGATIVE NEGATIVE Final    Radiology Reports No results found.   CBC  Recent Labs Lab 07/22/15 0431 07/23/15 0536 07/25/15 0534  WBC 8.4 8.4 13.6*  HGB 11.3* 10.5* 11.5*  HCT 33.2* 30.6* 32.7*  PLT 161 140* 202  MCV 96.5 96.0 94.0  MCH 33.0 32.9 33.2  MCHC 34.2 34.2 35.3  RDW 12.8 13.1 12.6  LYMPHSABS  --   --  0.7*  MONOABS  --   --  0.9  EOSABS  --   --  0.3  BASOSABS  --   --  0.0    Chemistries   Recent Labs Lab 07/22/15 0431 07/23/15 0536 07/25/15 0534 07/26/15 0523  NA 136 135 135 131*  K 3.9 3.8 3.1* 3.7  CL 106 103 97* 92*  CO2 26 28 28 27   GLUCOSE 279* 246* 175* 239*  BUN 14 11 14 17   CREATININE 0.98 0.88 0.79 0.77  CALCIUM 7.6* 7.7* 7.5* 7.6*   ------------------------------------------------------------------------------------------------------------------ estimated creatinine clearance is 87.6 mL/min (by C-G formula based on Cr of 0.77). ------------------------------------------------------------------------------------------------------------------ No results for input(s): HGBA1C in the last 72 hours. ------------------------------------------------------------------------------------------------------------------ No results for input(s): CHOL, HDL, LDLCALC, TRIG, CHOLHDL, LDLDIRECT in the last 72 hours. ------------------------------------------------------------------------------------------------------------------ No results for input(s): TSH, T4TOTAL, T3FREE, THYROIDAB in the last 72 hours.  Invalid input(s): FREET3 ------------------------------------------------------------------------------------------------------------------ No results for input(s): VITAMINB12, FOLATE, FERRITIN, TIBC, IRON, RETICCTPCT in the last 72 hours.  Coagulation profile No results for input(s): INR, PROTIME in the last 168 hours.  No  results for input(s): DDIMER in the last 72 hours.  Cardiac Enzymes No results for input(s): CKMB, TROPONINI, MYOGLOBIN in the last 168 hours.  Invalid input(s): CK ------------------------------------------------------------------------------------------------------------------ Invalid input(s): POCBNP    Assessment & Plan   74 year old Mr. Gary with past medical history of anal cancer, type 2 diabetes on insulin, impaired  hearing was admitted for elective surgery.     #1diabetes type 2 insulin requiring. Continue Lantus and sliding scale insulin -Blood sugar stable  #2 history of rectal cancer status post adjuvant chemotherapy status post abdominal perineal resection with permanent end colostomy, right hemicolectomy with ileocolostomy -Continue care as per surgery   #3 hypertension-patient's blood pressure has been somewhat uncontrolled. He likely has undiagnosed hypertension. Given his history of diabetes I will start him on some low-dose lisinopril. We will advance if needed. -Continue when necessary hydralazine.  #4. Insomnia Ambien at nighttime   All th records are reviewed and case discussed with Consulting provider. Management plans discussed with the patient, family and they are in agreement.  CODE STATUS: Full      Code Status Orders        Start     Ordered   07/21/15 1558  Full code   Continuous     07/21/15 1557              DVT Prophylaxis  SCDS  Lab Results  Component Value Date   PLT 202 07/25/2015     Time Spent in minutes 25 min     Abel Presto J M.D on 07/26/2015 at 3:24 PM  Between 7am to 6pm - Pager - [phone]  After 6pm go to [email or web address] - password [email or web address] [place] [facility]   Office  [phone]

## 2015-07-26 NOTE — Progress Notes (Signed)
Afebrile, BP still elevated over baseline.  Was able to void post catheter removal.  Tolerated and stylette well. Sugars trending up a little bit in this regard.  Only complaint is perineal discomfort. No nausea or vomiting. Stoma device or effective, no leaks yesterday.  Chest: Clear.  Abdomen: Scaphoid, soft, nontender.  Abdominal wound: Healing well. Staples intact.  Perineum: Some slight separation without drainage. Blake drain remains intact with serosanguineous drainage.  Basic metabolic panel: Mild hyponatremia. Potassium replenishment. (3.7).  Yesterday CBC showed a white cell count elevation at 13,000, no clinical evidence of infection at this time.  Patient has done fairly well with ambulation, but still is somewhat weak. He may benefit from skilled nursing placement. Discussed with care management.  We'll recheck his CBC in the morning, earlier if transfer is pending today.  We will look for medicine recommendations regarding antihypertensives therapy.

## 2015-07-26 NOTE — Care Management Important Message (Signed)
Important Message  Patient Details  Name: Gary Hampton MRN: [id number] Date of Birth: [birth year]   Medicare Important Message Given:  Yes-third notification given    [name] 07/26/2015, 10:21 AM

## 2015-07-26 NOTE — Progress Notes (Signed)
Physical Therapy Treatment Patient Details Name: Gary Hampton MRN: [id number] DOB: 09/28/1941 Today's Date: 07/26/2015    History of Present Illness 74 year old Gary Hampton with past medical history of anal cancer, type 2 diabetes on insulin, impaired hearing was admitted for elective surgery. Pt s/p perineal resection w/ R hemi colectomy and colostomy.     PT Comments    Pt stated that his posterior incision site was bothering him today; was considerably sore compared to his anterior site. Pt advised that there is some slight separation of this suture site and to be careful with his bed mobility in order to avoid applying any shearing force to his posterior incision site. Pt did well with mobility today utilizing a RW for support and CGA. Pt ambulated >200 ft x 2 with good step and stride length. Since pt used a quad cane for ambulation prior to admission; pt will benefit from using a quad cane for ambulation next treatment session in order to assess his gait stability as compared to baseline. Current d/c recommendations for this pt remain appropriate.    Follow Up Recommendations  SNF     Equipment Recommendations       Recommendations for Other Services       Precautions / Restrictions Precautions Precautions: Other (comment) Precaution Comments: JP drain Restrictions Weight Bearing Restrictions: No    Mobility  Bed Mobility Overal bed mobility: Modified Independent             General bed mobility comments: used bed rail for supine <>seated on EOB transfer  Transfers Overall transfer level: Needs assistance Equipment used: Rolling walker (2 wheeled) Transfers: Sit to/from Stand Sit to Stand: Supervision         General transfer comment: Pt displayed some impusliveness with sit <>stand transfer today.   Ambulation/Gait   Ambulation Distance (Feet):  (>200 ft x 2) Assistive device: Rolling walker (2 wheeled) Gait Pattern/deviations: WFL(Within Functional  Limits);Trunk flexed   Gait velocity interpretation: <1.8 ft/sec, indicative of risk for recurrent falls General Gait Details: pt walks with forward flexion of neck/trunk. Does well with stride length and step lengths symmetrical    Stairs            Wheelchair Mobility    Modified Rankin (Stroke Patients Only)       Balance Overall balance assessment: Needs assistance Sitting-balance support: No upper extremity supported;Feet supported Sitting balance-Leahy Scale: Good       Standing balance-Leahy Scale: Fair Standing balance comment: will attempt to use quad cane to assess pt's standing balance to baseline.                     Cognition Arousal/Alertness: Awake/alert Behavior During Therapy: WFL for tasks assessed/performed Overall Cognitive Status: Within Functional Limits for tasks assessed                      Exercises Total Joint Exercises Ankle Circles/Pumps: AROM;Both;15 reps;Seated Long Arc Quad: Strengthening;15 reps;Seated;Both Marching in Standing: Strengthening;Both;15 reps;Seated    General Comments        Pertinent Vitals/Pain Pain Location: Pt states that his surgical site on his backside is particularly sore today. Pain Descriptors / Indicators: Sore    Home Living                      Prior Function            PT Goals (current goals can now be found in the  care plan section) Acute Rehab PT Goals Patient Stated Goal: pt wants to be in less pain  PT Goal Formulation: With patient Time For Goal Achievement: 08/08/15 Potential to Achieve Goals: Good Additional Goals Additional Goal #1: Pt will increase sitting tolerance to at least 1 hour in order to promote lung hygiene Progress towards PT goals: Progressing toward goals    Frequency  Min 2X/week    PT Plan Current plan remains appropriate    Co-evaluation             End of Session Equipment Utilized During Treatment: Gait belt Activity  Tolerance: Patient tolerated treatment well Patient left: in bed;with bed alarm set;with call bell/phone within reach     Time: 1132-1149 PT Time Calculation (min) (ACUTE ONLY): 17 min  Charges:                       G CodesMilon Score  07/26/2015, 12:20 PM

## 2015-07-27 LAB — CBC WITH DIFFERENTIAL/PLATELET
BASOS PCT: 1 %
Basophils Absolute: 0 10*3/uL (ref 0–0.1)
EOS ABS: 0.6 10*3/uL (ref 0–0.7)
Eosinophils Relative: 7 %
HCT: 30.5 % — ABNORMAL LOW (ref 40.0–52.0)
Hemoglobin: 10.7 g/dL — ABNORMAL LOW (ref 13.0–18.0)
LYMPHS ABS: 0.7 10*3/uL — AB (ref 1.0–3.6)
Lymphocytes Relative: 7 %
MCH: 32.6 pg (ref 26.0–34.0)
MCHC: 35 g/dL (ref 32.0–36.0)
MCV: 93.1 fL (ref 80.0–100.0)
MONO ABS: 0.7 10*3/uL (ref 0.2–1.0)
MONOS PCT: 8 %
Neutro Abs: 7.3 10*3/uL — ABNORMAL HIGH (ref 1.4–6.5)
Neutrophils Relative %: 77 %
Platelets: 234 10*3/uL (ref 150–440)
RBC: 3.28 MIL/uL — ABNORMAL LOW (ref 4.40–5.90)
RDW: 12.4 % (ref 11.5–14.5)
WBC: 9.3 10*3/uL (ref 3.8–10.6)

## 2015-07-27 LAB — GLUCOSE, CAPILLARY
GLUCOSE-CAPILLARY: 214 mg/dL — AB (ref 65–99)
Glucose-Capillary: 259 mg/dL — ABNORMAL HIGH (ref 65–99)

## 2015-07-27 MED ORDER — MENTHOL 3 MG MT LOZG: 1.0000 | LOZENGE | OROMUCOSAL | Status: DC | PRN

## 2015-07-27 MED ORDER — LISINOPRIL 5 MG PO TABS: 5.0000 mg | ORAL_TABLET | Freq: Every day | ORAL | Status: DC

## 2015-07-27 MED ORDER — TEMAZEPAM 7.5 MG PO CAPS: 7.5000 mg | ORAL_CAPSULE | Freq: Every evening | ORAL | Status: DC | PRN

## 2015-07-27 MED ORDER — INSULIN GLARGINE 100 UNIT/ML ~~LOC~~ SOLN
20.0000 [IU] | Freq: Every day | SUBCUTANEOUS | Status: DC
Start: 2015-07-27 — End: 2016-01-18

## 2015-07-27 MED ORDER — INSULIN ASPART 100 UNIT/ML ~~LOC~~ SOLN: 0.0000 [IU] | Freq: Three times a day (TID) | SUBCUTANEOUS | Status: DC

## 2015-07-27 MED ORDER — CALCIUM POLYCARBOPHIL 625 MG PO TABS: 1250.0000 mg | ORAL_TABLET | Freq: Two times a day (BID) | ORAL | Status: DC

## 2015-07-27 MED ORDER — POTASSIUM CHLORIDE CRYS ER 20 MEQ PO TBCR: 20.0000 meq | EXTENDED_RELEASE_TABLET | Freq: Every day | ORAL | Status: DC

## 2015-07-27 MED ORDER — MELOXICAM 7.5 MG PO TABS: 7.5000 mg | ORAL_TABLET | Freq: Every day | ORAL | Status: DC

## 2015-07-27 MED ORDER — ACETAMINOPHEN 325 MG PO TABS: 650.0000 mg | ORAL_TABLET | Freq: Four times a day (QID) | ORAL | Status: AC | PRN

## 2015-07-27 MED ORDER — OXYCODONE HCL 5 MG PO TABS: 5.0000 mg | ORAL_TABLET | ORAL | Status: DC | PRN

## 2015-07-27 NOTE — Clinical Social Work Note (Signed)
Patient to discharge today to [facility]. Discharge summary sent to [facility] and patient to have friend transport. Nurse aware to send extra ostomy supplies with patient. Shela Leff MSW,LCSW [phone]

## 2015-07-27 NOTE — Clinical Social Work Placement (Signed)
   CLINICAL SOCIAL WORK PLACEMENT  NOTE  Date:  07/27/2015  Patient Details  Name: Gary Hampton MRN: [id number] Date of Birth: [birth year]  Clinical Social Work is seeking post-discharge placement for this patient at the [place] level of care (*CSW will initial, date and re-position this form in  chart as items are completed):  Yes   Patient/family provided with [place] Work Department's list of facilities offering this level of care within the geographic area requested by the patient (or if unable, by the patient's family).  Yes   Patient/family informed of their freedom to choose among providers that offer the needed level of care, that participate in Medicare, Medicaid or managed care program needed by the patient, have an available bed and are willing to accept the patient.  Yes   Patient/family informed of [facility]'s ownership interest in [facility] and [facility], as well as of the fact that they are under no obligation to receive care at these facilities.  PASRR submitted to EDS on 07/25/15     PASRR number received on 07/25/15     Existing PASRR number confirmed on       FL2 transmitted to all facilities in geographic area requested by pt/family on 07/25/15     FL2 transmitted to all facilities within larger geographic area on       Patient informed that his/her managed care company has contracts with or will negotiate with certain facilities, including the following:        Yes   Patient/family informed of bed offers received.  Patient chooses bed at  [facility])     Physician recommends and patient chooses bed at  [facility])    Patient to be transferred to  [facility]) on 07/27/15.  Patient to be transferred to facility by  (friend)     Patient family notified on 07/27/15 of transfer.  Name of family member notified:        PHYSICIAN Please prepare priority discharge summary, including medications, Please  sign FL2     Additional Comment:    _______________________________________________ Shela Leff, LCSW 07/27/2015, 9:54 AM

## 2015-07-27 NOTE — Progress Notes (Signed)
Alert and oriented. Vss. No signs of acute distress. Discharge instructions  given. Patient verbalized understanding. 

## 2015-07-27 NOTE — Progress Notes (Signed)
[place] at [facility]                                                                                                                                                                                            Patient Demographics   Markevious Ehmke, is a 74 y.o. male, DOB - Apr 17, 1941, EQA:[id number]  Admit date - 07/21/2015   Admitting Physician Robert Bellow, MD  Outpatient Primary MD for the patient is [name],[name], MD   LOS - 6  Subjective: No acute events overnight. BP much improved today.  No complaints. D/C to SNF today.   Review of Systems:   CONSTITUTIONAL: No documented fever. No fatigue, weakness. No weight gain, no weight loss.  EYES: No blurry or double vision.  ENT: No tinnitus. No postnasal drip. No redness of the oropharynx.  RESPIRATORY: No cough, no wheeze, no hemoptysis. No dyspnea.  CARDIOVASCULAR: No chest pain No orthopnea. No palpitations. No syncope.  GASTROINTESTINAL: No nausea, no vomiting or diarrhea. Positive abdominal pain. No melena or hematochezia.  GENITOURINARY: No dysuria or hematuria.  ENDOCRINE: No polyuria or nocturia. No heat or cold intolerance.  HEMATOLOGY: No anemia. No bruising. No bleeding.  INTEGUMENTARY: No rashes. No lesions.  MUSCULOSKELETAL: No arthritis. No swelling. No gout.  NEUROLOGIC: No numbness, tingling, or ataxia. No seizure-type activity.  PSYCHIATRIC: No anxiety. No insomnia. No ADD.     Vitals:   Filed Vitals:   07/26/15 1233 07/26/15 2015 07/27/15 0424 07/27/15 0945  BP: 160/77 152/67 148/61 133/58  Pulse: 103 95 91 101  Temp: 97.2 F (36.2 C) 98 F (36.7 C) 97.7 F (36.5 C) 97.7 F (36.5 C)  TempSrc: Axillary Oral Oral Oral  Resp:  18 17 17   Height:      Weight:      SpO2: 98% 98% 96% 95%    Wt Readings from Last 3 Encounters:  07/21/15 87.227 kg (192 lb 4.8 oz)  07/14/15 85.73 kg (189 lb)  06/21/15 83.462 kg (184 lb)     Intake/Output Summary (Last 24 hours) at  07/27/15 0946 Last data filed at 07/27/15 0630  Gross per 24 hour  Intake    120 ml  Output   2110 ml  Net  -1990 ml    Physical Exam:   GENERAL: Pleasant-appearing in no apparent distress.  HEAD, EYES, EARS, NOSE AND THROAT: Atraumatic, normocephalic. Extraocular muscles are intact. Pupils equal and reactive to light. Sclerae anicteric. No conjunctival injection. No oro-pharyngeal erythema.  NECK: Supple. There is no jugular venous distention. No bruits, no lymphadenopathy, no thyromegaly.  HEART: Regular rate and rhythm,.  No murmurs, no rubs, no clicks.  LUNGS: Clear to auscultation bilaterally. No rales or rhonchi. No wheezes.  ABDOMEN: Soft, flat, nontender, nondistended. Positive mid abdomen staples. + colostomy with liquid stool.  Hypoactive BS.  No organomegaly EXTREMITIES: No evidence of any cyanosis, clubbing, or peripheral edema.  +2 pedal and radial pulses bilaterally.  NEUROLOGIC: The patient is alert, awake, and oriented x3 with no focal motor or sensory deficits appreciated bilaterally.  SKIN: Moist and warm with no rashes appreciated.     Antibiotics   Anti-infectives    Start     Dose/Rate Route Frequency Ordered Stop   07/21/15 0730  ertapenem (INVANZ) 1 g in sodium chloride 0.9 % 50 mL IVPB     1 g 100 mL/hr over 30 Minutes Intravenous On call to O.R. 07/21/15 8676 07/21/15 0920      Medications   Scheduled Meds: . enoxaparin (LOVENOX) injection  40 mg Subcutaneous Q24H  . insulin aspart  0-15 Units Subcutaneous TID WC  . insulin glargine  10 Units Subcutaneous Q lunch  . lisinopril  5 mg Oral Daily  . meloxicam  7.5 mg Oral Daily  . polycarbophil  625 mg Oral BID  . potassium chloride  20 mEq Oral BID  . zolpidem  5 mg Oral QHS   Continuous Infusions:   PRN Meds:.acetaminophen **OR** [DISCONTINUED] acetaminophen, hydrALAZINE, menthol-cetylpyridinium, morphine injection, oxyCODONE, promethazine   Data Review:   Micro Results Recent Results (from  the past 240 hour(s))  Surgical pcr screen     Status: None   Collection Time: 07/17/15  3:18 PM  Result Value Ref Range Status   MRSA, PCR NEGATIVE NEGATIVE Final   Staphylococcus aureus NEGATIVE NEGATIVE Final    Radiology Reports No results found.   CBC  Recent Labs Lab 07/22/15 0431 07/23/15 0536 07/25/15 0534 07/27/15 0420  WBC 8.4 8.4 13.6* 9.3  HGB 11.3* 10.5* 11.5* 10.7*  HCT 33.2* 30.6* 32.7* 30.5*  PLT 161 140* 202 234  MCV 96.5 96.0 94.0 93.1  MCH 33.0 32.9 33.2 32.6  MCHC 34.2 34.2 35.3 35.0  RDW 12.8 13.1 12.6 12.4  LYMPHSABS  --   --  0.7* 0.7*  MONOABS  --   --  0.9 0.7  EOSABS  --   --  0.3 0.6  BASOSABS  --   --  0.0 0.0    Chemistries   Recent Labs Lab 07/22/15 0431 07/23/15 0536 07/25/15 0534 07/26/15 0523  NA 136 135 135 131*  K 3.9 3.8 3.1* 3.7  CL 106 103 97* 92*  CO2 26 28 28 27   GLUCOSE 279* 246* 175* 239*  BUN 14 11 14 17   CREATININE 0.98 0.88 0.79 0.77  CALCIUM 7.6* 7.7* 7.5* 7.6*   ------------------------------------------------------------------------------------------------------------------ estimated creatinine clearance is 87.6 mL/min (by C-G formula based on Cr of 0.77). ------------------------------------------------------------------------------------------------------------------ No results for input(s): HGBA1C in the last 72 hours. ------------------------------------------------------------------------------------------------------------------ No results for input(s): CHOL, HDL, LDLCALC, TRIG, CHOLHDL, LDLDIRECT in the last 72 hours. ------------------------------------------------------------------------------------------------------------------ No results for input(s): TSH, T4TOTAL, T3FREE, THYROIDAB in the last 72 hours.  Invalid input(s): FREET3 ------------------------------------------------------------------------------------------------------------------ No results for input(s): VITAMINB12, FOLATE, FERRITIN,  TIBC, IRON, RETICCTPCT in the last 72 hours.  Coagulation profile No results for input(s): INR, PROTIME in the last 168 hours.  No results for input(s): DDIMER in the last 72 hours.  Cardiac Enzymes No results for input(s): CKMB, TROPONINI, MYOGLOBIN in the last 168 hours.  Invalid input(s): CK ------------------------------------------------------------------------------------------------------------------ Invalid input(s): POCBNP    Assessment &  Plan   74 year old Mr. Vallone with past medical history of anal cancer, type 2 diabetes on insulin, impaired hearing was admitted for elective surgery.     #1diabetes type 2 insulin requiring - BS stable and resume Lantus, Aspart with meals on discharge.   #2 history of rectal cancer status post adjuvant chemotherapy status post abdominal perineal resection with permanent end colostomy, right hemicolectomy with ileocolostomy -Continue care as per surgery   #3 hypertension-BP improved w/ Lisinopril and will d/c to SNF on that dose.   - titrate BP meds as needed as outpatient.    #4. Insomnia Ambien at nighttime  Stable for d/c from medical standpoint to SNF.    All th records are reviewed and case discussed with Consulting provider. Management plans discussed with the patient, family and they are in agreement.  CODE STATUS: Full      Code Status Orders        Start     Ordered   07/21/15 1558  Full code   Continuous     07/21/15 1557              DVT Prophylaxis  SCDS  Lab Results  Component Value Date   PLT 234 07/27/2015     Time Spent in minutes 25 min     SAINANI,VIVEK J M.D on 07/27/2015 at 9:46 AM  Between 7am to 6pm - Pager - [phone]  After 6pm go to [email or web address] - password [email or web address] [place] [facility]   Office  [phone]

## 2015-07-27 NOTE — Discharge Summary (Signed)
Physician Discharge Summary  Patient ID: Gary Hampton MRN: [id number] DOB/AGE: Mar 04, 1941 74 y.o.  Admit date: 07/21/2015 Discharge date: 07/27/2015  Admission Diagnoses: Rectal cancer, right colon polyps.  Discharge Diagnoses: Same. Active Problems:   Rectal adenocarcinoma Insulin-dependent diabetes mellitus.  Essential hypertension.   Discharged Condition: good  Hospital Course: This 74 year old male underwent neo-adjuvant chemoradiation for advanced rectal cancer. He was admitted for planned abdominal perineal resection and right hemicolectomy with permanent colostomy formation.  The patient was taken the operating room the day of admission at which time he underwent a combined procedure with a right hemicolectomy as well as abdominal perineal resection. Estimate blood loss was 400 mL. Intraoperative course was unremarkable  He was managed with an epidural for pain for the first 48 hours. The patient has been very cooperative with ambulation and incentive spirometer use. Early nausea and vomiting resolved and at this time he is tolerating a carbohydrate restricted diet well. Stoma is functioning well. Pulmonary exam is unremarkable. The perineum is showing a little bit of soft desquamation from the previous radiation, but the suture line is intact. Blake drain placed into the pelvis continues to drain approximately 300 mL of serous-serosanguineous fluid daily and were remain in place until drainage following this down.  Real pulmonary examination: Clear breath sounds, normal cardiac rhythm.  Abdomen: Soft, nondistended, nontender.  Midline wound: Healing well.  Colostomy: Healthy, functioning well.  Perineum, as noted above.   Consults: General medicine  Significant Diagnostic Studies: White blood cell count and hemoglobin are normal. Anal function stable. Potassium replenished. Blood sugars running 150-250. On sliding scale as well as evening Lantus.  Pathology showed clear  margins, negative nodes and residual tumor in the rectum. Right colon showed numerous polyps with only high-grade dysplasia, no malignancy.  Treatments:  In sinus primary daily.  Discharge Exam: Blood pressure 148/61, pulse 91, temperature 97.7 F (36.5 C), temperature source Oral, resp. rate 17, height 6' (1.829 m), weight 192 lb 4.8 oz (87.227 kg), SpO2 96 %.     Disposition: 01-Home or Self Care  Discharge Instructions    Diet Carb Modified    Complete by:  As directed   2000 calorie ADA     Discharge instructions    Complete by:  As directed   Use incentive spirometer frequently throughout the day.     Discharge wound care:    Complete by:  As directed   Absorbing pad to perineal wound. Change daily and as needed.  Blake drain to closed suction. Record drainage every 12 hours.  May shower at any time. The [place] drain should be supported during this time.  Routine colostomy care.     Increase activity slowly    Complete by:  As directed             Medication List    STOP taking these medications        LANTUS SOLOSTAR 100 UNIT/ML Solostar Pen  Generic drug:  Insulin Glargine  Replaced by:  insulin glargine 100 UNIT/ML injection     polyethylene glycol powder powder  Commonly known as:  MIRALAX      TAKE these medications        acetaminophen 325 MG tablet  Commonly known as:  TYLENOL  Take 2 tablets (650 mg total) by mouth every 6 (six) hours as needed for mild pain (or temp > 100).     INSULIN ASPART [place]  Inject 20 Units into the skin daily with lunch.  insulin aspart 100 UNIT/ML injection  Commonly known as:  novoLOG  Inject 0-15 Units into the skin 3 (three) times daily with meals.     insulin glargine 100 UNIT/ML injection  Commonly known as:  LANTUS  Inject 0.2 mLs (20 Units total) into the skin at bedtime.     lisinopril 5 MG tablet  Commonly known as:  PRINIVIL,ZESTRIL  Take 1 tablet (5 mg total) by mouth daily.     loperamide 2 MG tablet   Commonly known as:  IMODIUM A-D  Take 2 mg by mouth 4 (four) times daily as needed for diarrhea or loose stools.     meloxicam 7.5 MG tablet  Commonly known as:  MOBIC  Take 1 tablet (7.5 mg total) by mouth daily.     menthol-cetylpyridinium 3 MG lozenge  Commonly known as:  CEPACOL  Take 1 lozenge (3 mg total) by mouth as needed for sore throat.     oxyCODONE 5 MG immediate release tablet  Commonly known as:  Oxy IR/ROXICODONE  Take 1 tablet (5 mg total) by mouth every 4 (four) hours as needed for moderate pain.     polycarbophil 625 MG tablet  Commonly known as:  FIBERCON  Take 2 tablets (1,250 mg total) by mouth 2 (two) times daily.     potassium chloride SA 20 MEQ tablet  Commonly known as:  KLOR-CON M20  Take 1 tablet (20 mEq total) by mouth daily.     temazepam 7.5 MG capsule  Commonly known as:  RESTORIL  Take 1 capsule (7.5 mg total) by mouth at bedtime as needed for sleep.         Signed: Robert Bellow 07/27/2015, 8:23 AM

## 2015-07-27 NOTE — Consult Note (Signed)
WOC ostomy follow up Stoma type/location: LUQ Colostomy Stomal assessment/size: 1 1/4" round Peristomal assessment: Barrier ring and convex pouch Treatment options for stomal/peristomal skin: barrier ring Output Liquid brown stool Ostomy pouching: 1pc.convex pouch with barrier ring.  Yesterday's pouch is still intact and patient is encouraged by this.  Understands rationale for convexity and barrier ring.  5 pouches and rings to be sent with patient to rehab.  Education will be ongoing.  Education provided: see above Enrolled patient in [employer] Discharge program: Yes

## 2015-07-28 ENCOUNTER — Telehealth: Payer: Self-pay | Admitting: *Deleted

## 2015-07-28 NOTE — Telephone Encounter (Signed)
-----   Message from Loa Socks sent at 07/28/2015  1:53 PM EDT ----- Juluis Rainier called pt w 10/12 lab/md/tx appt and pt cx due to being in rehab.  He will call back to r/s.

## 2015-07-28 NOTE — Telephone Encounter (Signed)
Scheduler called patient with chemotherapy appointment. Pt stated he is unable to keep apointment for lab md/Folfox tx on 08/09/15.  He will call back to r/s once he is out of rehab.

## 2015-08-01 ENCOUNTER — Encounter: Payer: Self-pay | Admitting: General Surgery

## 2015-08-01 NOTE — Progress Notes (Signed)
Patient ID: Gary Hampton, male   DOB: 01-28-1941, 74 y.o.   MRN: [id number]  No chief complaint on file.   HPI Gary Hampton is a 74 y.o. male.  The patient was seen at [facility] on Monday, 07/31/2015, where he is in the SNF division status post APR/right colectomy completed on 07/21/2015. The patient is doing very well. He is doing all of his own colostomy care. His main complaint at this time his discomfort in the perineum. He reports that the drainage volume has been about one quarter of the bulb every other day. This is not being recorded by the nursing staff. HPI  Past Medical History  Diagnosis Date  . Diabetes mellitus without complication   . Colon polyp   . Hard of hearing   . Cancer     Rectal    Past Surgical History  Procedure Laterality Date  . Colonoscopy  2013    Dr. Dionne Milo  . Cataract extraction Right   . No past surgeries    . Portacath placement Right 03/09/2015    Procedure: INSERTION PORT-A-CATH;  Surgeon: Robert Bellow, MD;  Location: [facility] ORS;  Service: General;  Laterality: Right;  . Flexible sigmoidoscopy N/A 03/09/2015    Procedure: FLEXIBLE SIGMOIDOSCOPY/rectal biopsy;  Surgeon: Robert Bellow, MD;  Location: [facility] ORS;  Service: General;  Laterality: N/A;  . Colonoscopy N/A 03/15/2015    Procedure: COLONOSCOPY;  Surgeon: Robert Bellow, MD;  Location: [facility] ENDOSCOPY;  Service: Endoscopy;  Laterality: N/A;  . Abdominal perineal bowel resection N/A 07/21/2015    Procedure: Removal of the rectum, anus, and right colon; formation of permanent colostomy;  Surgeon: Robert Bellow, MD;  Location: [facility] ORS;  Service: General;  Laterality: N/A;    No family history on file.  Social History Social History  Substance Use Topics  . Smoking status: Former Smoker -- 1.00 packs/day for 3 years    Types: Cigarettes, Pipe, Cigars    Quit date: 10/28/1978  . Smokeless tobacco: Never Used  . Alcohol Use: 0.0 oz/week    0 Standard drinks or  equivalent per week     Comment: 3/day    No Known Allergies  Current Outpatient Prescriptions  Medication Sig Dispense Refill  . acetaminophen (TYLENOL) 325 MG tablet Take 2 tablets (650 mg total) by mouth every 6 (six) hours as needed for mild pain (or temp > 100). 100 tablet 12  . insulin aspart (NOVOLOG) 100 UNIT/ML injection Inject 0-15 Units into the skin 3 (three) times daily with meals. 10 mL 11  . insulin glargine (LANTUS) 100 UNIT/ML injection Inject 0.2 mLs (20 Units total) into the skin at bedtime. 10 mL 11  . lisinopril (PRINIVIL,ZESTRIL) 5 MG tablet Take 1 tablet (5 mg total) by mouth daily. 30 tablet 1  . loperamide (IMODIUM A-D) 2 MG tablet Take 2 mg by mouth 4 (four) times daily as needed for diarrhea or loose stools.    . meloxicam (MOBIC) 7.5 MG tablet Take 1 tablet (7.5 mg total) by mouth daily. 30 tablet 0  . menthol-cetylpyridinium (CEPACOL) 3 MG lozenge Take 1 lozenge (3 mg total) by mouth as needed for sore throat. 100 tablet 12  . oxyCODONE (OXY IR/ROXICODONE) 5 MG immediate release tablet Take 1 tablet (5 mg total) by mouth every 4 (four) hours as needed for moderate pain. 30 tablet 0  . polycarbophil (FIBERCON) 625 MG tablet Take 2 tablets (1,250 mg total) by mouth 2 (two) times daily. 120 tablet 12  .  potassium chloride SA (KLOR-CON M20) 20 MEQ tablet Take 1 tablet (20 mEq total) by mouth daily. 30 tablet 1  . temazepam (RESTORIL) 7.5 MG capsule Take 1 capsule (7.5 mg total) by mouth at bedtime as needed for sleep. 30 capsule 0   No current facility-administered medications for this visit.    Review of Systems Review of Systems  There were no vitals taken for this visit.  Physical Exam Physical Exam  Constitutional: He appears well-developed and well-nourished.  Cardiovascular: Normal rate and regular rhythm.   Pulmonary/Chest: Effort normal and breath sounds normal.  Abdominal: Soft. Bowel sounds are normal.    Genitourinary:       Data  Reviewed Apical orders had included a 20 unit dose of insulin at noontime. This was incorrect and was discontinued with the nurse.  Spoke with physical therapy. Patient is meeting goals. Anticipate discharge within a week to home.  Assessment    Doing well status post APR/right colectomy.  Blood sugar 140-200.  Blood pressure running 920 systolic on lisinopril.  Minimal pain.    Plan    We reviewed activity restrictions after he returns home. No driving until pain free. No lifting over 10+ or minus pounds.  PT will call one patient has met all discharge goals.       Robert Bellow 08/01/2015, 10:08 AM

## 2015-08-09 ENCOUNTER — Telehealth: Payer: Self-pay | Admitting: *Deleted

## 2015-08-09 ENCOUNTER — Encounter: Payer: Self-pay | Admitting: *Deleted

## 2015-08-09 ENCOUNTER — Ambulatory Visit: Payer: Medicare Other

## 2015-08-09 ENCOUNTER — Other Ambulatory Visit: Payer: Medicare Other

## 2015-08-09 ENCOUNTER — Ambulatory Visit: Payer: Medicare Other | Admitting: Internal Medicine

## 2015-08-09 NOTE — Telephone Encounter (Signed)
Message for patient to call the office.   We need to see how patient is getting along after surgery. Patient needs an appointment for follow up with Dr. Bary Castilla.

## 2015-08-09 NOTE — Telephone Encounter (Signed)
Patient called the office back to report he is moving slowly since surgery.   An appointment was made for patient to see Dr. Bary Castilla for follow up in November.

## 2015-08-17 ENCOUNTER — Telehealth: Payer: Self-pay

## 2015-08-17 NOTE — Telephone Encounter (Signed)
  Oncology Nurse Navigator Documentation    Navigator Encounter Type: Telephone (08/17/15 1600)                      Time Spent with Patient: 15 (08/17/15 1600)   Gary Hampton called and left word that he is not coming to his appt 10/21. Called and left him a voicemail to please return my call regarding him cancelling his appt.

## 2015-08-18 ENCOUNTER — Inpatient Hospital Stay: Payer: Medicare Other | Admitting: Internal Medicine

## 2015-08-18 ENCOUNTER — Inpatient Hospital Stay: Payer: Medicare Other | Attending: Internal Medicine | Admitting: Internal Medicine

## 2015-08-18 VITALS — BP 128/79 | HR 103 | Temp 97.2°F | Wt 163.6 lb

## 2015-08-18 DIAGNOSIS — C20 Malignant neoplasm of rectum: Secondary | ICD-10-CM | POA: Diagnosis not present

## 2015-08-18 DIAGNOSIS — Z923 Personal history of irradiation: Secondary | ICD-10-CM

## 2015-08-18 DIAGNOSIS — Z9221 Personal history of antineoplastic chemotherapy: Secondary | ICD-10-CM | POA: Insufficient documentation

## 2015-08-18 DIAGNOSIS — H9190 Unspecified hearing loss, unspecified ear: Secondary | ICD-10-CM | POA: Diagnosis not present

## 2015-08-18 DIAGNOSIS — Z933 Colostomy status: Secondary | ICD-10-CM | POA: Insufficient documentation

## 2015-08-18 DIAGNOSIS — Z79899 Other long term (current) drug therapy: Secondary | ICD-10-CM | POA: Diagnosis not present

## 2015-08-18 DIAGNOSIS — E119 Type 2 diabetes mellitus without complications: Secondary | ICD-10-CM | POA: Diagnosis not present

## 2015-08-18 DIAGNOSIS — Z794 Long term (current) use of insulin: Secondary | ICD-10-CM | POA: Diagnosis not present

## 2015-08-18 DIAGNOSIS — Z87891 Personal history of nicotine dependence: Secondary | ICD-10-CM | POA: Insufficient documentation

## 2015-08-18 MED ORDER — OXYCODONE HCL 5 MG PO TABS: 5.0000 mg | ORAL_TABLET | ORAL | Status: DC | PRN

## 2015-08-18 MED ORDER — MORPHINE SULFATE ER 30 MG PO TBCR: 30.0000 mg | EXTENDED_RELEASE_TABLET | Freq: Two times a day (BID) | ORAL | Status: DC

## 2015-08-18 NOTE — Progress Notes (Signed)
  Oncology Nurse Navigator Documentation    Navigator Encounter Type: Other (colostomy/post surgery) (08/18/15 1500) Patient Visit Type: Follow-up (08/18/15 1500)   Barriers/Navigation Needs:  (Does not have supplies needed at home for colostomy) (08/18/15 1500)              Specialty Items/DME: [facility] supplies (08/18/15 1500) Time Spent with Patient: 60 (08/18/15 1500)   Received call from Mr Berns stating he had no colostomy supplies and home health had to order his. His bag is being held on by ace bandage and has not been changed in days. He says he is at Dr Keith Rake office because he thought he had an appt there but they are closed.He asked if I could help with his ostomy. Told him to come to cancer center. 3 staples still in place with dried stool on abdomen. Colostomy functioning. Bag full of brown stool. Stoma moist and dark pink. Cleaned skin of dried stool. Wafer cut to 1 1/4". Skin dried, wafer and new bag applied. Told patient he had appt today with [place] and that he cancelled it and that maybe it was here and not Dr [place] office he should be at.  He said it could be. Asked Dr Yevette Edwards if he could still see him as he is a difficult patient to get into the office for appt. Mr Murley checked in and moved to exam room. Provided him with 5 wafers and 10 bags until his come in.

## 2015-08-18 NOTE — Progress Notes (Signed)
[facility] OFFICE PROGRESS NOTE  Patient Care Team: Albina Billet, MD as PCP - General (Internal Medicine) Albina Billet, MD (Internal Medicine) Robert Bellow, MD (General Surgery) Clent Jacks, RN as Registered Nurse   SUMMARY OF ONCOLOGIC HISTORY:  # MAY 2016- RECTAL CANCER- STAGE III- s/p neo-adj Chemo-RT; SEP 23rd, 2016 s/p APR [Dr.Byrnett]; ypT3 ypN0; FOLFOX on HOLD  # DM/   INTERVAL HISTORY:  A pleasant but unfortunate 74 year old male patient with above history of rectal cancer stage III status post neoadjuvant chemoradiation therapy status post APR is here for follow-up.  Post surgery he had to go to a rehabilitation/nursing home. He is currently living at home by himself. He admits to no friends or family to take care of him while he is recuperating.  Patient had multiple appointments made in the clinic; which were not followed up because of patient's reluctance. He is currently in a wheelchair. In significant pain in the rectal area.  In fact in the clinic his ostomy bag blew off; which was fixed the clinic.   REVIEW OF SYSTEMS:  A complete 10 point review of system is done which is negative except mentioned above/history of present illness.   PAST MEDICAL HISTORY :  Past Medical History  Diagnosis Date  . Diabetes mellitus without complication   . Colon polyp   . Hard of hearing   . Cancer     Rectal    PAST SURGICAL HISTORY :   Past Surgical History  Procedure Laterality Date  . Colonoscopy  2013    Dr. Dionne Milo  . Cataract extraction Right   . No past surgeries    . Portacath placement Right 03/09/2015    Procedure: INSERTION PORT-A-CATH;  Surgeon: Robert Bellow, MD;  Location: [facility] ORS;  Service: General;  Laterality: Right;  . Flexible sigmoidoscopy N/A 03/09/2015    Procedure: FLEXIBLE SIGMOIDOSCOPY/rectal biopsy;  Surgeon: Robert Bellow, MD;  Location: [facility] ORS;  Service: General;  Laterality: N/A;  . Colonoscopy N/A  03/15/2015    Procedure: COLONOSCOPY;  Surgeon: Robert Bellow, MD;  Location: [facility] ENDOSCOPY;  Service: Endoscopy;  Laterality: N/A;  . Abdominal perineal bowel resection N/A 07/21/2015    Procedure: Removal of the rectum, anus, and right colon; formation of permanent colostomy;  Surgeon: Robert Bellow, MD;  Location: [facility] ORS;  Service: General;  Laterality: N/A;    FAMILY HISTORY :  No family history on file.  SOCIAL HISTORY:   Social History  Substance Use Topics  . Smoking status: Former Smoker -- 1.00 packs/day for 3 years    Types: Cigarettes, Pipe, Cigars    Quit date: 10/28/1978  . Smokeless tobacco: Never Used  . Alcohol Use: 0.0 oz/week    0 Standard drinks or equivalent per week     Comment: 3/day    ALLERGIES:  has No Known Allergies.  MEDICATIONS:  Current Outpatient Prescriptions  Medication Sig Dispense Refill  . acetaminophen (TYLENOL) 325 MG tablet Take 2 tablets (650 mg total) by mouth every 6 (six) hours as needed for mild pain (or temp > 100). 100 tablet 12  . insulin aspart (NOVOLOG) 100 UNIT/ML injection Inject 0-15 Units into the skin 3 (three) times daily with meals. 10 mL 11  . insulin glargine (LANTUS) 100 UNIT/ML injection Inject 0.2 mLs (20 Units total) into the skin at bedtime. 10 mL 11  . lisinopril (PRINIVIL,ZESTRIL) 5 MG tablet Take 1 tablet (5 mg total) by mouth daily. [place]  tablet 1  . loperamide (IMODIUM A-D) 2 MG tablet Take 2 mg by mouth 4 (four) times daily as needed for diarrhea or loose stools.    . meloxicam (MOBIC) 7.5 MG tablet Take 1 tablet (7.5 mg total) by mouth daily. 30 tablet 0  . menthol-cetylpyridinium (CEPACOL) 3 MG lozenge Take 1 lozenge (3 mg total) by mouth as needed for sore throat. 100 tablet 12  . oxyCODONE (OXY IR/ROXICODONE) 5 MG immediate release tablet Take 1 tablet (5 mg total) by mouth every 4 (four) hours as needed for moderate pain. 30 tablet 0  . polycarbophil (FIBERCON) 625 MG tablet Take 2 tablets (1,250 mg  total) by mouth 2 (two) times daily. 120 tablet 12  . potassium chloride SA (KLOR-CON M20) 20 MEQ tablet Take 1 tablet (20 mEq total) by mouth daily. 30 tablet 1  . temazepam (RESTORIL) 7.5 MG capsule Take 1 capsule (7.5 mg total) by mouth at bedtime as needed for sleep. 30 capsule 0  . loperamide (IMODIUM) 2 MG capsule Take 2 mg by mouth.    Marland Kitchen NOVOLOG FLEXPEN 100 UNIT/ML FlexPen     . polyethylene glycol powder (GLYCOLAX/MIRALAX) powder      No current facility-administered medications for this visit.    PHYSICAL EXAMINATION: ECOG PERFORMANCE STATUS: 3 - Symptomatic, >50% confined to bed  BP 128/79 mmHg  Pulse 103  Temp(Src) 97.2 F (36.2 C) (Tympanic)  Wt 163 lb 9.3 oz (74.2 kg)  Filed Weights   08/18/15 1523  Weight: 163 lb 9.3 oz (74.2 kg)    GENERAL: Poorly nourished moderately built  Alone. He is in a wheelchair; mild to moderate pain from his rectal area. He cannot stand up for a detailed exam. EYES: no pallor or icterus OROPHARYNX:poor dentition. LYMPH:  no palpable lymphadenopathy in the cervical, axillary.  LUNGS: .No wheeze or crackles HEART/CVS: regular rate & rhythm and no murmurs; No lower extremity edema ABDOMEN:abdomen soft, non-tender and normal bowel sounds; ostomy bag- had come off; needed to be cleaned up. Musculoskeletal:no cyanosis of digits and no clubbing  PSYCH: alert & oriented x 3 with fluent speech NEURO: no focal motor/sensory deficits SKIN:  no rashes or significant lesions  LABORATORY DATA:  I have reviewed the data as listed    Component Value Date/Time   NA 131* 07/26/2015 0523   K 3.7 07/26/2015 0523   CL 92* 07/26/2015 0523   CO2 27 07/26/2015 0523   GLUCOSE 239* 07/26/2015 0523   BUN 17 07/26/2015 0523   CREATININE 0.77 07/26/2015 0523   CALCIUM 7.6* 07/26/2015 0523   PROT 6.5 06/12/2015 1346   ALBUMIN 3.7 06/12/2015 1346   AST 17 06/12/2015 1346   ALT 13* 06/12/2015 1346   ALKPHOS 50 06/12/2015 1346   BILITOT 0.8 06/12/2015  1346   GFRNONAA >60 07/26/2015 0523   GFRAA >60 07/26/2015 0523    No results found for: SPEP, UPEP  Lab Results  Component Value Date   WBC 9.3 07/27/2015   NEUTROABS 7.3* 07/27/2015   HGB 10.7* 07/27/2015   HCT 30.5* 07/27/2015   MCV 93.1 07/27/2015   PLT 234 07/27/2015      Chemistry      Component Value Date/Time   NA 131* 07/26/2015 0523   K 3.7 07/26/2015 0523   CL 92* 07/26/2015 0523   CO2 27 07/26/2015 0523   BUN 17 07/26/2015 0523   CREATININE 0.77 07/26/2015 0523      Component Value Date/Time   CALCIUM 7.6* 07/26/2015 6767  ALKPHOS 50 06/12/2015 1346   AST 17 06/12/2015 1346   ALT 13* 06/12/2015 1346   BILITOT 0.8 06/12/2015 1346       RADIOGRAPHIC STUDIES: I have personally reviewed the radiological images as listed and agreed with the findings in the report. No results found.   ASSESSMENT & PLAN:   # Rectal cancer stage III status post chemoradiation therapy post APR ypT3 ypN0. Patient has not started on adjuvant chemotherapy yet as planned because of recovery in the rehabilitation/poor social support/patient's reluctance.   Patient understands that he needs adjuvant chemotherapy for his rectal cancer. He is agreeable for follow-up in 4 weeks  # Pain in the perineal status post APR; likely postsurgical. We'll start the patient on MS Contin 30 mg twice a day; continue oxycodone 5 mg as needed. New prescription given.   Patient follow-up with me in approximately 4 weeks; CBC CMP; CEA. Unfortunately, overall the prognosis is poor.  No orders of the defined types were placed in this encounter.   All questions were answered. The patient knows to call the clinic with any problems, questions or concerns. No barriers to learning was detected. I spent 25 minutes counseling the patient face to face. The total time spent in the appointment was 30 minutes and more than 50% was on counseling and review of test results     Cammie Sickle, MD 08/18/2015  3:41 PM

## 2015-08-18 NOTE — Progress Notes (Signed)
Patient states his backside is sore.  Requesting refill for Oxycodone but asking if mg can be increased.  It works for him but takes longer to work.

## 2015-08-28 ENCOUNTER — Telehealth: Payer: Self-pay | Admitting: *Deleted

## 2015-08-28 NOTE — Telephone Encounter (Signed)
Left message for patient to call the office back. He is scheduled to come see Dr.Byrnett 09/07/15, per Dr.Byrnett for patient to come in on Wednesday 08/30/15

## 2015-09-07 ENCOUNTER — Ambulatory Visit (INDEPENDENT_AMBULATORY_CARE_PROVIDER_SITE_OTHER): Payer: Medicare Other | Admitting: General Surgery

## 2015-09-07 ENCOUNTER — Encounter: Payer: Self-pay | Admitting: General Surgery

## 2015-09-07 VITALS — BP 108/52 | HR 68 | Resp 16 | Ht 68.0 in | Wt 158.0 lb

## 2015-09-07 DIAGNOSIS — E46 Unspecified protein-calorie malnutrition: Secondary | ICD-10-CM

## 2015-09-07 DIAGNOSIS — E119 Type 2 diabetes mellitus without complications: Secondary | ICD-10-CM | POA: Diagnosis not present

## 2015-09-07 DIAGNOSIS — C20 Malignant neoplasm of rectum: Secondary | ICD-10-CM

## 2015-09-07 DIAGNOSIS — T8131XA Disruption of external operation (surgical) wound, not elsewhere classified, initial encounter: Secondary | ICD-10-CM

## 2015-09-07 LAB — GLUCOSE, POCT (MANUAL RESULT ENTRY): POC GLUCOSE: 230 mg/dL — AB (ref 70–99)

## 2015-09-07 NOTE — Progress Notes (Signed)
Patient ID: Gary Hampton, male   DOB: September 12, 1941, 74 y.o.   MRN: [id number]  Chief Complaint  Patient presents with  . Routine Post Op    coloectomy    HPI Gary Hampton is a 74 y.o. male herer today following up from a colectomy done on 07/21/15. He states he feel very weak.  The patient was seen 5 weeks ago when he was still at [facility]. At that time he was doing well and his perineal drain was removed without incident.  The patient was subsequently discharged from the SNF facility without arrangements for outpatient surgery: Follow-up. He has been seen by medical oncology once. At that visit he was placed on OxyContin for complaints of perineal pain.  The patient reports poor appetite, and at the time of his examination today midafternoon had had one can of Ensure.  He is continuing taking 20 units of insulin daily, but does not check his blood sugars as he reports he does not have a meter. He reports his appetite is down, and this correlates with a 30 pound weight loss since his preop visit.  He's had some difficulty with appliance fitting post colostomy, or chili he is having formed stools so on the bag leaks it is not a catastrophic event. He does report significant weakness. HPI  Past Medical History  Diagnosis Date  . Diabetes mellitus without complication ([place])   . Colon polyp   . Hard of hearing   . Cancer [facility])     Rectal    Past Surgical History  Procedure Laterality Date  . Colonoscopy  2013    Dr. Dionne Milo  . Cataract extraction Right   . No past surgeries    . Portacath placement Right 03/09/2015    Procedure: INSERTION PORT-A-CATH;  Surgeon: Robert Bellow, MD;  Location: [facility] ORS;  Service: General;  Laterality: Right;  . Flexible sigmoidoscopy N/A 03/09/2015    Procedure: FLEXIBLE SIGMOIDOSCOPY/rectal biopsy;  Surgeon: Robert Bellow, MD;  Location: [facility] ORS;  Service: General;  Laterality: N/A;  . Colonoscopy N/A 03/15/2015    Procedure:  COLONOSCOPY;  Surgeon: Robert Bellow, MD;  Location: [facility] ENDOSCOPY;  Service: Endoscopy;  Laterality: N/A;  . Abdominal perineal bowel resection N/A 07/21/2015    Procedure: Removal of the rectum, anus, and right colon; formation of permanent colostomy;  Surgeon: Robert Bellow, MD;  Location: [facility] ORS;  Service: General;  Laterality: N/A;    No family history on file.  Social History Social History  Substance Use Topics  . Smoking status: Former Smoker -- 1.00 packs/day for 3 years    Types: Cigarettes, Pipe, Cigars    Quit date: 10/28/1978  . Smokeless tobacco: Never Used  . Alcohol Use: 0.0 oz/week    0 Standard drinks or equivalent per week     Comment: 3/day    No Known Allergies  Current Outpatient Prescriptions  Medication Sig Dispense Refill  . acetaminophen (TYLENOL) 325 MG tablet Take 2 tablets (650 mg total) by mouth every 6 (six) hours as needed for mild pain (or temp > 100). 100 tablet 12  . insulin aspart (NOVOLOG) 100 UNIT/ML injection Inject 0-15 Units into the skin 3 (three) times daily with meals. 10 mL 11  . insulin glargine (LANTUS) 100 UNIT/ML injection Inject 0.2 mLs (20 Units total) into the skin at bedtime. 10 mL 11  . meloxicam (MOBIC) 7.5 MG tablet Take 1 tablet (7.5 mg total) by mouth daily. 30 tablet 0  . menthol-cetylpyridinium (  CEPACOL) 3 MG lozenge Take 1 lozenge (3 mg total) by mouth as needed for sore throat. 100 tablet 12  . morphine (MS CONTIN) 30 MG 12 hr tablet Take 1 tablet (30 mg total) by mouth every 12 (twelve) hours. 60 tablet 0  . NOVOLOG FLEXPEN 100 UNIT/ML FlexPen     . oxyCODONE (OXY IR/ROXICODONE) 5 MG immediate release tablet Take 1 tablet (5 mg total) by mouth every 4 (four) hours as needed for moderate pain. 120 tablet 0  . polycarbophil (FIBERCON) 625 MG tablet Take 2 tablets (1,250 mg total) by mouth 2 (two) times daily. 120 tablet 12  . polyethylene glycol powder (GLYCOLAX/MIRALAX) powder     . potassium chloride SA  (KLOR-CON M20) 20 MEQ tablet Take 1 tablet (20 mEq total) by mouth daily. 30 tablet 1  . temazepam (RESTORIL) 7.5 MG capsule Take 1 capsule (7.5 mg total) by mouth at bedtime as needed for sleep. 30 capsule 0  . lisinopril (PRINIVIL,ZESTRIL) 5 MG tablet Take 1 tablet (5 mg total) by mouth daily. (Patient not taking: Reported on 09/07/2015) 30 tablet 1  . loperamide (IMODIUM A-D) 2 MG tablet Take 2 mg by mouth 4 (four) times daily as needed for diarrhea or loose stools.    Marland Kitchen loperamide (IMODIUM) 2 MG capsule Take 2 mg by mouth.     No current facility-administered medications for this visit.    Review of Systems Review of Systems  Constitutional: Positive for activity change, appetite change and unexpected weight change. Negative for fever, diaphoresis and fatigue.  Respiratory: Negative.     Blood pressure 108/52, pulse 68, resp. rate 16, height 5\' 8"  (1.727 m), weight 158 lb (71.668 kg).  Physical Exam Physical Exam  Constitutional: He is oriented to person, place, and time. He appears well-developed and well-nourished.  Eyes: Conjunctivae are normal. No scleral icterus.  Neck: Neck supple.  Cardiovascular: Normal rate, regular rhythm and normal heart sounds.   Pulmonary/Chest: Effort normal and breath sounds normal.  Abdominal: Soft. Bowel sounds are normal. There is no tenderness.    Genitourinary:     Lymphadenopathy:    He has no cervical adenopathy.  Neurological: He is alert and oriented to person, place, and time.  Skin: Skin is warm and dry.  Nursing note reviewed.   Data Reviewed Medical oncology notes reviewed.  Assessment    Protein calorie malnutrition interfering with wound healing.  Poor social situation.  Poorly controlled diabetes (random blood sugar 200+ today)    Plan    Based on previous discussions with the patient's primary care physician in it's unlikely return to get better glucose control. The importance of dietary intake to allow healing  to take place was reviewed.  The ostomy nurse has been contacted to arrange for appropriate delivery of appliances for the patient.  The patient is amenable to a home visit next week and this will be undertaken. If he would be amenable to Meals on Wheels this would be helpful with some of his caloric needs. If he is not eating anything else he isn't encouraged to take 6 cans of ensure daily.     PCP:  Almetta Lovely 09/08/2015, 5:19 AM

## 2015-09-08 DIAGNOSIS — T8131XA Disruption of external operation (surgical) wound, not elsewhere classified, initial encounter: Secondary | ICD-10-CM | POA: Insufficient documentation

## 2015-09-08 DIAGNOSIS — E119 Type 2 diabetes mellitus without complications: Secondary | ICD-10-CM | POA: Insufficient documentation

## 2015-09-08 DIAGNOSIS — E46 Unspecified protein-calorie malnutrition: Secondary | ICD-10-CM | POA: Insufficient documentation

## 2015-09-11 ENCOUNTER — Other Ambulatory Visit: Payer: Self-pay

## 2015-09-11 ENCOUNTER — Telehealth: Payer: Self-pay

## 2015-09-11 MED ORDER — AMOXICILLIN-POT CLAVULANATE 875-125 MG PO TABS: 1.0000 | ORAL_TABLET | Freq: Two times a day (BID) | ORAL | Status: DC

## 2015-09-11 NOTE — Telephone Encounter (Signed)
-----   Message from Robert Bellow, MD sent at 09/11/2015  1:01 PM EST ----- Please send a prescription for Augmentin 875 mg, #20 with the inscription 1 by mouth twice a day to his pharmacy. Make sure he is aware to take it twice a day.  ----- Message -----    From: Carson Myrtle, RN    Sent: 09/07/2015   3:53 PM      To: Robert Bellow, MD

## 2015-09-11 NOTE — Telephone Encounter (Signed)
Message left for patient to inform him of his prescription that has been sent into his pharmacy. He needs to know that he needs to take this twice a day.

## 2015-09-12 LAB — ANAEROBIC AND AEROBIC CULTURE

## 2015-09-13 NOTE — Telephone Encounter (Signed)
-----   Message from Robert Bellow, MD sent at 09/11/2015  1:01 PM EST ----- Please send a prescription for Augmentin 875 mg, #20 with the inscription 1 by mouth twice a day to his pharmacy. Make sure he is aware to take it twice a day.  ----- Message -----    From: Carson Myrtle, RN    Sent: 09/07/2015   3:53 PM      To: Robert Bellow, MD

## 2015-09-13 NOTE — Telephone Encounter (Signed)
Notified patient as instructed, aware to take medication twice a day.

## 2015-10-04 ENCOUNTER — Telehealth: Payer: Self-pay

## 2015-10-04 NOTE — Telephone Encounter (Signed)
Left message for the patient to call back to schedule a follow up appointment with Dr Bary Castilla either here in the office or Dr Bary Castilla could do a home visit if that is more convenient.

## 2015-10-11 ENCOUNTER — Ambulatory Visit (INDEPENDENT_AMBULATORY_CARE_PROVIDER_SITE_OTHER): Payer: Medicare Other | Admitting: General Surgery

## 2015-10-11 ENCOUNTER — Encounter: Payer: Self-pay | Admitting: General Surgery

## 2015-10-11 VITALS — BP 86/54 | HR 82 | Resp 12 | Ht 68.0 in | Wt 159.0 lb

## 2015-10-11 DIAGNOSIS — R32 Unspecified urinary incontinence: Secondary | ICD-10-CM

## 2015-10-11 DIAGNOSIS — C20 Malignant neoplasm of rectum: Secondary | ICD-10-CM

## 2015-10-11 DIAGNOSIS — T8130XA Disruption of wound, unspecified, initial encounter: Secondary | ICD-10-CM

## 2015-10-11 MED ORDER — MORPHINE SULFATE ER 15 MG PO TBCR: 15.0000 mg | EXTENDED_RELEASE_TABLET | Freq: Two times a day (BID) | ORAL | Status: DC

## 2015-10-11 MED ORDER — TAMSULOSIN HCL 0.4 MG PO CAPS: 0.4000 mg | ORAL_CAPSULE | Freq: Every day | ORAL | Status: DC

## 2015-10-11 NOTE — Patient Instructions (Signed)
The patient is aware to call back for any questions or concerns.  

## 2015-10-11 NOTE — Progress Notes (Signed)
Patient ID: Gary Hampton, male   DOB: 1941/09/29, 74 y.o.   MRN: [id number]  Chief Complaint  Patient presents with  . Follow-up    HPI Gary Hampton is a 74 y.o. male here today following up from a colectomy done on 07/21/15. He states he gets tired very easy and poor appetite. He gets in one good meal a day, he does drink 2-5 Ensure drinks a day.  He states he has lost control of his bladder in the past 2 weeks. He has less control of his urination. This is usually a small volume. He reports that sometimes he feels like he is emptying his bladder completely, other times just a few drops. Less difficulty with colostomy appliance fixation. He has noticed some "tingling" in the left groin/thigh.  His horse died somewhat unexpectedly at age 68. This had been his primary focus prior to his hospitalization, and is certainly affecting his recovery. I personally reviewed the patient's history. HPI  Past Medical History  Diagnosis Date  . Diabetes mellitus without complication ([place])   . Colon polyp   . Hard of hearing   . Cancer [facility])     Rectal    Past Surgical History  Procedure Laterality Date  . Colonoscopy  2013    Dr. Dionne Milo  . Cataract extraction Right   . No past surgeries    . Portacath placement Right 03/09/2015    Procedure: INSERTION PORT-A-CATH;  Surgeon: Robert Bellow, MD;  Location: [facility] ORS;  Service: General;  Laterality: Right;  . Flexible sigmoidoscopy N/A 03/09/2015    Procedure: FLEXIBLE SIGMOIDOSCOPY/rectal biopsy;  Surgeon: Robert Bellow, MD;  Location: [facility] ORS;  Service: General;  Laterality: N/A;  . Colonoscopy N/A 03/15/2015    Procedure: COLONOSCOPY;  Surgeon: Robert Bellow, MD;  Location: [facility] ENDOSCOPY;  Service: Endoscopy;  Laterality: N/A;  . Abdominal perineal bowel resection N/A 07/21/2015    Procedure: Removal of the rectum, anus, and right colon; formation of permanent colostomy;  Surgeon: Robert Bellow, MD;  Location: [facility] ORS;   Service: General;  Laterality: N/A;    No family history on file.  Social History Social History  Substance Use Topics  . Smoking status: Former Smoker -- 1.00 packs/day for 3 years    Types: Cigarettes, Pipe, Cigars    Quit date: 10/28/1978  . Smokeless tobacco: Never Used  . Alcohol Use: 0.0 oz/week    0 Standard drinks or equivalent per week     Comment: 3/day    No Known Allergies  Current Outpatient Prescriptions  Medication Sig Dispense Refill  . acetaminophen (TYLENOL) 325 MG tablet Take 2 tablets (650 mg total) by mouth every 6 (six) hours as needed for mild pain (or temp > 100). 100 tablet 12  . insulin glargine (LANTUS) 100 UNIT/ML injection Inject 0.2 mLs (20 Units total) into the skin at bedtime. (Patient taking differently: Inject 20 Units into the skin daily. ) 10 mL 11  . lisinopril (PRINIVIL,ZESTRIL) 5 MG tablet Take 1 tablet (5 mg total) by mouth daily. 30 tablet 1  . meloxicam (MOBIC) 7.5 MG tablet Take 1 tablet (7.5 mg total) by mouth daily. 30 tablet 0  . menthol-cetylpyridinium (CEPACOL) 3 MG lozenge Take 1 lozenge (3 mg total) by mouth as needed for sore throat. 100 tablet 12  . morphine (MS CONTIN) 30 MG 12 hr tablet Take 1 tablet (30 mg total) by mouth every 12 (twelve) hours. 60 tablet 0  . oxyCODONE (OXY IR/ROXICODONE) 5 MG  immediate release tablet Take 1 tablet (5 mg total) by mouth every 4 (four) hours as needed for moderate pain. 120 tablet 0  . potassium chloride SA (KLOR-CON M20) 20 MEQ tablet Take 1 tablet (20 mEq total) by mouth daily. 30 tablet 1  . temazepam (RESTORIL) 7.5 MG capsule Take 1 capsule (7.5 mg total) by mouth at bedtime as needed for sleep. 30 capsule 0  . morphine (MS CONTIN) 15 MG 12 hr tablet Take 1 tablet (15 mg total) by mouth every 12 (twelve) hours. 60 tablet 0  . tamsulosin (FLOMAX) 0.4 MG CAPS capsule Take 1 capsule (0.4 mg total) by mouth daily after supper. 30 capsule 11   No current facility-administered medications for this  visit.    Review of Systems Review of Systems  Constitutional: Positive for appetite change and fatigue.  Respiratory: Negative.   Cardiovascular: Negative.   Genitourinary: Positive for difficulty urinating.  Neurological: Positive for weakness and light-headedness.    Blood pressure 86/54, pulse 82, resp. rate 12, height 5\' 8"  (1.727 m), weight 159 lb (72.122 kg), SpO2 88 %. The patient denies any shortness of breath. Weight is up 1 pound from his last visit. Physical Exam Physical Exam  Constitutional: He is oriented to person, place, and time. He appears well-developed.  HENT:  Mouth/Throat: Oropharynx is clear and moist.  Eyes: Conjunctivae are normal. No scleral icterus.  Neck: Neck supple.  Cardiovascular: Normal rate, regular rhythm and normal heart sounds.   Pulses:      Femoral pulses are 2+ on the right side, and 2+ on the left side.      Popliteal pulses are 2+ on the right side, and 2+ on the left side.       Dorsalis pedis pulses are 2+ on the right side, and 2+ on the left side.       Posterior tibial pulses are 2+ on the right side, and 2+ on the left side.  Pulmonary/Chest: Effort normal and breath sounds normal.  Abdominal:  Functioning ostomy present  Genitourinary:     Rectal wound is smaller.  Lymphadenopathy:    He has no cervical adenopathy.  Neurological: He is alert and oriented to person, place, and time.  Skin: Skin is warm and dry.  Psychiatric: His behavior is normal.       Assessment    Some improvement in weight, still significantly below preop level.  Pain in the perineum requiring less constant narcotic use. Patient was amenable to receiving MS Contin 15 mg. #60 supplied. No refills.    Plan    Laboratory studies will be obtained today. The patient was again offered to have home visits, but declined. We'll likely plan for follow-up examination in one month.  The patient may be experiencing overflow incontinence from urinary  retention. He was offered urology referral and declined. We'll place him on Flomax 0.4 mg daily and see if this improves his symptoms.       PCP:  Almetta Lovely 10/13/2015, 7:23 AM

## 2015-10-12 LAB — BASIC METABOLIC PANEL
BUN / CREAT RATIO: 9 — AB (ref 10–22)
BUN: 9 mg/dL (ref 8–27)
CHLORIDE: 99 mmol/L (ref 96–106)
CO2: 28 mmol/L (ref 18–29)
Calcium: 9.2 mg/dL (ref 8.6–10.2)
Creatinine, Ser: 0.95 mg/dL (ref 0.76–1.27)
GFR calc Af Amer: 90 mL/min/{1.73_m2} (ref >59)
GFR calc non Af Amer: 78 mL/min/{1.73_m2} (ref >59)
GLUCOSE: 49 mg/dL — AB (ref 65–99)
POTASSIUM: 4.1 mmol/L (ref 3.5–5.2)
SODIUM: 142 mmol/L (ref 134–144)

## 2015-10-12 LAB — CBC WITH DIFFERENTIAL/PLATELET
Basophils Absolute: 0 10*3/uL (ref 0.0–0.2)
Basos: 0 %
EOS (ABSOLUTE): 0.3 10*3/uL (ref 0.0–0.4)
Eos: 3 %
Hematocrit: 37.4 % — ABNORMAL LOW (ref 37.5–51.0)
Hemoglobin: 12.2 g/dL — ABNORMAL LOW (ref 12.6–17.7)
Immature Grans (Abs): 0 10*3/uL (ref 0.0–0.1)
Immature Granulocytes: 0 %
Lymphocytes Absolute: 2.4 10*3/uL (ref 0.7–3.1)
Lymphs: 27 %
MCH: 28.7 pg (ref 26.6–33.0)
MCHC: 32.6 g/dL (ref 31.5–35.7)
MCV: 88 fL (ref 79–97)
Monocytes Absolute: 0.6 10*3/uL (ref 0.1–0.9)
Monocytes: 7 %
Neutrophils Absolute: 5.6 10*3/uL (ref 1.4–7.0)
Neutrophils: 63 %
Platelets: 333 10*3/uL (ref 150–379)
RBC: 4.25 x10E6/uL (ref 4.14–5.80)
RDW: 15.5 % — ABNORMAL HIGH (ref 12.3–15.4)
WBC: 8.9 10*3/uL (ref 3.4–10.8)

## 2015-10-12 LAB — CEA: CEA: 4.9 ng/mL — AB (ref 0.0–4.7)

## 2015-10-13 DIAGNOSIS — T8130XA Disruption of wound, unspecified, initial encounter: Secondary | ICD-10-CM | POA: Insufficient documentation

## 2015-10-17 ENCOUNTER — Ambulatory Visit: Payer: Medicare Other | Admitting: General Surgery

## 2015-10-26 ENCOUNTER — Other Ambulatory Visit: Payer: Self-pay | Admitting: Nurse Practitioner

## 2015-11-01 ENCOUNTER — Telehealth: Payer: Self-pay

## 2015-11-01 NOTE — Telephone Encounter (Signed)
  Oncology Nurse Navigator Documentation    Navigator Encounter Type: Telephone (11/01/15 1500) Telephone: Outgoing Call (11/01/15 1500)                                        Time Spent with Patient: 15 (11/01/15 1500)   Voicemail reminder left for appt with Dr Chrystal/Brahmanday on 11/15/15

## 2015-11-14 ENCOUNTER — Ambulatory Visit (INDEPENDENT_AMBULATORY_CARE_PROVIDER_SITE_OTHER): Payer: Medicare Other | Admitting: General Surgery

## 2015-11-14 ENCOUNTER — Encounter: Payer: Self-pay | Admitting: General Surgery

## 2015-11-14 VITALS — BP 110/60 | HR 68 | Resp 16 | Ht 68.0 in | Wt 169.0 lb

## 2015-11-14 DIAGNOSIS — C20 Malignant neoplasm of rectum: Secondary | ICD-10-CM

## 2015-11-14 DIAGNOSIS — R32 Unspecified urinary incontinence: Secondary | ICD-10-CM

## 2015-11-14 NOTE — Patient Instructions (Signed)
Return in three months.

## 2015-11-14 NOTE — Progress Notes (Signed)
Patient ID: Gary Hampton, male   DOB: 18-May-1941, 75 y.o.   MRN: [id number]  Chief Complaint  Patient presents with  . Follow-up    rectal cancer    HPI Gary Hampton is a 75 y.o. male here today for hos one month follow up rectal cancer. Patient states he is doing fair. He states he is weak  In his legs and has been eating a little better.  He gets in one good meal a day, he does drink 2-5 Ensure drinks a day. The patient reports that he makes use of ensure on days when he is not eating a more formal meal, that he is eating much better and his use of Ensure has decreased.  He is frustrated by his lack of strength, reporting that by the time he walks from the car to the back of [employer] he needs to rest. He does admit this is a significant improvement over the past month.  The patient reports continued incontinence without any objective improvement in urinary stream since initiation of Flomax on his last visit. He's most troubled by nighttime urination when he reports that the adult diaper he is wearing is fully saturated with urine.  I personally reviewed the patient's history. HPI  Past Medical History  Diagnosis Date  . Diabetes mellitus without complication ([place])   . Colon polyp   . Hard of hearing   . Cancer [facility])     Rectal    Past Surgical History  Procedure Laterality Date  . Colonoscopy  2013    Dr. Dionne Milo  . Cataract extraction Right   . No past surgeries    . Portacath placement Right 03/09/2015    Procedure: INSERTION PORT-A-CATH;  Surgeon: Robert Bellow, MD;  Location: [facility] ORS;  Service: General;  Laterality: Right;  . Flexible sigmoidoscopy N/A 03/09/2015    Procedure: FLEXIBLE SIGMOIDOSCOPY/rectal biopsy;  Surgeon: Robert Bellow, MD;  Location: [facility] ORS;  Service: General;  Laterality: N/A;  . Colonoscopy N/A 03/15/2015    Procedure: COLONOSCOPY;  Surgeon: Robert Bellow, MD;  Location: [facility] ENDOSCOPY;  Service: Endoscopy;  Laterality: N/A;  .  Abdominal perineal bowel resection N/A 07/21/2015    Procedure: Removal of the rectum, anus, and right colon; formation of permanent colostomy;  Surgeon: Robert Bellow, MD;  Location: [facility] ORS;  Service: General;  Laterality: N/A;    No family history on file.  Social History Social History  Substance Use Topics  . Smoking status: Former Smoker -- 1.00 packs/day for 3 years    Types: Cigarettes, Pipe, Cigars    Quit date: 10/28/1978  . Smokeless tobacco: Never Used  . Alcohol Use: 0.0 oz/week    0 Standard drinks or equivalent per week     Comment: 3/day    No Known Allergies  Current Outpatient Prescriptions  Medication Sig Dispense Refill  . acetaminophen (TYLENOL) 325 MG tablet Take 2 tablets (650 mg total) by mouth every 6 (six) hours as needed for mild pain (or temp > 100). 100 tablet 12  . insulin glargine (LANTUS) 100 UNIT/ML injection Inject 0.2 mLs (20 Units total) into the skin at bedtime. (Patient taking differently: Inject 20 Units into the skin daily. ) 10 mL 11  . lisinopril (PRINIVIL,ZESTRIL) 5 MG tablet Take 1 tablet (5 mg total) by mouth daily. 30 tablet 1  . meloxicam (MOBIC) 7.5 MG tablet Take 1 tablet (7.5 mg total) by mouth daily. 30 tablet 0  . menthol-cetylpyridinium (CEPACOL) 3 MG lozenge Take  1 lozenge (3 mg total) by mouth as needed for sore throat. 100 tablet 12  . morphine (MS CONTIN) 15 MG 12 hr tablet Take 1 tablet (15 mg total) by mouth every 12 (twelve) hours. 60 tablet 0  . morphine (MS CONTIN) 30 MG 12 hr tablet Take 1 tablet (30 mg total) by mouth every 12 (twelve) hours. 60 tablet 0  . oxyCODONE (OXY IR/ROXICODONE) 5 MG immediate release tablet Take 1 tablet (5 mg total) by mouth every 4 (four) hours as needed for moderate pain. 120 tablet 0  . potassium chloride SA (KLOR-CON M20) 20 MEQ tablet Take 1 tablet (20 mEq total) by mouth daily. 30 tablet 1  . tamsulosin (FLOMAX) 0.4 MG CAPS capsule Take 1 capsule (0.4 mg total) by mouth daily after  supper. 30 capsule 11  . temazepam (RESTORIL) 7.5 MG capsule Take 1 capsule (7.5 mg total) by mouth at bedtime as needed for sleep. 30 capsule 0   No current facility-administered medications for this visit.    Review of Systems Review of Systems  Constitutional: Negative.   Respiratory: Negative.   Cardiovascular: Negative.     Blood pressure 110/60, pulse 68, resp. rate 16, height 5\' 8"  (1.727 m), weight 169 lb (76.658 kg). The patient's weight is up 10 pounds from his last visit.  Physical Exam Physical Exam  Constitutional: He is oriented to person, place, and time. He appears well-developed and well-nourished.  Cardiovascular: Normal rate, regular rhythm and normal heart sounds.   Pulmonary/Chest: Effort normal and breath sounds normal.  Abdominal: Soft. Bowel sounds are normal. There is no tenderness.    Genitourinary:     Neurological: He is alert and oriented to person, place, and time.  Skin: Skin is warm and dry.   1 by 3 rectal opening    Assessment    Marked improvement intrusion all status and perineal wound healing.    Plan    The patient will continue his presence supply of Flomax.  Continued efforts at increasing his walking and exercise tolerance encouraged.  Patient to return in three months    PCP:  Benita Stabile  This information has been scribed by Gaspar Cola CMA.     Robert Bellow 11/15/2015, 9:33 PM

## 2015-11-15 ENCOUNTER — Ambulatory Visit: Payer: Medicare Other | Admitting: Internal Medicine

## 2015-11-15 ENCOUNTER — Ambulatory Visit: Payer: Medicare Other | Admitting: Radiation Oncology

## 2015-11-15 ENCOUNTER — Inpatient Hospital Stay: Admission: RE | Admit: 2015-11-15 | Payer: Medicare Other | Source: Ambulatory Visit | Admitting: Radiation Oncology

## 2016-01-18 ENCOUNTER — Ambulatory Visit (INDEPENDENT_AMBULATORY_CARE_PROVIDER_SITE_OTHER): Payer: Medicare Other | Admitting: General Surgery

## 2016-01-18 ENCOUNTER — Encounter: Payer: Self-pay | Admitting: General Surgery

## 2016-01-18 VITALS — BP 136/68 | HR 96 | Resp 18 | Ht 72.0 in | Wt 176.0 lb

## 2016-01-18 DIAGNOSIS — C20 Malignant neoplasm of rectum: Secondary | ICD-10-CM | POA: Diagnosis not present

## 2016-01-18 MED ORDER — OXYCODONE-ACETAMINOPHEN 10-650 MG PO TABS: 1.0000 | ORAL_TABLET | Freq: Every evening | ORAL | Status: DC | PRN

## 2016-01-18 NOTE — Patient Instructions (Signed)
The patient is aware to call back for any questions or concerns.  

## 2016-01-18 NOTE — Progress Notes (Signed)
Patient ID: Gary Hampton, male   DOB: 06-May-1941, 75 y.o.   MRN: [id number]  Chief Complaint  Patient presents with  . Follow-up    rectal cancer    HPI Gary Hampton is a 75 y.o. male.  Here today for follow up rectal cancer. He states he has noticed some rectal drainage in his underwear as well. He has noticed some irrigation around the stoma with bleeding for about 2 weeks. He states his appetite is much better.  He reports at night he is frequently awakened about 4-5 AM with pain in his legs. A sensation as if circulation is not complete. He is ambulating better and reports no claudication symptoms.  Average FSBS is 200   He has run out of his pain medications that he uses for lower leg pain at night..(Extended release morphine and oxycodone).  HPI  Past Medical History  Diagnosis Date  . Diabetes mellitus without complication ([place])   . Colon polyp   . Hard of hearing   . Cancer [facility])     Rectal    Past Surgical History  Procedure Laterality Date  . Colonoscopy  2013    Dr. Dionne Milo  . Cataract extraction Right   . No past surgeries    . Portacath placement Right 03/09/2015    Procedure: INSERTION PORT-A-CATH;  Surgeon: Robert Bellow, MD;  Location: [facility] ORS;  Service: General;  Laterality: Right;  . Flexible sigmoidoscopy N/A 03/09/2015    Procedure: FLEXIBLE SIGMOIDOSCOPY/rectal biopsy;  Surgeon: Robert Bellow, MD;  Location: [facility] ORS;  Service: General;  Laterality: N/A;  . Colonoscopy N/A 03/15/2015    Procedure: COLONOSCOPY;  Surgeon: Robert Bellow, MD;  Location: [facility] ENDOSCOPY;  Service: Endoscopy;  Laterality: N/A;  . Abdominal perineal bowel resection N/A 07/21/2015    Procedure: Removal of the rectum, anus, and right colon; formation of permanent colostomy;  Surgeon: Robert Bellow, MD;  Location: [facility] ORS;  Service: General;  Laterality: N/A;    No family history on file.  Social History Social History  Substance Use Topics  . Smoking  status: Former Smoker -- 1.00 packs/day for 3 years    Types: Cigarettes, Pipe, Cigars    Quit date: 10/28/1978  . Smokeless tobacco: Never Used  . Alcohol Use: 0.0 oz/week    0 Standard drinks or equivalent per week     Comment: 3/day    No Known Allergies  Current Outpatient Prescriptions  Medication Sig Dispense Refill  . acetaminophen (TYLENOL) 325 MG tablet Take 2 tablets (650 mg total) by mouth every 6 (six) hours as needed for mild pain (or temp > 100). 100 tablet 12  . NOVOLOG FLEXPEN 100 UNIT/ML FlexPen Inject 20 Units into the skin daily.     Marland Kitchen oxyCODONE-acetaminophen (PERCOCET) 10-650 MG tablet Take 1 tablet by mouth at bedtime and may repeat dose one time if needed. 120 tablet 0   No current facility-administered medications for this visit.    Review of Systems Review of Systems  Constitutional: Negative.   Respiratory: Negative.   Cardiovascular: Negative.     Blood pressure 136/68, pulse 96, resp. rate 18, height 6' (1.829 m), weight 176 lb (79.833 kg).  The patient's weight is up 15 pounds from his December 2016 exam.  Physical Exam Physical Exam  Constitutional: He is oriented to person, place, and time. He appears well-developed and well-nourished.  HENT:  Mouth/Throat: Oropharynx is clear and moist.  Eyes: Conjunctivae are normal. No scleral icterus.  Cardiovascular:  Pulses:      Femoral pulses are 2+ on the right side, and 2+ on the left side.      Dorsalis pedis pulses are 2+ on the right side, and 2+ on the left side.  No lower leg edema  Abdominal: Normal appearance.    Small amount irritation around stoma and granulation tissue as well.  Genitourinary:     Small residual rectal pocket  Neurological: He is alert and oriented to person, place, and time.  Skin: Skin is warm and dry.  Psychiatric: His behavior is normal.    Data Reviewed 2016 medical oncology notes reviewed. Offered post surgery adjuvant chemotherapy. Declined. December  CEA 4.9, unchanged from prior value of 5.0. Significantly improved from preoperative value of 33.  Assessment    Mild stomal irritation secondary to way for fit. Patient will obtain a belt for his colostomy bag.  Slow but said he healing of the perineal wound. No evidence of infection.    Plan    The patient requested a refill on his insulin. He has been encouraged to contact Dr. Hall Busing in this regard.  He was given a refill of Percocet 5/325, #120 to use 1 at night with a repeat 1 if needed. Have discouraged used of ongoing morphine.  Patient will continue to increase his activity as tolerated. He has shown significant improvement since his last visit.  The patient is determined that he is not to take additional chemotherapy and for that reason he doesn't see a need for ongoing follow-up with medical oncology. We will obtain a CEA at his next visit.  Considering there were polyps in the transverse and descending colon which were left in situ at the time of his right colectomy/APR, it may be time to discuss colonoscopy at that time.     Follow up in 3 months.   PCP:  Benita Stabile  This information has been scribed by Karie Fetch Davis.    Robert Bellow 01/18/2016, 1:29 PM

## 2016-02-12 ENCOUNTER — Ambulatory Visit: Payer: Medicare Other | Admitting: General Surgery

## 2016-02-18 IMAGING — PT NM PET TUM IMG INITIAL (PI) SKULL BASE T - THIGH
1 of 9 series · 1 of 25 positions shown · non-contrast
Comparison: None.

CLINICAL DATA: Initial treatment strategy for rectal cancer.

EXAM:
NUCLEAR MEDICINE PET SKULL BASE TO THIGH
TECHNIQUE: 12.5 mCi F-18 FDG was injected intravenously. Full-ring PET imaging
was performed from the skull base to thigh after the radiotracer. CT
data was obtained and used for attenuation correction and anatomic
localization.
FASTING BLOOD GLUCOSE:  Value: 127 mg/dl

[Series 3: ct wb 5.0 b30f · axial · 5.0mm · 0.98mm/px · 1 of 329 slices shown]
[im 329/329  brain]
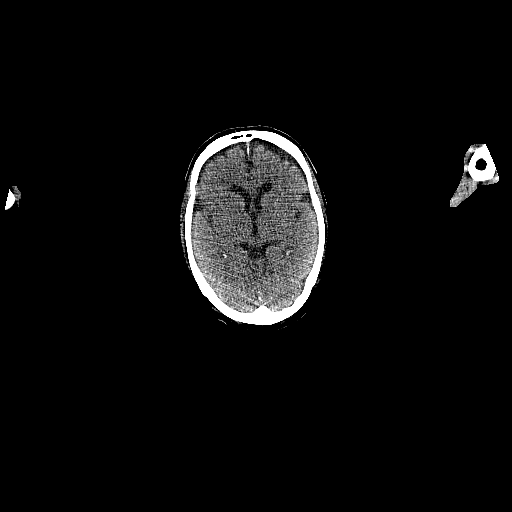

[1 of 25 positions shown; findings below may reference images not displayed]

FINDINGS: NECK

No hypermetabolic lymph nodes in the neck.

CHEST

No hypermetabolic mediastinal or hilar nodes. No suspicious
pulmonary nodules on the CT scan. Emphysematous changes are noted.
No acute pulmonary findings. No pleural effusion.

ABDOMEN/PELVIS

Hypermetabolic mass in the ascending colon near the hepatic flexure
with SUV max of 19.0. On the CT scan there appears to be a 2.4 cm
correlating mass. There is also a benign-appearing lipoma slightly
more cephalad. Recommend correlation with recent colonoscopy.

There is also a hypermetabolic rectal mass with SUV max of 10.2.
This correlates with the recent colonoscopy. There are small
perirectal lymph nodes noted. The largest measures 8 mm on image
number 254. This is weakly hypermetabolic at 1.0 SUV max. Very small
scattered retroperitoneal lymph nodes are also noted which are not
hypermetabolic. No liver lesions to suggest hepatic metastatic
disease.

Borderline splenomegaly

No inguinal mass or adenopathy

SKELETON

No focal hypermetabolic activity to suggest skeletal metastasis.
IMPRESSION: 1. Hypermetabolic mass in the ascending colon consistent with
neoplasm.
2. Hypermetabolic rectal mass consistent with neoplasm. There are
small scattered perirectal lymph node without hypermetabolism but
certainly suspicious for lymph node involvement. No enlarged or
hypermetabolic nodes in the sigmoid mesocolon or retroperitoneum.
Small scattered retroperitoneal lymph nodes are indeterminate but
likely benign.
3. No findings for metastatic disease involving the neck or chest.

## 2016-02-24 IMAGING — CR DG CHEST 1V
1 series · 1 of 1 positions shown · non-contrast
Comparison: None.

CLINICAL DATA: Post Port-A-Cath placement

EXAM:
CHEST  1 VIEW

[ap]
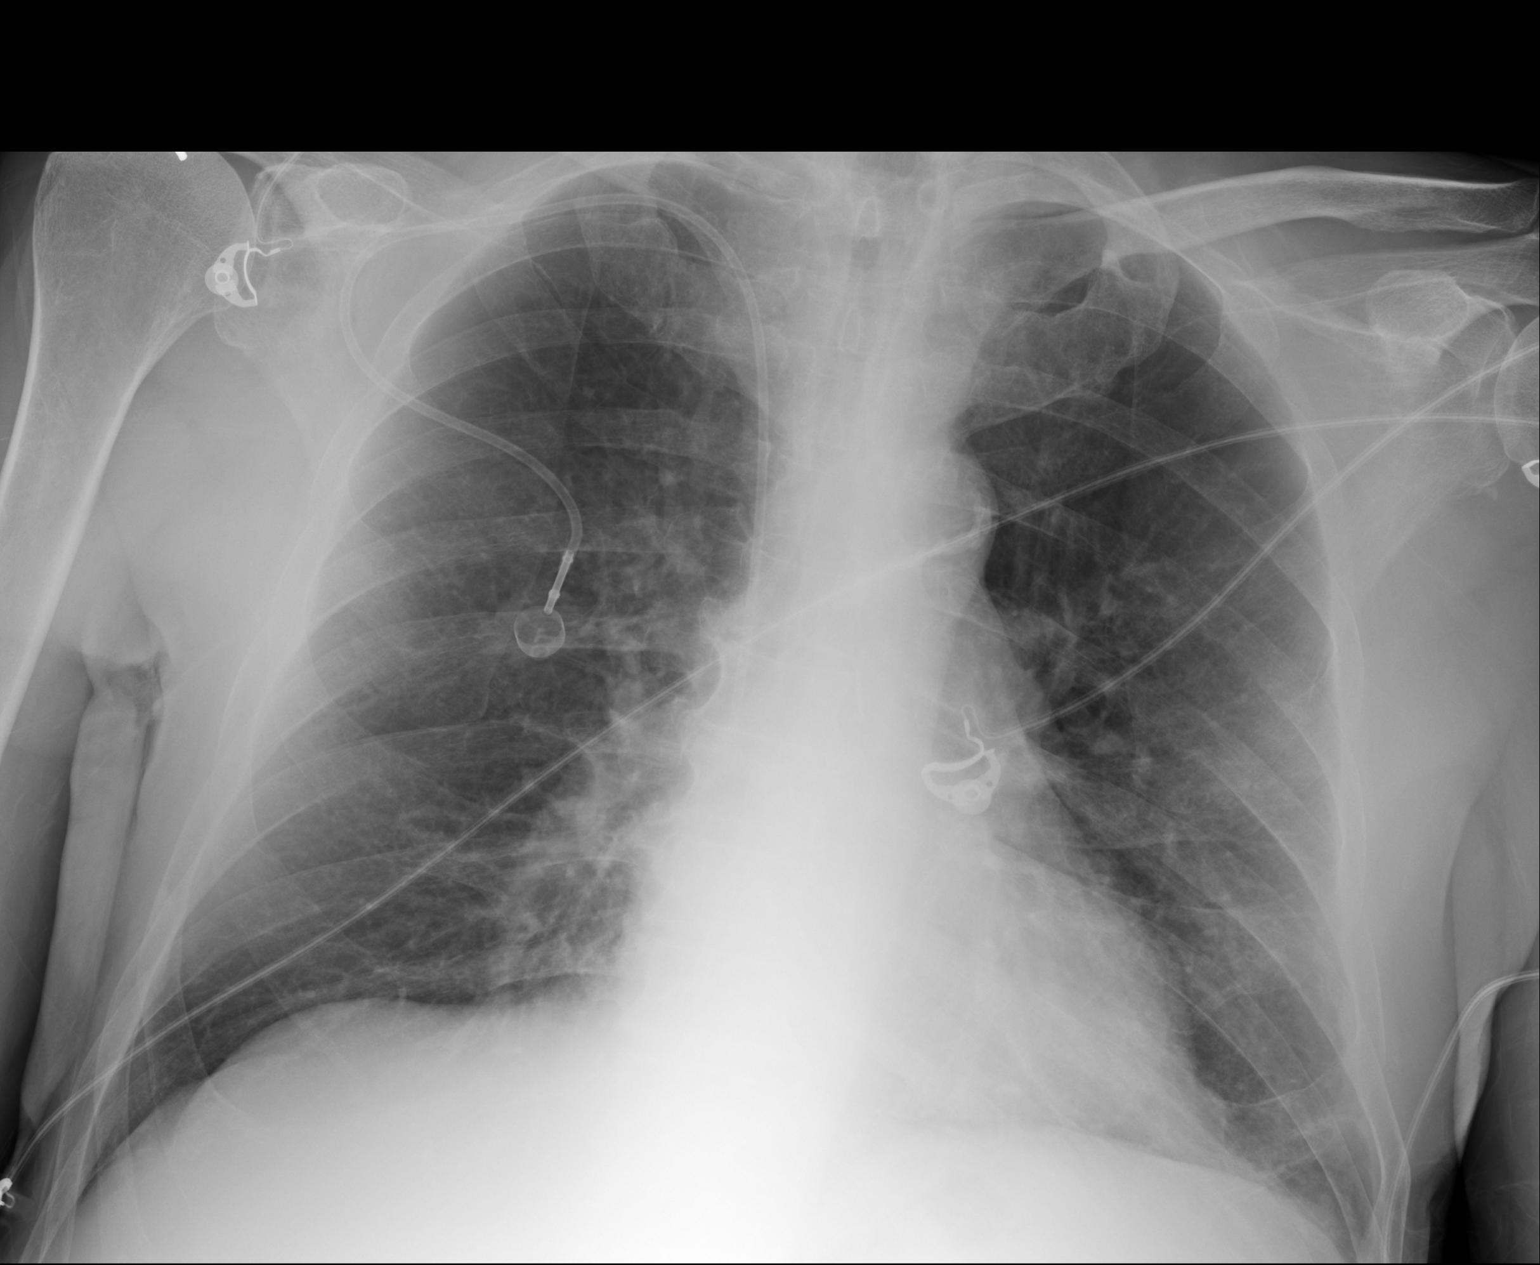

[1 of 1 positions shown; findings below may reference images not displayed]

FINDINGS: A right-sided Port-A-Cath is present with the tip overlying the
lower SVC near the expected SVC -RA junction. No pneumothorax is
seen. The lungs are clear. The heart is within upper limits of
normal.
IMPRESSION: Right-sided Port-A-Cath tip overlies the lower SVC. No pneumothorax.

## 2016-04-16 ENCOUNTER — Encounter: Payer: Self-pay | Admitting: *Deleted

## 2016-04-22 ENCOUNTER — Encounter: Payer: Self-pay | Admitting: General Surgery

## 2016-04-22 ENCOUNTER — Ambulatory Visit (INDEPENDENT_AMBULATORY_CARE_PROVIDER_SITE_OTHER): Payer: Medicare Other | Admitting: General Surgery

## 2016-04-22 VITALS — BP 118/64 | HR 86 | Resp 14 | Ht 72.0 in | Wt 186.0 lb

## 2016-04-22 DIAGNOSIS — R32 Unspecified urinary incontinence: Secondary | ICD-10-CM

## 2016-04-22 DIAGNOSIS — C20 Malignant neoplasm of rectum: Secondary | ICD-10-CM

## 2016-04-22 NOTE — Patient Instructions (Signed)
Colonoscopy A colonoscopy is an exam to look at the entire large intestine (colon). This exam can help find problems such as tumors, polyps, inflammation, and areas of bleeding. The exam takes about 1 hour.  LET YOUR HEALTH CARE PROVIDER KNOW ABOUT:   Any allergies you have.  All medicines you are taking, including vitamins, herbs, eye drops, creams, and over-the-counter medicines.  Previous problems you or members of your family have had with the use of anesthetics.  Any blood disorders you have.  Previous surgeries you have had.  Medical conditions you have. RISKS AND COMPLICATIONS  Generally, this is a safe procedure. However, as with any procedure, complications can occur. Possible complications include:  Bleeding.  Tearing or rupture of the colon wall.  Reaction to medicines given during the exam.  Infection (rare). BEFORE THE PROCEDURE   Ask your health care provider about changing or stopping your regular medicines.  You may be prescribed an oral bowel prep. This involves drinking a large amount of medicated liquid, starting the day before your procedure. The liquid will cause you to have multiple loose stools until your stool is almost clear or light green. This cleans out your colon in preparation for the procedure.  Do not eat or drink anything else once you have started the bowel prep, unless your health care provider tells you it is safe to do so.  Arrange for someone to drive you home after the procedure. PROCEDURE   You will be given medicine to help you relax (sedative).  You will lie on your side with your knees bent.  A long, flexible tube with a light and camera on the end (colonoscope) will be inserted through the rectum and into the colon. The camera sends video back to a computer screen as it moves through the colon. The colonoscope also releases carbon dioxide gas to inflate the colon. This helps your health care provider see the area better.  During  the exam, your health care provider may take a small tissue sample (biopsy) to be examined under a microscope if any abnormalities are found.  The exam is finished when the entire colon has been viewed. AFTER THE PROCEDURE   Do not drive for 24 hours after the exam.  You may have a small amount of blood in your stool.  You may pass moderate amounts of gas and have mild abdominal cramping or bloating. This is caused by the gas used to inflate your colon during the exam.  Ask when your test results will be ready and how you will get your results. Make sure you get your test results.   This information is not intended to replace advice given to you by your health care provider. Make sure you discuss any questions you have with your health care provider.   Document Released: 10/11/2000 Document Revised: 08/04/2013 Document Reviewed: 06/21/2013 Elsevier Interactive Patient Education 2016 Elsevier Inc.  

## 2016-04-22 NOTE — Progress Notes (Signed)
Patient ID: Gary Hampton, male   DOB: Jan 19, 1941, 75 y.o.   MRN: [id number]  Chief Complaint  Patient presents with  . Follow-up    rectal cancer    HPI Gary Hampton is a 75 y.o. male.  Here today for follow up rectal cancer. He states he is doing "fair". He is controlling his blood sugar better eating one meal a day but snacks often. He states he is improving and he can walk further than he could in January. He states he does still have urinary incontinence at night. He drinks 2 pepsi a day and some beer.He has not been avoiding nighttime fluids, and this may be contributing to his large volume of urine at night. Average blood sugars 110-225.Are clear improved since recent change and insulin dosing by his PCP. He states the ostomy wafer comes off easier when he sweats a lot. He is up 10 pounds since his last visit in March.  HPI  Past Medical History  Diagnosis Date  . Diabetes mellitus without complication ([place])   . Colon polyp   . Hard of hearing   . Cancer [facility])     Rectal    Past Surgical History  Procedure Laterality Date  . Colonoscopy  2013    Dr. Dionne Milo  . Cataract extraction Right   . No past surgeries    . Portacath placement Right 03/09/2015    Procedure: INSERTION PORT-A-CATH;  Surgeon: Robert Bellow, MD;  Location: [facility] ORS;  Service: General;  Laterality: Right;  . Flexible sigmoidoscopy N/A 03/09/2015    Procedure: FLEXIBLE SIGMOIDOSCOPY/rectal biopsy;  Surgeon: Robert Bellow, MD;  Location: [facility] ORS;  Service: General;  Laterality: N/A;  . Colonoscopy N/A 03/15/2015    Procedure: COLONOSCOPY;  Surgeon: Robert Bellow, MD;  Location: [facility] ENDOSCOPY;  Service: Endoscopy;  Laterality: N/A;  . Abdominal perineal bowel resection N/A 07/21/2015    Procedure: Removal of the rectum, anus, and right colon; formation of permanent colostomy;  Surgeon: Robert Bellow, MD;  Location: [facility] ORS;  Service: General;  Laterality: N/A;    No family history on  file.  Social History Social History  Substance Use Topics  . Smoking status: Former Smoker -- 1.00 packs/day for 3 years    Types: Cigarettes, Pipe, Cigars    Quit date: 10/28/1978  . Smokeless tobacco: Never Used  . Alcohol Use: 0.0 oz/week    0 Standard drinks or equivalent per week     Comment: 3/day    No Known Allergies  Current Outpatient Prescriptions  Medication Sig Dispense Refill  . acetaminophen (TYLENOL) 325 MG tablet Take 2 tablets (650 mg total) by mouth every 6 (six) hours as needed for mild pain (or temp > 100). 100 tablet 12  . LANTUS SOLOSTAR 100 UNIT/ML Solostar Pen Inject 22 Units into the skin daily with lunch.     . oxyCODONE-acetaminophen (PERCOCET) 10-650 MG tablet Take 1 tablet by mouth at bedtime and may repeat dose one time if needed. 120 tablet 0  . tamsulosin (FLOMAX) 0.4 MG CAPS capsule Take 0.4 mg by mouth.      No current facility-administered medications for this visit.    Review of Systems Review of Systems  Constitutional: Negative.   Respiratory: Negative.   Cardiovascular: Negative.   Genitourinary: Positive for difficulty urinating.    Blood pressure 118/64, pulse 86, resp. rate 14, height 6' (1.829 m), weight 186 lb (84.369 kg), SpO2 97 %. The patient's weight at the time of his  December 2016 exam: 159 pounds. Physical Exam Physical Exam  Constitutional: He is oriented to person, place, and time. He appears well-developed and well-nourished.  HENT:  Mouth/Throat: Oropharynx is clear and moist.  Eyes: Conjunctivae are normal. No scleral icterus.  Neck: Neck supple.  Cardiovascular: Normal rate, regular rhythm and normal heart sounds.   Pulses:      Femoral pulses are 2+ on the right side, and 2+ on the left side. Pulmonary/Chest: Effort normal and breath sounds normal.  Abdominal: Soft. Normal appearance and bowel sounds are normal. There is no tenderness.  Left colostomy in place.  Genitourinary:  Perineal area clean. No  evidence of local recurrence.  Lymphadenopathy:    He has no cervical adenopathy.       Right: No inguinal adenopathy present.       Left: No inguinal and no supraclavicular adenopathy present.  Neurological: He is alert and oriented to person, place, and time.  Skin: Skin is warm and dry.  Psychiatric: His behavior is normal.       Assessment    Doing well status post APR/right hemicolectomy for extensive polyps.  Nocturnal incontinence. Previous failure to improve with Flomax.  Better glucose control and no diabetic regimen. Diet is not a strong part of the patient's present regimen.      Plan    Doing well at present. Attempts to limit his simple sugars was encouraged. Blood sugars are shortly better than the 250-400 he was running last year.  We'll ask his PCP to obtain a CEA at his next blood draw scheduled for July.  The opportunity to me with urology for options regarding his incontinence was discussed. Decision pending.    Recommend colonoscopy this fall.  The patient is aware to call back for any questions or concerns.   PCP:  Benita Stabile  This information has been scribed by Karie Fetch RN, BSN,BC.   Robert Bellow 04/23/2016, 5:06 PM

## 2016-04-23 DIAGNOSIS — R32 Unspecified urinary incontinence: Secondary | ICD-10-CM | POA: Insufficient documentation

## 2016-05-22 ENCOUNTER — Encounter: Payer: Self-pay | Admitting: *Deleted

## 2016-06-18 ENCOUNTER — Encounter: Payer: Self-pay | Admitting: General Surgery

## 2016-06-18 NOTE — Progress Notes (Signed)
Laboratory studies received from PCP dated 06/05/2016. CEA 5.5, modest elevation from level of 4.9 obtained on 08/18/2015.

## 2016-07-23 ENCOUNTER — Ambulatory Visit (INDEPENDENT_AMBULATORY_CARE_PROVIDER_SITE_OTHER): Payer: Medicare Other | Admitting: General Surgery

## 2016-07-23 ENCOUNTER — Encounter: Payer: Self-pay | Admitting: General Surgery

## 2016-07-23 VITALS — BP 156/74 | HR 88 | Resp 18 | Ht 72.0 in | Wt 192.0 lb

## 2016-07-23 DIAGNOSIS — C20 Malignant neoplasm of rectum: Secondary | ICD-10-CM | POA: Diagnosis not present

## 2016-07-23 MED ORDER — POLYETHYLENE GLYCOL 3350 17 GM/SCOOP PO POWD: 1.0000 | Freq: Once | ORAL | 0 refills | Status: AC

## 2016-07-23 NOTE — Patient Instructions (Addendum)
Colonoscopy A colonoscopy is an exam to look at the entire large intestine (colon). This exam can help find problems such as tumors, polyps, inflammation, and areas of bleeding. The exam takes about 1 hour.  LET [facility] CARE PROVIDER KNOW ABOUT:   Any allergies you have.  All medicines you are taking, including vitamins, herbs, eye drops, creams, and over-the-counter medicines.  Previous problems you or members of your family have had with the use of anesthetics.  Any blood disorders you have.  Previous surgeries you have had.  Medical conditions you have. RISKS AND COMPLICATIONS  Generally, this is a safe procedure. However, as with any procedure, complications can occur. Possible complications include:  Bleeding.  Tearing or rupture of the colon wall.  Reaction to medicines given during the exam.  Infection (rare). BEFORE THE PROCEDURE   Ask your health care provider about changing or stopping your regular medicines.  You may be prescribed an oral bowel prep. This involves drinking a large amount of medicated liquid, starting the day before your procedure. The liquid will cause you to have multiple loose stools until your stool is almost clear or light green. This cleans out your colon in preparation for the procedure.  Do not eat or drink anything else once you have started the bowel prep, unless your health care provider tells you it is safe to do so.  Arrange for someone to drive you home after the procedure. PROCEDURE   You will be given medicine to help you relax (sedative).  You will lie on your side with your knees bent.  A long, flexible tube with a light and camera on the end (colonoscope) will be inserted through the rectum and into the colon. The camera sends video back to a computer screen as it moves through the colon. The colonoscope also releases carbon dioxide gas to inflate the colon. This helps your health care provider see the area better.  During  the exam, your health care provider may take a small tissue sample (biopsy) to be examined under a microscope if any abnormalities are found.  The exam is finished when the entire colon has been viewed. AFTER THE PROCEDURE   Do not drive for 24 hours after the exam.  You may have a small amount of blood in your stool.  You may pass moderate amounts of gas and have mild abdominal cramping or bloating. This is caused by the gas used to inflate your colon during the exam.  Ask when your test results will be ready and how you will get your results. Make sure you get your test results.   This information is not intended to replace advice given to you by your health care provider. Make sure you discuss any questions you have with your health care provider.   Document Released: 10/11/2000 Document Revised: 08/04/2013 Document Reviewed: 06/21/2013 Elsevier Interactive Patient Education [employer].  The patient is scheduled for a Colonoscopy at [facility] on 08/21/16. They are aware to call the day before to get their arrival time. He will decrease his insulin to 10 units the day of prep. Miralax prescription has been sent into the patient's pharmacy. The patient is aware of date and instructions.

## 2016-07-23 NOTE — Progress Notes (Addendum)
Patient ID: Gary Hampton, male   DOB: 11-09-1940, 75 y.o.   MRN: [id number]  Chief Complaint  Patient presents with  . Colonoscopy    HPI Gary Hampton is a 75 y.o. male here today for his three month follow up . He states he is doing well. Patient is here for a possible colonoscopy. He has gotten a puppy and is happy.  HPI  Past Medical History:  Diagnosis Date  . Cancer (HCC)    Rectal  . Colon polyp   . Diabetes mellitus without complication ([place])   . Hard of hearing     Past Surgical History:  Procedure Laterality Date  . ABDOMINAL PERINEAL BOWEL RESECTION N/A 07/21/2015   Procedure: Removal of the rectum, anus, and right colon; formation of permanent colostomy;  Surgeon: Robert Bellow, MD;  Location: [facility] ORS;  Service: General;  Laterality: N/A;  . CATARACT EXTRACTION Right   . COLONOSCOPY  2013   Dr. Dionne Milo  . COLONOSCOPY N/A 03/15/2015   Procedure: COLONOSCOPY;  Surgeon: Robert Bellow, MD;  Location: [facility] ENDOSCOPY;  Service: Endoscopy;  Laterality: N/A;  . FLEXIBLE SIGMOIDOSCOPY N/A 03/09/2015   Procedure: FLEXIBLE SIGMOIDOSCOPY/rectal biopsy;  Surgeon: Robert Bellow, MD;  Location: [facility] ORS;  Service: General;  Laterality: N/A;  . NO PAST SURGERIES    . PORTACATH PLACEMENT Right 03/09/2015   Procedure: INSERTION PORT-A-CATH;  Surgeon: Robert Bellow, MD;  Location: [facility] ORS;  Service: General;  Laterality: Right;    No family history on file.  Social History Social History  Substance Use Topics  . Smoking status: Former Smoker    Packs/day: 1.00    Years: 3.00    Types: Cigarettes, Pipe, Cigars    Quit date: 10/28/1978  . Smokeless tobacco: Never Used  . Alcohol use 0.0 oz/week     Comment: 3/day    No Known Allergies  Current Outpatient Prescriptions  Medication Sig Dispense Refill  . acetaminophen (TYLENOL) 325 MG tablet Take 2 tablets (650 mg total) by mouth every 6 (six) hours as needed for mild pain (or temp > 100). 100 tablet 12  .  gabapentin (NEURONTIN) 600 MG tablet Take 600 mg by mouth at bedtime.    Marland Kitchen LANTUS SOLOSTAR 100 UNIT/ML Solostar Pen Inject 22 Units into the skin daily with lunch.     . loperamide (IMODIUM) 2 MG capsule Take by mouth 2 (two) times daily.    . Melatonin 10 MG CAPS Take by mouth at bedtime.    . metoCLOPramide (REGLAN) 5 MG tablet Take 5 mg by mouth 2 (two) times daily.    Marland Kitchen oxyCODONE-acetaminophen (PERCOCET) 10-650 MG tablet Take 1 tablet by mouth at bedtime and may repeat dose one time if needed. 120 tablet 0  . polyethylene glycol powder (GLYCOLAX/MIRALAX) powder Take 255 g by mouth once. 255 g 0   No current facility-administered medications for this visit.     Review of Systems Review of Systems  Constitutional: Negative.   Respiratory: Negative.   Cardiovascular: Negative.     Blood pressure (!) 156/74, pulse 88, resp. rate 18, height 6' (1.829 m), weight 192 lb (87.1 kg).  Physical Exam Physical Exam  Constitutional: He is oriented to person, place, and time. He appears well-developed and well-nourished.  Cardiovascular: Normal rate, regular rhythm and normal heart sounds.   Pulmonary/Chest: Effort normal and breath sounds normal.  Neurological: He is alert and oriented to person, place, and time.  Skin: Skin is warm and dry.  Psychiatric:  His behavior is normal.    Data Reviewed Laboratory studies dated 06/05/2016 completed at the PCP office were reviewed. Normal renal function. Normal liver function. Hemoglobin A1c 7.4.  Assessment    Doing well status post right colectomy and abdominal area resection. Candidate for screening colonoscopy.      Plan    The patient's weight has marked rebounded from the nadir in November 2016 at 158 pounds. He reports he still not having steady improvement in his exercise tolerance, that it waxes and wanes on a daily basis. Some days he is able to walk completely through [facility] without stopping, other times he has to stop to rest.  With a near 40 pound weight gain over the past year we need to be looking now it better caloric management, but all in all he looks significantly improved from this time last year.     Colonoscopy with possible biopsy/polypectomy prn: Information regarding the procedure, including its potential risks and complications (including but not limited to perforation of the bowel, which may require emergency surgery to repair, and bleeding) was verbally given to the patient. Educational information regarding lower intestinal endoscopy was given to the patient. Written instructions for how to complete the bowel prep using Miralax were provided. The importance of drinking ample fluids to avoid dehydration as a result of the prep emphasized.  The patient is scheduled for a Colonoscopy at [facility] on 08/21/16. They are aware to call the day before to get their arrival time. He will decrease his insulin to 10 units the day of prep. Miralax prescription has been sent into the patient's pharmacy. The patient is aware of date and instructions.    This information has been scribed by Gaspar Cola CMA.   Robert Bellow 07/23/2016, 9:43 PM

## 2016-08-20 ENCOUNTER — Encounter: Payer: Self-pay | Admitting: *Deleted

## 2016-08-21 ENCOUNTER — Encounter
Admission: RE | Disposition: A | Discharge status: Home / Self Care | Payer: Self-pay | Source: Ambulatory Visit | Attending: General Surgery

## 2016-08-21 ENCOUNTER — Ambulatory Visit: Payer: Medicare Other | Admitting: Anesthesiology

## 2016-08-21 ENCOUNTER — Ambulatory Visit
Admission: RE | Admit: 2016-08-21 | Discharge: 2016-08-21 | Disposition: A | Discharge status: Home / Self Care | Payer: Medicare Other | Source: Ambulatory Visit | Attending: General Surgery | Admitting: General Surgery

## 2016-08-21 ENCOUNTER — Encounter: Payer: Self-pay | Admitting: *Deleted

## 2016-08-21 DIAGNOSIS — E119 Type 2 diabetes mellitus without complications: Secondary | ICD-10-CM | POA: Diagnosis not present

## 2016-08-21 DIAGNOSIS — Z85038 Personal history of other malignant neoplasm of large intestine: Secondary | ICD-10-CM | POA: Insufficient documentation

## 2016-08-21 DIAGNOSIS — D120 Benign neoplasm of cecum: Secondary | ICD-10-CM | POA: Insufficient documentation

## 2016-08-21 DIAGNOSIS — Z87891 Personal history of nicotine dependence: Secondary | ICD-10-CM | POA: Insufficient documentation

## 2016-08-21 DIAGNOSIS — D123 Benign neoplasm of transverse colon: Secondary | ICD-10-CM | POA: Insufficient documentation

## 2016-08-21 DIAGNOSIS — D124 Benign neoplasm of descending colon: Secondary | ICD-10-CM | POA: Diagnosis not present

## 2016-08-21 DIAGNOSIS — D649 Anemia, unspecified: Secondary | ICD-10-CM | POA: Diagnosis not present

## 2016-08-21 DIAGNOSIS — Z1211 Encounter for screening for malignant neoplasm of colon: Secondary | ICD-10-CM | POA: Insufficient documentation

## 2016-08-21 DIAGNOSIS — C20 Malignant neoplasm of rectum: Secondary | ICD-10-CM

## 2016-08-21 DIAGNOSIS — Z9841 Cataract extraction status, right eye: Secondary | ICD-10-CM | POA: Diagnosis not present

## 2016-08-21 DIAGNOSIS — Z794 Long term (current) use of insulin: Secondary | ICD-10-CM | POA: Insufficient documentation

## 2016-08-21 DIAGNOSIS — H9190 Unspecified hearing loss, unspecified ear: Secondary | ICD-10-CM | POA: Diagnosis not present

## 2016-08-21 DIAGNOSIS — Z7984 Long term (current) use of oral hypoglycemic drugs: Secondary | ICD-10-CM | POA: Diagnosis not present

## 2016-08-21 HISTORY — PX: COLONOSCOPY WITH PROPOFOL: SHX5780

## 2016-08-21 LAB — GLUCOSE, CAPILLARY: Glucose-Capillary: 271 mg/dL — ABNORMAL HIGH (ref 65–99)

## 2016-08-21 SURGERY — COLONOSCOPY WITH PROPOFOL
Anesthesia: General

## 2016-08-21 MED ORDER — PROPOFOL 10 MG/ML IV BOLUS
INTRAVENOUS | Status: DC | PRN
  Administered 2016-08-21: 60 mg via INTRAVENOUS

## 2016-08-21 MED ORDER — PROPOFOL 500 MG/50ML IV EMUL
INTRAVENOUS | Status: DC | PRN
  Administered 2016-08-21: 150 ug/kg/min via INTRAVENOUS

## 2016-08-21 MED ORDER — SODIUM CHLORIDE 0.9 % IV SOLN
INTRAVENOUS | Status: DC
  Administered 2016-08-21: 12:00:00 via INTRAVENOUS
  Administered 2016-08-21: 1000 mL via INTRAVENOUS

## 2016-08-21 MED ORDER — LIDOCAINE HCL (CARDIAC) 20 MG/ML IV SOLN
INTRAVENOUS | Status: DC | PRN
  Administered 2016-08-21: 60 mg via INTRAVENOUS

## 2016-08-21 MED ORDER — FENTANYL CITRATE (PF) 100 MCG/2ML IJ SOLN
INTRAMUSCULAR | Status: DC | PRN
  Administered 2016-08-21: 50 ug via INTRAVENOUS

## 2016-08-21 MED ORDER — MIDAZOLAM HCL 2 MG/2ML IJ SOLN
INTRAMUSCULAR | Status: DC | PRN
  Administered 2016-08-21: 1 mg via INTRAVENOUS

## 2016-08-21 NOTE — Transfer of Care (Signed)
Immediate Anesthesia Transfer of Care Note  Patient: Gary Hampton  Procedure(s) Performed: Procedure(s): COLONOSCOPY WITH PROPOFOL (N/A)  Patient Location: PACU and Endoscopy Unit  Anesthesia Type:General  Level of Consciousness: sedated  Airway & Oxygen Therapy: Patient Spontanous Breathing and Patient connected to nasal cannula oxygen  Post-op Assessment: Report given to RN and Post -op Vital signs reviewed and stable  Post vital signs: Reviewed and stable  Last Vitals:  Vitals:   08/21/16 1026 08/21/16 1231  BP: 135/80 104/74  Pulse: (!) 104 82  Resp: 20 16  Temp: 36.5 C (!) [id number]    Complications: No apparent anesthesia complications

## 2016-08-21 NOTE — H&P (Signed)
Gary Hampton [id number] May 05, 1941     HPI: One year status post combined right hemicolectomy and abdominal perineal resection for a large polyp of the ascending colon and malignancy of the rectum. Previously identified with colon polyps in the transverse colon. For repeat colonoscopy.  Patient reports several weeks ago he began to feel less well and since that time his medications have been changed by his PCP. Blood sugars have been higher than usual.  No abdominal pain.  Tolerated the prep well but reported he is not anxious to have any repeat exams in the near future.  Prescriptions Prior to Admission  Medication Sig Dispense Refill Last Dose  . acetaminophen (TYLENOL) 325 MG tablet Take 2 tablets (650 mg total) by mouth every 6 (six) hours as needed for mild pain (or temp > 100). 100 tablet 12 Taking  . LANTUS SOLOSTAR 100 UNIT/ML Solostar Pen Inject 22 Units into the skin daily with lunch.    Taking  . loperamide (IMODIUM) 2 MG capsule Take by mouth 2 (two) times daily.     . Melatonin 10 MG CAPS Take by mouth at bedtime.   Taking  . oxyCODONE-acetaminophen (PERCOCET) 10-650 MG tablet Take 1 tablet by mouth at bedtime and may repeat dose one time if needed. 120 tablet 0 Taking  . [DISCONTINUED] gabapentin (NEURONTIN) 600 MG tablet Take 600 mg by mouth at bedtime.   Taking  . [DISCONTINUED] metoCLOPramide (REGLAN) 5 MG tablet Take 5 mg by mouth 2 (two) times daily.   Taking   No Known Allergies Past Medical History:  Diagnosis Date  . Cancer (HCC)    Rectal  . Colon polyp   . Diabetes mellitus without complication ([place])   . Hard of hearing    Past Surgical History:  Procedure Laterality Date  . ABDOMINAL PERINEAL BOWEL RESECTION N/A 07/21/2015   Procedure: Removal of the rectum, anus, and right colon; formation of permanent colostomy;  Surgeon: Robert Bellow, MD;  Location: [facility] ORS;  Service: General;  Laterality: N/A;  . CATARACT EXTRACTION Right   . COLONOSCOPY  2013    Dr. Dionne Milo  . COLONOSCOPY N/A 03/15/2015   Procedure: COLONOSCOPY;  Surgeon: Robert Bellow, MD;  Location: [facility] ENDOSCOPY;  Service: Endoscopy;  Laterality: N/A;  . FLEXIBLE SIGMOIDOSCOPY N/A 03/09/2015   Procedure: FLEXIBLE SIGMOIDOSCOPY/rectal biopsy;  Surgeon: Robert Bellow, MD;  Location: [facility] ORS;  Service: General;  Laterality: N/A;  . NO PAST SURGERIES    . PORTACATH PLACEMENT Right 03/09/2015   Procedure: INSERTION PORT-A-CATH;  Surgeon: Robert Bellow, MD;  Location: [facility] ORS;  Service: General;  Laterality: Right;   Social History   Social History  . Marital status: Divorced    Spouse name: N/A  . Number of children: N/A  . Years of education: N/A   Occupational History  . Not on file.   Social History Main Topics  . Smoking status: Former Smoker    Packs/day: 1.00    Years: 3.00    Types: Cigarettes, Pipe, Cigars    Quit date: 10/28/1978  . Smokeless tobacco: Never Used  . Alcohol use 0.0 oz/week     Comment: 3/day  . Drug use: No  . Sexual activity: Not on file   Other Topics Concern  . Not on file   Social History Narrative  . No narrative on file   Social History   Social History Narrative  . No narrative on file     ROS: Negative.  PE: HEENT: Negative. Lungs: Clear. Cardio: RR.  Assessment/Plan:  Proceed with planned endoscopy.    Robert Bellow 08/21/2016

## 2016-08-21 NOTE — Op Note (Signed)
[facility] Patient Name: Gary Hampton Procedure Date: 08/21/2016 11:29 AM MRN: [id number] Account #: [id number] Date of Birth: 11/12/1940 Admit Type: Outpatient Age: 75 Room: [facility] ENDO ROOM 1 Gender: Male Note Status: Finalized Procedure:            Colonoscopy Indications:          High risk colon cancer surveillance: Personal history                        of colon cancer Providers:            Robert Bellow, MD Referring MD:         Leona Carry. Hall Busing, MD (Referring MD) Medicines:            Monitored Anesthesia Care Complications:        No immediate complications. Procedure:            Pre-Anesthesia Assessment:                       - Prior to the procedure, a History and Physical was                        performed, and patient medications, allergies and                        sensitivities were reviewed. The patient's tolerance of                        previous anesthesia was reviewed.                       - The risks and benefits of the procedure and the                        sedation options and risks were discussed with the                        patient. All questions were answered and informed                        consent was obtained.                       After obtaining informed consent, the colonoscope was                        passed under direct vision. Throughout the procedure,                        the patient's blood pressure, pulse, and oxygen                        saturations were monitored continuously. The                        Colonoscope was introduced through the anus and                        advanced to the the ileocolonic anastomosis. The  colonoscopy was performed without difficulty. The                        patient tolerated the procedure well. The quality of                        the bowel preparation was excellent. Findings:      Two sessile polyps were found in the  anastomosis. The polyps were 8 to       16 mm in size. These polyps were removed with a hot snare. Resection and       retrieval were complete.      Two sessile polyps were found in the mid transverse colon. The polyps       were 10 to 15 mm in size. These polyps were removed with a hot snare.       Resection and retrieval were complete.Clip applied to larger lesion for       hemostasis.      Four sessile and semi-pedunculated polyps were found in the descending       colon and proximal descending colon. The polyps were 7 to 14 mm in size.       These polyps were removed with a hot snare. Resection and retrieval were       complete. Impression:           - Two 8 to 16 mm polyps at the anastomosis, removed                        with a hot snare. Resected and retrieved.                       - Two 10 to 15 mm polyps in the mid transverse colon,                        removed with a hot snare. Resected and retrieved.                       - Four 7 to 14 mm polyps in the descending colon and in                        the proximal descending colon, removed with a hot                        snare. Resected and retrieved. Recommendation:       - Telephone endoscopist for pathology results in 1 week. Procedure Code(s):    --- Professional ---                       [phone], Colonoscopy, flexible; with removal of tumor(s),                        polyp(s), or other lesion(s) by snare technique Diagnosis Code(s):    --- Professional ---                       [id number], Personal history of other malignant neoplasm                        of large intestine  D12.6, Benign neoplasm of colon, unspecified                       D12.3, Benign neoplasm of transverse colon (hepatic                        flexure or splenic flexure)                       D12.4, Benign neoplasm of descending colon CPT copyright 2016 American Medical Association. All rights reserved. The codes documented in  this report are preliminary and upon coder review may  be revised to meet current compliance requirements. Robert Bellow, MD 08/21/2016 12:30:27 PM This report has been signed electronically. Number of Addenda: 0 Note Initiated On: 08/21/2016 11:29 AM Scope Withdrawal Time: 0 hours 40 minutes 39 seconds  Total Procedure Duration: 0 hours 48 minutes 19 seconds       [facility]

## 2016-08-21 NOTE — Anesthesia Postprocedure Evaluation (Signed)
Anesthesia Post Note  Patient: Gary Hampton  Procedure(s) Performed: Procedure(s) (LRB): COLONOSCOPY WITH PROPOFOL (N/A)  Patient location during evaluation: PACU Anesthesia Type: General Level of consciousness: awake Pain management: pain level controlled Vital Signs Assessment: post-procedure vital signs reviewed and stable Respiratory status: spontaneous breathing Cardiovascular status: stable Anesthetic complications: no    Last Vitals:  Vitals:   08/21/16 1026 08/21/16 1231  BP: 135/80 104/74  Pulse: (!) 104 82  Resp: 20 16  Temp: 36.5 C (!) 35.9 C    Last Pain:  Vitals:   08/21/16 1231  TempSrc: Tympanic                 VAN STAVEREN,Halla Chopp

## 2016-08-21 NOTE — Anesthesia Preprocedure Evaluation (Signed)
Anesthesia Evaluation  Patient identified by MRN, date of birth, ID band Patient awake    Reviewed: Allergy & Precautions, NPO status , Patient's Chart, lab work & pertinent test results  Airway Mallampati: II       Dental  (+) Upper Dentures, Lower Dentures   Pulmonary neg pulmonary ROS, former smoker,     + decreased breath sounds      Cardiovascular Exercise Tolerance: Poor  Rhythm:Regular Rate:Normal     Neuro/Psych negative neurological ROS  negative psych ROS   GI/Hepatic negative GI ROS, Neg liver ROS, Hx of colon ca   Endo/Other  diabetes, Type 2, Oral Hypoglycemic Agents  Renal/GU negative Renal ROS     Musculoskeletal   Abdominal Normal abdominal exam  (+)   Peds negative pediatric ROS (+)  Hematology  (+) anemia ,   Anesthesia Other Findings   Reproductive/Obstetrics                             Anesthesia Physical Anesthesia Plan  ASA: III  Anesthesia Plan: General   Post-op Pain Management:    Induction: Intravenous  Airway Management Planned: Natural Airway and Nasal Cannula  Additional Equipment:   Intra-op Plan:   Post-operative Plan:   Informed Consent: I have reviewed the patients History and Physical, chart, labs and discussed the procedure including the risks, benefits and alternatives for the proposed anesthesia with the patient or authorized representative who has indicated his/her understanding and acceptance.     Plan Discussed with: CRNA  Anesthesia Plan Comments:         Anesthesia Quick Evaluation

## 2016-08-21 NOTE — Anesthesia Procedure Notes (Signed)
Date/Time: 08/21/2016 11:32 AM Performed by: Doreen Salvage Pre-anesthesia Checklist: Patient identified, Emergency Drugs available, Suction available and Patient being monitored Patient Re-evaluated:Patient Re-evaluated prior to inductionOxygen Delivery Method: Nasal cannula Intubation Type: IV induction Dental Injury: Teeth and Oropharynx as per pre-operative assessment  Comments: Nasal cannula with etCO2 monitoring

## 2016-08-22 ENCOUNTER — Telehealth: Payer: Self-pay | Admitting: *Deleted

## 2016-08-22 LAB — SURGICAL PATHOLOGY

## 2016-08-22 NOTE — Telephone Encounter (Signed)
-----   Message from Robert Bellow, MD sent at 08/22/2016  9:38 AM EDT ----- The patient had 8 polyps removed during yesterday's colonoscopy. He had been feeling a little weak after the prep. Please call him and see how he is getting along.

## 2016-08-27 NOTE — Telephone Encounter (Signed)
No return call from pt, MD aware, follow up as scheduled.

## 2016-10-25 ENCOUNTER — Encounter: Payer: Self-pay | Admitting: Emergency Medicine

## 2016-10-25 ENCOUNTER — Inpatient Hospital Stay
Admission: EM | Admit: 2016-10-25 | Discharge: 2016-11-04 | DRG: 871 | Disposition: A | Discharge status: Hospice | Payer: Medicare Other | Attending: Internal Medicine | Admitting: Internal Medicine

## 2016-10-25 ENCOUNTER — Inpatient Hospital Stay: Payer: Medicare Other

## 2016-10-25 ENCOUNTER — Emergency Department: Payer: Medicare Other

## 2016-10-25 DIAGNOSIS — H9190 Unspecified hearing loss, unspecified ear: Secondary | ICD-10-CM | POA: Diagnosis present

## 2016-10-25 DIAGNOSIS — R6521 Severe sepsis with septic shock: Secondary | ICD-10-CM | POA: Diagnosis present

## 2016-10-25 DIAGNOSIS — E871 Hypo-osmolality and hyponatremia: Secondary | ICD-10-CM

## 2016-10-25 DIAGNOSIS — N19 Unspecified kidney failure: Secondary | ICD-10-CM

## 2016-10-25 DIAGNOSIS — K921 Melena: Secondary | ICD-10-CM

## 2016-10-25 DIAGNOSIS — W19XXXA Unspecified fall, initial encounter: Secondary | ICD-10-CM | POA: Diagnosis present

## 2016-10-25 DIAGNOSIS — E11649 Type 2 diabetes mellitus with hypoglycemia without coma: Secondary | ICD-10-CM | POA: Diagnosis present

## 2016-10-25 DIAGNOSIS — N170 Acute kidney failure with tubular necrosis: Secondary | ICD-10-CM | POA: Diagnosis present

## 2016-10-25 DIAGNOSIS — N12 Tubulo-interstitial nephritis, not specified as acute or chronic: Secondary | ICD-10-CM | POA: Diagnosis not present

## 2016-10-25 DIAGNOSIS — E860 Dehydration: Secondary | ICD-10-CM | POA: Diagnosis present

## 2016-10-25 DIAGNOSIS — N189 Chronic kidney disease, unspecified: Secondary | ICD-10-CM | POA: Diagnosis present

## 2016-10-25 DIAGNOSIS — Z86010 Personal history of colonic polyps: Secondary | ICD-10-CM

## 2016-10-25 DIAGNOSIS — I129 Hypertensive chronic kidney disease with stage 1 through stage 4 chronic kidney disease, or unspecified chronic kidney disease: Secondary | ICD-10-CM | POA: Diagnosis present

## 2016-10-25 DIAGNOSIS — L89153 Pressure ulcer of sacral region, stage 3: Secondary | ICD-10-CM | POA: Diagnosis present

## 2016-10-25 DIAGNOSIS — T383X6A Underdosing of insulin and oral hypoglycemic [antidiabetic] drugs, initial encounter: Secondary | ICD-10-CM | POA: Diagnosis present

## 2016-10-25 DIAGNOSIS — Z933 Colostomy status: Secondary | ICD-10-CM

## 2016-10-25 DIAGNOSIS — B9620 Unspecified Escherichia coli [E. coli] as the cause of diseases classified elsewhere: Secondary | ICD-10-CM | POA: Diagnosis present

## 2016-10-25 DIAGNOSIS — Z9049 Acquired absence of other specified parts of digestive tract: Secondary | ICD-10-CM

## 2016-10-25 DIAGNOSIS — R55 Syncope and collapse: Secondary | ICD-10-CM | POA: Diagnosis not present

## 2016-10-25 DIAGNOSIS — Z9221 Personal history of antineoplastic chemotherapy: Secondary | ICD-10-CM

## 2016-10-25 DIAGNOSIS — E1159 Type 2 diabetes mellitus with other circulatory complications: Secondary | ICD-10-CM | POA: Diagnosis not present

## 2016-10-25 DIAGNOSIS — N179 Acute kidney failure, unspecified: Secondary | ICD-10-CM | POA: Diagnosis present

## 2016-10-25 DIAGNOSIS — Z85048 Personal history of other malignant neoplasm of rectum, rectosigmoid junction, and anus: Secondary | ICD-10-CM

## 2016-10-25 DIAGNOSIS — Z923 Personal history of irradiation: Secondary | ICD-10-CM

## 2016-10-25 DIAGNOSIS — R110 Nausea: Secondary | ICD-10-CM | POA: Diagnosis not present

## 2016-10-25 DIAGNOSIS — A401 Sepsis due to streptococcus, group B: Principal | ICD-10-CM | POA: Diagnosis present

## 2016-10-25 DIAGNOSIS — E8770 Fluid overload, unspecified: Secondary | ICD-10-CM | POA: Diagnosis present

## 2016-10-25 DIAGNOSIS — N1330 Unspecified hydronephrosis: Secondary | ICD-10-CM | POA: Diagnosis not present

## 2016-10-25 DIAGNOSIS — R339 Retention of urine, unspecified: Secondary | ICD-10-CM | POA: Diagnosis present

## 2016-10-25 DIAGNOSIS — R112 Nausea with vomiting, unspecified: Secondary | ICD-10-CM | POA: Diagnosis present

## 2016-10-25 DIAGNOSIS — R1110 Vomiting, unspecified: Secondary | ICD-10-CM

## 2016-10-25 DIAGNOSIS — E1165 Type 2 diabetes mellitus with hyperglycemia: Secondary | ICD-10-CM | POA: Diagnosis present

## 2016-10-25 DIAGNOSIS — G9341 Metabolic encephalopathy: Secondary | ICD-10-CM | POA: Diagnosis present

## 2016-10-25 DIAGNOSIS — L8990 Pressure ulcer of unspecified site, unspecified stage: Secondary | ICD-10-CM | POA: Insufficient documentation

## 2016-10-25 DIAGNOSIS — Z794 Long term (current) use of insulin: Secondary | ICD-10-CM

## 2016-10-25 DIAGNOSIS — N320 Bladder-neck obstruction: Secondary | ICD-10-CM | POA: Diagnosis present

## 2016-10-25 DIAGNOSIS — R739 Hyperglycemia, unspecified: Secondary | ICD-10-CM | POA: Diagnosis not present

## 2016-10-25 DIAGNOSIS — Z66 Do not resuscitate: Secondary | ICD-10-CM | POA: Diagnosis present

## 2016-10-25 DIAGNOSIS — N319 Neuromuscular dysfunction of bladder, unspecified: Secondary | ICD-10-CM | POA: Diagnosis not present

## 2016-10-25 DIAGNOSIS — Z515 Encounter for palliative care: Secondary | ICD-10-CM

## 2016-10-25 DIAGNOSIS — Z87891 Personal history of nicotine dependence: Secondary | ICD-10-CM

## 2016-10-25 DIAGNOSIS — N401 Enlarged prostate with lower urinary tract symptoms: Secondary | ICD-10-CM | POA: Diagnosis present

## 2016-10-25 DIAGNOSIS — I480 Paroxysmal atrial fibrillation: Secondary | ICD-10-CM | POA: Diagnosis present

## 2016-10-25 DIAGNOSIS — E1122 Type 2 diabetes mellitus with diabetic chronic kidney disease: Secondary | ICD-10-CM | POA: Diagnosis present

## 2016-10-25 DIAGNOSIS — R45851 Suicidal ideations: Secondary | ICD-10-CM | POA: Diagnosis present

## 2016-10-25 DIAGNOSIS — D500 Iron deficiency anemia secondary to blood loss (chronic): Secondary | ICD-10-CM | POA: Diagnosis present

## 2016-10-25 DIAGNOSIS — R188 Other ascites: Secondary | ICD-10-CM | POA: Diagnosis present

## 2016-10-25 DIAGNOSIS — Z7189 Other specified counseling: Secondary | ICD-10-CM

## 2016-10-25 DIAGNOSIS — D649 Anemia, unspecified: Secondary | ICD-10-CM

## 2016-10-25 DIAGNOSIS — Z79891 Long term (current) use of opiate analgesic: Secondary | ICD-10-CM

## 2016-10-25 DIAGNOSIS — Z79899 Other long term (current) drug therapy: Secondary | ICD-10-CM

## 2016-10-25 DIAGNOSIS — I4891 Unspecified atrial fibrillation: Secondary | ICD-10-CM | POA: Diagnosis present

## 2016-10-25 DIAGNOSIS — R338 Other retention of urine: Secondary | ICD-10-CM

## 2016-10-25 DIAGNOSIS — E119 Type 2 diabetes mellitus without complications: Secondary | ICD-10-CM

## 2016-10-25 LAB — COMPREHENSIVE METABOLIC PANEL
ALK PHOS: 72 U/L (ref 38–126)
ALT: 20 U/L (ref 17–63)
AST: 48 U/L — ABNORMAL HIGH (ref 15–41)
Albumin: 3.2 g/dL — ABNORMAL LOW (ref 3.5–5.0)
Anion gap: 14 (ref 5–15)
BUN: 98 mg/dL — AB (ref 6–20)
CHLORIDE: 87 mmol/L — AB (ref 101–111)
CO2: 26 mmol/L (ref 22–32)
CREATININE: 3.46 mg/dL — AB (ref 0.61–1.24)
Calcium: 8.2 mg/dL — ABNORMAL LOW (ref 8.9–10.3)
GFR calc Af Amer: 18 mL/min — ABNORMAL LOW (ref >60)
GFR, EST NON AFRICAN AMERICAN: 16 mL/min — AB (ref >60)
Glucose, Bld: 497 mg/dL — ABNORMAL HIGH (ref 65–99)
Potassium: 4.5 mmol/L (ref 3.5–5.1)
Sodium: 127 mmol/L — ABNORMAL LOW (ref 135–145)
Total Bilirubin: 1.1 mg/dL (ref 0.3–1.2)
Total Protein: 6.7 g/dL (ref 6.5–8.1)

## 2016-10-25 LAB — CBC WITH DIFFERENTIAL/PLATELET
Basophils Absolute: 0 10*3/uL (ref 0–0.1)
Basophils Relative: 0 %
Eosinophils Absolute: 0 10*3/uL (ref 0–0.7)
Eosinophils Relative: 0 %
HEMATOCRIT: 34.7 % — AB (ref 40.0–52.0)
Hemoglobin: 11.9 g/dL — ABNORMAL LOW (ref 13.0–18.0)
Lymphocytes Relative: 3 %
Lymphs Abs: 0.3 10*3/uL — ABNORMAL LOW (ref 1.0–3.6)
MCH: 31.6 pg (ref 26.0–34.0)
MCHC: 34.3 g/dL (ref 32.0–36.0)
MCV: 92.1 fL (ref 80.0–100.0)
MONO ABS: 0.4 10*3/uL (ref 0.2–1.0)
MONOS PCT: 3 %
NEUTROS ABS: 10.4 10*3/uL — AB (ref 1.4–6.5)
Neutrophils Relative %: 94 %
Platelets: 160 10*3/uL (ref 150–440)
RBC: 3.76 MIL/uL — ABNORMAL LOW (ref 4.40–5.90)
RDW: 15.4 % — AB (ref 11.5–14.5)
WBC: 11.1 10*3/uL — ABNORMAL HIGH (ref 3.8–10.6)

## 2016-10-25 LAB — URINALYSIS, COMPLETE (UACMP) WITH MICROSCOPIC
BILIRUBIN URINE: NEGATIVE
Glucose, UA: >=500 mg/dL — AB
Ketones, ur: NEGATIVE mg/dL
NITRITE: NEGATIVE
Protein, ur: 100 mg/dL — AB
SPECIFIC GRAVITY, URINE: 1.012 (ref 1.005–1.030)
pH: 5 (ref 5.0–8.0)

## 2016-10-25 LAB — TYPE AND SCREEN
ABO/RH(D): A POS
Antibody Screen: NEGATIVE

## 2016-10-25 LAB — PROTIME-INR
INR: 1.27
Prothrombin Time: 16 seconds — ABNORMAL HIGH (ref 11.4–15.2)

## 2016-10-25 LAB — TROPONIN I: Troponin I: 0.05 ng/mL (ref <0.03)

## 2016-10-25 LAB — HEMOGLOBIN: HEMOGLOBIN: 10.4 g/dL — AB (ref 13.0–18.0)

## 2016-10-25 LAB — BETA-HYDROXYBUTYRIC ACID: Beta-Hydroxybutyric Acid: 0.39 mmol/L — ABNORMAL HIGH (ref 0.05–0.27)

## 2016-10-25 LAB — LACTIC ACID, PLASMA
LACTIC ACID, VENOUS: 2.8 mmol/L — AB (ref 0.5–1.9)
LACTIC ACID, VENOUS: 1.9 mmol/L (ref 0.5–1.9)

## 2016-10-25 LAB — TSH: TSH: 4.798 u[IU]/mL — ABNORMAL HIGH (ref 0.350–4.500)

## 2016-10-25 LAB — APTT: aPTT: 44 seconds — ABNORMAL HIGH (ref 24–36)

## 2016-10-25 LAB — CK: Total CK: 654 U/L — ABNORMAL HIGH (ref 49–397)

## 2016-10-25 LAB — GLUCOSE, CAPILLARY
Glucose-Capillary: 348 mg/dL — ABNORMAL HIGH (ref 65–99)
Glucose-Capillary: 269 mg/dL — ABNORMAL HIGH (ref 65–99)

## 2016-10-25 LAB — LIPASE, BLOOD: Lipase: <10 U/L — ABNORMAL LOW (ref 11–51)

## 2016-10-25 LAB — MAGNESIUM: MAGNESIUM: 1.4 mg/dL — AB (ref 1.7–2.4)

## 2016-10-25 MED ORDER — SODIUM CHLORIDE 0.9 % IV BOLUS (SEPSIS)
2000.0000 mL | Freq: Once | INTRAVENOUS | Status: AC
  Administered 2016-10-25: 2000 mL via INTRAVENOUS

## 2016-10-25 MED ORDER — INSULIN ASPART 100 UNIT/ML ~~LOC~~ SOLN
0.0000 [IU] | Freq: Every day | SUBCUTANEOUS | Status: DC
  Administered 2016-10-25: 3 [IU] via SUBCUTANEOUS
  Filled 2016-10-25: qty 3

## 2016-10-25 MED ORDER — DILTIAZEM HCL 100 MG IV SOLR
5.0000 mg/h | INTRAVENOUS | Status: DC
  Administered 2016-10-25: 5 mg/h via INTRAVENOUS
  Filled 2016-10-25 (×2): qty 100

## 2016-10-25 MED ORDER — SODIUM CHLORIDE 0.9% FLUSH
3.0000 mL | Freq: Two times a day (BID) | INTRAVENOUS | Status: DC
  Administered 2016-10-25 – 2016-11-04 (×18): 3 mL via INTRAVENOUS

## 2016-10-25 MED ORDER — ALBUTEROL SULFATE (2.5 MG/3ML) 0.083% IN NEBU: 2.5000 mg | INHALATION_SOLUTION | RESPIRATORY_TRACT | Status: DC | PRN

## 2016-10-25 MED ORDER — DEXTROSE 5 % IV SOLN: 1.0000 g | INTRAVENOUS | Status: DC

## 2016-10-25 MED ORDER — ACETAMINOPHEN 325 MG PO TABS: 650.0000 mg | ORAL_TABLET | Freq: Four times a day (QID) | ORAL | Status: DC | PRN

## 2016-10-25 MED ORDER — ACETAMINOPHEN 650 MG RE SUPP: 650.0000 mg | Freq: Four times a day (QID) | RECTAL | Status: DC | PRN

## 2016-10-25 MED ORDER — OXYCODONE HCL ER 10 MG PO T12A
10.0000 mg | EXTENDED_RELEASE_TABLET | Freq: Two times a day (BID) | ORAL | Status: DC
  Administered 2016-10-25 – 2016-11-04 (×19): 10 mg via ORAL
  Filled 2016-10-25 (×19): qty 1

## 2016-10-25 MED ORDER — IOPAMIDOL (ISOVUE-300) INJECTION 61%
15.0000 mL | INTRAVENOUS | Status: AC
  Administered 2016-10-25: 15 mL via ORAL

## 2016-10-25 MED ORDER — DILTIAZEM HCL 25 MG/5ML IV SOLN
10.0000 mg | Freq: Once | INTRAVENOUS | Status: AC
  Administered 2016-10-25: 10 mg via INTRAVENOUS

## 2016-10-25 MED ORDER — CEFTRIAXONE SODIUM-DEXTROSE 1-3.74 GM-% IV SOLR
INTRAVENOUS | Status: AC
  Administered 2016-10-25: 1000 mg
  Filled 2016-10-25: qty 50

## 2016-10-25 MED ORDER — METOPROLOL TARTRATE 50 MG PO TABS
ORAL_TABLET | ORAL | Status: AC
  Administered 2016-10-25: 50 mg via ORAL
  Filled 2016-10-25: qty 1

## 2016-10-25 MED ORDER — SODIUM CHLORIDE 0.9 % IV BOLUS (SEPSIS)
1000.0000 mL | Freq: Once | INTRAVENOUS | Status: AC
  Administered 2016-10-25: 1000 mL via INTRAVENOUS

## 2016-10-25 MED ORDER — OXYBUTYNIN CHLORIDE ER 10 MG PO TB24
10.0000 mg | ORAL_TABLET | Freq: Every day | ORAL | Status: DC
  Administered 2016-10-25 – 2016-11-03 (×10): 10 mg via ORAL
  Filled 2016-10-25 (×12): qty 1

## 2016-10-25 MED ORDER — INSULIN ASPART 100 UNIT/ML ~~LOC~~ SOLN
0.0000 [IU] | Freq: Three times a day (TID) | SUBCUTANEOUS | Status: DC
  Administered 2016-10-26: 5 [IU] via SUBCUTANEOUS
  Administered 2016-10-26 – 2016-10-27 (×2): 2 [IU] via SUBCUTANEOUS
  Administered 2016-10-27 – 2016-10-28 (×2): 3 [IU] via SUBCUTANEOUS
  Administered 2016-10-28 – 2016-10-29 (×2): 2 [IU] via SUBCUTANEOUS
  Administered 2016-10-29: 8 [IU] via SUBCUTANEOUS
  Administered 2016-10-30 (×2): 2 [IU] via SUBCUTANEOUS
  Administered 2016-10-30: 3 [IU] via SUBCUTANEOUS
  Administered 2016-10-31: 09:00:00 5 [IU] via SUBCUTANEOUS
  Administered 2016-10-31: 12:00:00 3 [IU] via SUBCUTANEOUS
  Administered 2016-10-31 – 2016-11-01 (×2): 2 [IU] via SUBCUTANEOUS
  Filled 2016-10-25: qty 3
  Filled 2016-10-25: qty 2
  Filled 2016-10-25: qty 3
  Filled 2016-10-25 (×3): qty 2
  Filled 2016-10-25: qty 3
  Filled 2016-10-25: qty 2
  Filled 2016-10-25 (×2): qty 5
  Filled 2016-10-25 (×2): qty 2
  Filled 2016-10-25: qty 3
  Filled 2016-10-25: qty 2
  Filled 2016-10-25: qty 8

## 2016-10-25 MED ORDER — CEFTRIAXONE SODIUM-DEXTROSE 1-3.74 GM-% IV SOLR
1.0000 g | INTRAVENOUS | Status: DC
  Administered 2016-10-25: 1 g via INTRAVENOUS
  Filled 2016-10-25: qty 50

## 2016-10-25 MED ORDER — INSULIN ASPART 100 UNIT/ML ~~LOC~~ SOLN
6.0000 [IU] | Freq: Once | SUBCUTANEOUS | Status: AC
  Administered 2016-10-25: 6 [IU] via INTRAVENOUS
  Filled 2016-10-25: qty 6

## 2016-10-25 MED ORDER — PANTOPRAZOLE SODIUM 40 MG IV SOLR
INTRAVENOUS | Status: AC
  Administered 2016-10-25: 40 mg via INTRAVENOUS
  Filled 2016-10-25: qty 40

## 2016-10-25 MED ORDER — ONDANSETRON HCL 4 MG PO TABS: 4.0000 mg | ORAL_TABLET | Freq: Four times a day (QID) | ORAL | Status: DC | PRN

## 2016-10-25 MED ORDER — SODIUM CHLORIDE 0.9 % IV SOLN
INTRAVENOUS | Status: DC
  Administered 2016-10-25 – 2016-10-29 (×7): via INTRAVENOUS
  Administered 2016-10-29: 100 mL/h via INTRAVENOUS

## 2016-10-25 MED ORDER — LABETALOL HCL 5 MG/ML IV SOLN
10.0000 mg | Freq: Four times a day (QID) | INTRAVENOUS | Status: DC | PRN
  Filled 2016-10-25: qty 4

## 2016-10-25 MED ORDER — INSULIN GLARGINE 100 UNIT/ML ~~LOC~~ SOLN
14.0000 [IU] | Freq: Every day | SUBCUTANEOUS | Status: DC
  Administered 2016-10-25 – 2016-10-26 (×2): 14 [IU] via SUBCUTANEOUS
  Filled 2016-10-25 (×3): qty 0.14

## 2016-10-25 MED ORDER — PANTOPRAZOLE SODIUM 40 MG IV SOLR
40.0000 mg | Freq: Two times a day (BID) | INTRAVENOUS | Status: DC
  Administered 2016-10-25 – 2016-10-26 (×3): 40 mg via INTRAVENOUS
  Filled 2016-10-25 (×2): qty 40

## 2016-10-25 MED ORDER — ONDANSETRON HCL 4 MG/2ML IJ SOLN: 4.0000 mg | Freq: Four times a day (QID) | INTRAMUSCULAR | Status: DC | PRN

## 2016-10-25 MED ORDER — POLYETHYLENE GLYCOL 3350 17 G PO PACK
17.0000 g | PACK | Freq: Every day | ORAL | Status: DC | PRN
Start: 2016-10-25 — End: 2016-11-04

## 2016-10-25 MED ORDER — HYDROCODONE-ACETAMINOPHEN 5-325 MG PO TABS
1.0000 | ORAL_TABLET | ORAL | Status: DC | PRN
  Administered 2016-10-31: 12:00:00 2 via ORAL
  Administered 2016-11-02: 1 via ORAL
  Filled 2016-10-25: qty 2
  Filled 2016-10-25: qty 1

## 2016-10-25 MED ORDER — METOPROLOL TARTRATE 50 MG PO TABS
50.0000 mg | ORAL_TABLET | Freq: Two times a day (BID) | ORAL | Status: DC
  Administered 2016-10-25 – 2016-11-01 (×13): 50 mg via ORAL
  Filled 2016-10-25 (×13): qty 1

## 2016-10-25 MED ORDER — DILTIAZEM HCL 25 MG/5ML IV SOLN
INTRAVENOUS | Status: AC
  Administered 2016-10-25: 10 mg via INTRAVENOUS
  Filled 2016-10-25: qty 5

## 2016-10-25 MED ORDER — DIPHENOXYLATE-ATROPINE 2.5-0.025 MG PO TABS: 1.0000 | ORAL_TABLET | Freq: Four times a day (QID) | ORAL | Status: DC | PRN

## 2016-10-25 MED ORDER — LOPERAMIDE HCL 2 MG PO CAPS
2.0000 mg | ORAL_CAPSULE | Freq: Two times a day (BID) | ORAL | Status: DC
  Administered 2016-10-25 – 2016-10-29 (×9): 2 mg via ORAL
  Filled 2016-10-25 (×10): qty 1

## 2016-10-25 NOTE — Progress Notes (Signed)
Home meds sent to pharmacy.

## 2016-10-25 NOTE — ED Triage Notes (Signed)
Pt to ED from home via EMS c/o hyperglycemia, vomiting and fall.  Pt lives alone and fell and became too weak and unable to take care of self for 4-5 days.  EMS vitals CBG 566, 120/60, 100 HR, 90% RA.  Pt presents with colostomy bag full of dark stool.

## 2016-10-25 NOTE — Progress Notes (Addendum)
Patient started having tachycardia into the 120s. At this point fluid bolus was given with no response. Later on treatment up into the 140s with atrial fibrillation and one dose of IV Cardizem 10 mg given. Heartrate and down to 120s but returned into the 150s and another dose of IV 10 mg Cardizem repeated.  Patient did not have any pain. Not feeling lightheaded or dizzy. Overall felt better than when he came in. CT scan of the abdomen is still pending.  Heart rate irregularly irregular and tachycardic. Lungs clear No blood in colostomy bag  Heart rate continues to be in the 150s. Atrial fibrillation. We'll start him on a Cardizem drip. Depending on response patient will need to be admitted to ICU or telemetry unit. Lactic acid has improved.  Check TSH. We'll repeat hemoglobin.  CC time spent 35 minutes

## 2016-10-25 NOTE — ED Notes (Signed)
Admitting MD at bedside.

## 2016-10-25 NOTE — Progress Notes (Signed)
Pt. Arrived to unit via stretcher, staff transferred to bed. Tele applied, verified box with Andee Poles, CNA. Skin assessed with Ernst Breach, RN Pt. Has pigment discolorations to posterior and anterior trunk. Right 2nd toe has dry blood filled blister measuring 2.0 x 1.6 x 0.0cm. OTA. Left 1st toe has a serous fluid filled blister measuring 3.2 x 4.0 x 0.0cm. OTA. Left 2nd toe has a serous fluid filled blister measuring 1.0 x 2.0 x 0.0cm. OTA.  Left 3rd toe has a serous fluid filled blister measuring 0.8 x 1.7 x 0.0cm. OTA. De-accessed port a cath to [facility].  Has old surgical scar to rectum. Rectum is draining small amount of tan fluid. Two piece colostomy to LUQ draining black liquid stool. Cardizem drip running at 5mg /H. New 22g IV started in right wrist for ABT and NS. Pt. Alert and oriented, with no c/o pain, SOB or acute distress observed. Pt. on room air, VSS. Pt. Bathed, beard shaved per request. General room orientation given, instruction on how to  Use ascom and call bell system given. Pt. Sitting in bed drinking water. Will continue to monitor pt.

## 2016-10-25 NOTE — Consult Note (Signed)
Consult: Bilateral hydronephrosis Requested by: Dr. Jannifer Franklin  History of Present Illness: Gary Hampton is a 75 year old gentleman with a history of rectal cancer status post neoadjuvant chemotherapy and radiation followed by APR in 2016. He follows with Dr.Byrnett.   He was admitted with generalized weakness nausea and vomiting. His creatinine was found elevated to 3.46. Prior creatinine 0.97 Aug 2017 (scanned lab result). A CT scan of the abdomen and pelvis showed bilateral hydroureteronephrosis down to a distended thick-walled bladder. 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 There was a presacral soft tissue mass measuring 2.9 x 8 cm suspicious for possible recurrence in the surgical bed. The ureters did appear to be patent heading into the bladder and I'm not certain they are involved in that process. His UA showed 6-30 red blood cells, too numerous to count white cells and many bacteria. A culture is pending. His white count was 11.1. Lactic acid initially slightly elevated but corrected with fluid bolus. No fever.   Per the pt, he doesn't really void. He just drips continually in a diaper. No gross hematuria.   Past Medical History:  Diagnosis Date  . Cancer (HCC)    Rectal  . Colon polyp   . Diabetes mellitus without complication ([place])   . Hard of hearing    Past Surgical History:  Procedure Laterality Date  . ABDOMINAL PERINEAL BOWEL RESECTION N/A 07/21/2015   Procedure: Removal of the rectum, anus, and right colon; formation of permanent colostomy;  Surgeon: Robert Bellow, MD;  Location: [facility] ORS;  Service: General;  Laterality: N/A;  . CATARACT EXTRACTION Right   . COLONOSCOPY  2013   Dr. Dionne Milo  . COLONOSCOPY N/A 03/15/2015   Procedure: COLONOSCOPY;  Surgeon: Robert Bellow, MD;  Location: [facility] ENDOSCOPY;  Service: Endoscopy;  Laterality: N/A;  . COLONOSCOPY WITH PROPOFOL N/A 08/21/2016    Procedure: COLONOSCOPY WITH PROPOFOL;  Surgeon: Robert Bellow, MD;  Location: [facility] ENDOSCOPY;  Service: Endoscopy;  Laterality: N/A;  . FLEXIBLE SIGMOIDOSCOPY N/A 03/09/2015   Procedure: FLEXIBLE SIGMOIDOSCOPY/rectal biopsy;  Surgeon: Robert Bellow, MD;  Location: [facility] ORS;  Service: General;  Laterality: N/A;  . NO PAST SURGERIES    . PORTACATH PLACEMENT Right 03/09/2015   Procedure: INSERTION PORT-A-CATH;  Surgeon: Robert Bellow, MD;  Location: [facility] ORS;  Service: General;  Laterality: Right;    Home Medications:  Prescriptions Prior to Admission  Medication Sig Dispense Refill Last Dose  . acetaminophen (TYLENOL) 325 MG tablet Take 2 tablets (650 mg total) by mouth every 6 (six) hours as needed for mild pain (or temp > 100). 100 tablet 12 unknown at unknown  . diphenoxylate-atropine (LOMOTIL) 2.5-0.025 MG tablet Take by mouth every 6 (six) hours as needed for diarrhea or loose stools.   unknown at unknown  . LANTUS SOLOSTAR 100 UNIT/ML Solostar Pen Inject 23 Units into the skin daily with lunch.    unknown at unknown  . loperamide (IMODIUM) 2 MG capsule Take by mouth 2 (two) times daily.   unknown at unknown  . Melatonin 10 MG CAPS Take by mouth at bedtime.   unknown at unknown  . oxybutynin (DITROPAN-XL) 10 MG 24 hr tablet Take 10 mg by mouth at bedtime.   unknown at unknown  . oxyCODONE (OXYCONTIN) 10 mg 12 hr tablet Take 10 mg by mouth every 8 (eight) hours as needed.   unknown at unknown  . oxyCODONE-acetaminophen (  PERCOCET) 10-650 MG tablet Take 1 tablet by mouth at bedtime and may repeat dose one time if needed. (Patient not taking: Reported on 10/25/2016) 120 tablet 0 Not Taking at Unknown time   Allergies: No Known Allergies  History reviewed. No pertinent family history. Social History:  reports that he quit smoking about 38 years ago. His smoking use included Cigarettes, Pipe, and Cigars. He has a 3.00 pack-year smoking history. He has never used smokeless tobacco. He  reports that he drinks about 1.8 oz of alcohol per week . He reports that he does not use drugs.  ROS: A complete review of systems was performed.  All systems are negative except for pertinent findings as noted. ROS   Physical Exam:  Vital signs in last 24 hours: Temp:  [98.2 F (36.8 C)-98.9 F (37.2 C)] 98.9 F (37.2 C) (12/29 2115) Pulse Rate:  [92-138] 92 (12/29 2115) Resp:  [18-38] 18 (12/29 2115) BP: (108-173)/(49-84) 113/49 (12/29 2115) SpO2:  [96 %-100 %] 100 % (12/29 2115) Weight:  [92.5 kg (203 lb 14.8 oz)] 92.5 kg (203 lb 14.8 oz) (12/29 1219) General:  Alert and oriented, No acute distress HEENT: Normocephalic, atraumatic Neck: No JVD or lymphadenopathy Cardiovascular: Regular rate and rhythm Lungs: Regular rate and effort Abdomen: Soft, nontender, nondistended, no abdominal masses Back: No CVA tenderness Extremities: No edema Neurologic: Grossly intact GU: scrotal skin appears slightly macerated and he smells of urine. He is leaking continually. Slight phimosis. No palpable mass. Nl meatus.   Procedure: I discussed the nature r/b/a of foley catheter placement with the patient and he elected to proceed. The penis was prepped with Betadine and a 16 French Foley catheter was placed without difficulty. He drained about 300 mL of cloudy urine.  Laboratory Data:  Results for orders placed or performed during the hospital encounter of 10/25/16 (from the past 24 hour(s))  Comprehensive metabolic panel     Status: Abnormal   Collection Time: 10/25/16 12:15 PM  Result Value Ref Range   Sodium 127 (L) 135 - 145 mmol/L   Potassium 4.5 3.5 - 5.1 mmol/L   Chloride 87 (L) 101 - 111 mmol/L   CO2 26 22 - 32 mmol/L   Glucose, Bld 497 (H) 65 - 99 mg/dL   BUN 98 (H) 6 - 20 mg/dL   Creatinine, Ser 3.46 (H) 0.61 - 1.24 mg/dL   Calcium 8.2 (L) 8.9 - 10.3 mg/dL   Total Protein 6.7 6.5 - 8.1 g/dL   Albumin 3.2 (L) 3.5 - 5.0 g/dL   AST 48 (H) 15 - 41 U/L   ALT 20 17 - 63 U/L    Alkaline Phosphatase 72 38 - 126 U/L   Total Bilirubin 1.1 0.3 - 1.2 mg/dL   GFR calc non Af Amer 16 (L) >60 mL/min   GFR calc Af Amer 18 (L) >60 mL/min   Anion gap 14 5 - 15  Lipase, blood     Status: Abnormal   Collection Time: 10/25/16 12:15 PM  Result Value Ref Range   Lipase <10 (L) 11 - 51 U/L  Troponin I     Status: Abnormal   Collection Time: 10/25/16 12:15 PM  Result Value Ref Range   Troponin I 0.05 (HH) <0.03 ng/mL  CBC WITH DIFFERENTIAL     Status: Abnormal   Collection Time: 10/25/16 12:15 PM  Result Value Ref Range   WBC 11.1 (H) 3.8 - 10.6 K/uL   RBC 3.76 (L) 4.40 - 5.90 MIL/uL   Hemoglobin  11.9 (L) 13.0 - 18.0 g/dL   HCT 34.7 (L) 40.0 - 52.0 %   MCV 92.1 80.0 - 100.0 fL   MCH 31.6 26.0 - 34.0 pg   MCHC 34.3 32.0 - 36.0 g/dL   RDW 15.4 (H) 11.5 - 14.5 %   Platelets 160 150 - 440 K/uL   Neutrophils Relative % 94 %   Neutro Abs 10.4 (H) 1.4 - 6.5 K/uL   Lymphocytes Relative 3 %   Lymphs Abs 0.3 (L) 1.0 - 3.6 K/uL   Monocytes Relative 3 %   Monocytes Absolute 0.4 0.2 - 1.0 K/uL   Eosinophils Relative 0 %   Eosinophils Absolute 0.0 0 - 0.7 K/uL   Basophils Relative 0 %   Basophils Absolute 0.0 0 - 0.1 K/uL  APTT     Status: Abnormal   Collection Time: 10/25/16 12:15 PM  Result Value Ref Range   aPTT 44 (H) 24 - 36 seconds  Protime-INR     Status: Abnormal   Collection Time: 10/25/16 12:15 PM  Result Value Ref Range   Prothrombin Time 16.0 (H) 11.4 - 15.2 seconds   INR 1.27   Beta-hydroxybutyric acid     Status: Abnormal   Collection Time: 10/25/16 12:15 PM  Result Value Ref Range   Beta-Hydroxybutyric Acid 0.39 (H) 0.05 - 0.27 mmol/L  CK     Status: Abnormal   Collection Time: 10/25/16 12:15 PM  Result Value Ref Range   Total CK 654 (H) 49 - 397 U/L  TSH     Status: Abnormal   Collection Time: 10/25/16 12:15 PM  Result Value Ref Range   TSH 4.798 (H) 0.350 - 4.500 uIU/mL  Lactic acid, plasma     Status: Abnormal   Collection Time: 10/25/16 12:36 PM   Result Value Ref Range   Lactic Acid, Venous 2.8 (HH) 0.5 - 1.9 mmol/L  Type and screen [place]     Status: None   Collection Time: 10/25/16 12:36 PM  Result Value Ref Range   ABO/RH(D) A POS    Antibody Screen NEG    Sample Expiration 10/28/2016   Urinalysis, Complete w Microscopic     Status: Abnormal   Collection Time: 10/25/16 12:36 PM  Result Value Ref Range   Color, Urine AMBER (A) YELLOW   APPearance TURBID (A) CLEAR   Specific Gravity, Urine 1.012 1.005 - 1.030   pH 5.0 5.0 - 8.0   Glucose, UA >=500 (A) NEGATIVE mg/dL   Hgb urine dipstick LARGE (A) NEGATIVE   Bilirubin Urine NEGATIVE NEGATIVE   Ketones, ur NEGATIVE NEGATIVE mg/dL   Protein, ur 100 (A) NEGATIVE mg/dL   Nitrite NEGATIVE NEGATIVE   Leukocytes, UA LARGE (A) NEGATIVE   RBC / HPF 6-30 0 - 5 RBC/hpf   WBC, UA TOO NUMEROUS TO COUNT 0 - 5 WBC/hpf   Bacteria, UA MANY (A) NONE SEEN   Squamous Epithelial / LPF 0-5 (A) NONE SEEN   WBC Clumps PRESENT   Lactic acid, plasma     Status: None   Collection Time: 10/25/16  3:12 PM  Result Value Ref Range   Lactic Acid, Venous 1.9 0.5 - 1.9 mmol/L  Glucose, capillary     Status: Abnormal   Collection Time: 10/25/16  3:58 PM  Result Value Ref Range   Glucose-Capillary 348 (H) 65 - 99 mg/dL  Hemoglobin     Status: Abnormal   Collection Time: 10/25/16  7:14 PM  Result Value Ref Range  Hemoglobin 10.4 (L) 13.0 - 18.0 g/dL  Magnesium     Status: Abnormal   Collection Time: 10/25/16  7:14 PM  Result Value Ref Range   Magnesium 1.4 (L) 1.7 - 2.4 mg/dL  Glucose, capillary     Status: Abnormal   Collection Time: 10/25/16  8:39 PM  Result Value Ref Range   Glucose-Capillary 269 (H) 65 - 99 mg/dL   No results found for this or any previous visit (from the past 240 hour(s)). Creatinine:  Recent Labs  10/25/16 1215  CREATININE 3.46*   I reviewed the patient's CT scan images. Comparison made to PET scan in  2016.  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 Foley placed by MD. Follow urine output and creatinine. He may need follow-up renal ultrasound and/or ureteral stent or nephrostomy tube placement.   Pyuria - His LA improved and he's been afebrile. I don't feel he needs urgent intervention. Agree with covering for UTI. Cx pending.   I will notify Dr. Alyson Ingles who will round tomorrow. Will follow.   Jannet Calip 10/25/2016, 9:20 PM

## 2016-10-25 NOTE — H&P (Addendum)
[facility] NAME: Gary Hampton    MR#:  [id number]  DATE OF BIRTH:  January 26, 1941  DATE OF ADMISSION:  10/25/2016  PRIMARY CARE PHYSICIAN: Albina Billet, MD   REQUESTING/REFERRING PHYSICIAN: Dr. Joni Fears  CHIEF COMPLAINT:   Chief Complaint  Patient presents with  . Emesis  . Fall  . Hyperglycemia    HISTORY OF PRESENT ILLNESS:  Gary Hampton  is a 75 y.o. 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 Patient fell 4 days back and does not remember how. Had vomiting and abdominal cramping prior to this. Since his fall he has not taken his insulin or not had anything to eat or drink. Today in the emergency room he has been found to have hyperglycemia, acute renal failure. Normal hemoglobin but a melanotic stool in his colostomy bag which is heme positive. No history of peptic ulcer disease. Does not take any blood thinners or NSAIDs  PAST MEDICAL HISTORY:   Past Medical History:  Diagnosis Date  . Cancer (HCC)    Rectal  . Colon polyp   . Diabetes mellitus without complication ([place])   . Hard of hearing     PAST SURGICAL HISTORY:   Past Surgical History:  Procedure Laterality Date  . ABDOMINAL PERINEAL BOWEL RESECTION N/A 07/21/2015   Procedure: Removal of the rectum, anus, and right colon; formation of permanent colostomy;  Surgeon: Robert Bellow, MD;  Location: [facility] ORS;  Service: General;  Laterality: N/A;  . CATARACT EXTRACTION Right   . COLONOSCOPY  2013   Dr. Dionne Milo  . COLONOSCOPY N/A 03/15/2015   Procedure: COLONOSCOPY;  Surgeon: Robert Bellow, MD;  Location: [facility] ENDOSCOPY;  Service: Endoscopy;  Laterality: N/A;  . COLONOSCOPY WITH PROPOFOL N/A 08/21/2016   Procedure: COLONOSCOPY WITH PROPOFOL;  Surgeon: Robert Bellow, MD;  Location: [facility] ENDOSCOPY;   Service: Endoscopy;  Laterality: N/A;  . FLEXIBLE SIGMOIDOSCOPY N/A 03/09/2015   Procedure: FLEXIBLE SIGMOIDOSCOPY/rectal biopsy;  Surgeon: Robert Bellow, MD;  Location: [facility] ORS;  Service: General;  Laterality: N/A;  . NO PAST SURGERIES    . PORTACATH PLACEMENT Right 03/09/2015   Procedure: INSERTION PORT-A-CATH;  Surgeon: Robert Bellow, MD;  Location: [facility] ORS;  Service: General;  Laterality: Right;    SOCIAL HISTORY:   Social History  Substance Use Topics  . Smoking status: Former Smoker    Packs/day: 1.00    Years: 3.00    Types: Cigarettes, Pipe, Cigars    Quit date: 10/28/1978  . Smokeless tobacco: Never Used  . Alcohol use 1.8 oz/week    3 Shots of liquor per week    FAMILY HISTORY:  History reviewed. No pertinent family history.  Mother and father are deceased. No colon cancer in family.  DRUG ALLERGIES:  No Known Allergies  REVIEW OF SYSTEMS:   Review of Systems  Constitutional: Positive for malaise/fatigue. Negative for chills and fever.  HENT: Negative for sore throat.   Eyes: Negative for blurred vision, double vision and pain.  Respiratory: Negative for cough, hemoptysis, shortness of breath and wheezing.   Cardiovascular: Negative for chest pain, palpitations, orthopnea and leg swelling.  Gastrointestinal: Positive for abdominal pain, melena, nausea and vomiting. Negative for constipation, diarrhea and heartburn.  Genitourinary: Negative for dysuria and hematuria.  Musculoskeletal: Positive for falls. Negative for back pain and joint pain.  Skin: Negative for rash.  Neurological: Positive for loss of consciousness and weakness. Negative for sensory change, speech change, focal weakness and headaches.  Endo/Heme/Allergies: Does not bruise/bleed easily.  Psychiatric/Behavioral: Negative for depression. The patient is not nervous/anxious.     MEDICATIONS AT HOME:   Prior to Admission medications   Medication Sig Start Date End Date Taking? Authorizing  Provider  acetaminophen (TYLENOL) 325 MG tablet Take 2 tablets (650 mg total) by mouth every 6 (six) hours as needed for mild pain (or temp > 100). 07/27/15  Yes Robert Bellow, MD  diphenoxylate-atropine (LOMOTIL) 2.5-0.025 MG tablet Take by mouth every 6 (six) hours as needed for diarrhea or loose stools.   Yes Historical Provider, MD  LANTUS SOLOSTAR 100 UNIT/ML Solostar Pen Inject 23 Units into the skin daily with lunch.  03/01/16  Yes Historical Provider, MD  loperamide (IMODIUM) 2 MG capsule Take by mouth 2 (two) times daily.   Yes Historical Provider, MD  Melatonin 10 MG CAPS Take by mouth at bedtime.   Yes Historical Provider, MD  oxybutynin (DITROPAN-XL) 10 MG 24 hr tablet Take 10 mg by mouth at bedtime.   Yes Historical Provider, MD  oxyCODONE (OXYCONTIN) 10 mg 12 hr tablet Take 10 mg by mouth every 8 (eight) hours as needed.   Yes Historical Provider, MD  oxyCODONE-acetaminophen (PERCOCET) 10-650 MG tablet Take 1 tablet by mouth at bedtime and may repeat dose one time if needed. Patient not taking: Reported on 10/25/2016 01/18/16 01/17/17  Robert Bellow, MD     VITAL SIGNS:  Blood pressure (!) 157/73, pulse (!) 108, temperature 98.2 F (36.8 C), temperature source Axillary, resp. rate (!) 27, height 6\' 2"  (1.88 m), weight 92.5 kg (203 lb 14.8 oz), SpO2 96 %.  PHYSICAL EXAMINATION:  Physical Exam  GENERAL:  75 y.o.-year-old patient lying in the bed with no acute distress.  EYES: Pupils equal, round, reactive to light and accommodation. No scleral icterus. Extraocular muscles intact.  HEENT: Head atraumatic, normocephalic. Oropharynx and nasopharynx clear. No oropharyngeal erythema, moist oral mucosa  NECK:  Supple, no jugular venous distention. No thyroid enlargement, no tenderness.  LUNGS: Normal breath sounds bilaterally, no wheezing, rales, rhonchi. No use of accessory muscles of respiration.  CARDIOVASCULAR: S1, S2 normal. No murmurs, rubs, or gallops.  ABDOMEN: Soft,  nondistended. Bowel sounds present. No organomegaly or mass. Tenderness diffusely on deep palpation. No rigidity or guarding. Colostomy bag with black liquid stool. EXTREMITIES: No pedal edema, cyanosis, or clubbing. + 2 pedal & radial pulses b/l.   NEUROLOGIC: Cranial nerves II through XII are intact. Motor strength in lower extremity's 4+ over 5. Upper extremities 5 minus over 5. Symmetrical. Sensations intact. PSYCHIATRIC: The patient is alert and oriented x 3. Good affect.  SKIN: No obvious rash, lesion, or ulcer.   LABORATORY PANEL:   CBC  Recent Labs Lab 10/25/16 1215  WBC 11.1*  HGB 11.9*  HCT 34.7*  PLT 160   ------------------------------------------------------------------------------------------------------------------  Chemistries   Recent Labs Lab 10/25/16 1215  NA 127*  K 4.5  CL 87*  CO2 26  GLUCOSE 497*  BUN 98*  CREATININE 3.46*  CALCIUM 8.2*  AST 48*  ALT 20  ALKPHOS 72  BILITOT 1.1   ------------------------------------------------------------------------------------------------------------------  Cardiac Enzymes  Recent Labs Lab 10/25/16 1215  TROPONINI 0.05*   ------------------------------------------------------------------------------------------------------------------  RADIOLOGY:  Dg Chest Port 1 View  Result Date: 10/25/2016 CLINICAL DATA:  Hyperglycemia. EXAM: PORTABLE CHEST 1 VIEW COMPARISON:  Mar 09, 2015 FINDINGS: The  heart size and mediastinal contours are within normal limits. Right central venous line is unchanged. Both lungs are clear. The visualized skeletal structures are unremarkable. IMPRESSION: No active cardiopulmonary disease. Electronically Signed   By: Abelardo Diesel M.D.   On: 10/25/2016 12:47     IMPRESSION AND PLAN:   * AKI Due to dehydration from vomiting and no oral intake We will bolus normal saline and continue maintenance fluids. Monitor input and output and daily weight. Check CK level due to his fall and  being on the floor for 3 days. Consult nephrology there is no improvement. Avoid nephrotoxic medications.  * Melena Output from colostomy bag is black and positive for stool when checked in the emergency room. Etiology unclear but could be from Mallory-Weiss tear vomiting. Could also be peptic ulcer disease. Start Protonix IV. Check CT scan of the abdomen and pelvis due to elevated lactic acid. Consult GI. No significant anemia at this time. Check hemoglobin 4 hours and tomorrow morning. Transfuse if less than 7.  * Hypoglycemia due to patient missing his insulin dose for 3 days after his fall. Restart Lantus at lower dose of 14 units. Sliding scale insulin. Check HbA1c.  * History of colon cancer status post surgery and follow with cancer center.  * DVT prophylaxis with SCDs    All the records are reviewed and case discussed with ED provider. Management plans discussed with the patient, family and they are in agreement.  CODE STATUS: DNR  Discussed with patient regarding his CODE STATUS and presence of ER nurse. Patient confirms he does not want to be placed on ventilator or have any CPR or shocking. Explained DO NOT RESUSCITATE and orders placed.  TOTAL CC TIME TAKING CARE OF THIS PATIENT: 55 minutes.   Hillary Bow R M.D on 10/25/2016 at 3:53 PM  Between 7am to 6pm - Pager - [phone]  After 6pm go to [email or web address] - password [email or web address]  Office  [phone]  CC: Primary care physician; Albina Billet, MD  Note: This dictation was prepared with Dragon dictation along with smaller phrase technology. Any transcriptional errors that result from this process are unintentional.

## 2016-10-25 NOTE — Progress Notes (Signed)
Dr. Junious Silk placed and indwelling F/C in pt. 300 cloudy urine immediatly returned.  Leg strap place.

## 2016-10-25 NOTE — Progress Notes (Signed)
Called by radiologist about patient's CT A/P.  Pt found to have BL hydronephrosis with possible BL ureteral obstruction without identified stone.  Also has some right renal emphysema.  He is already on antibiotics.  On call urologist called and recommended foley catheter to drain bladder and monitor renal function for potential improvement in addition to antibiotics, as he feels the ureters are likely patent BL after reviewing patient's CT scan.  Foley ordered, and urology on board.  Jacqulyn Bath [facility] 10/25/2016, 9:17 PM

## 2016-10-25 NOTE — ED Provider Notes (Signed)
[facility] Emergency Department Provider Note  ____________________________________________  Time seen: Approximately 3:02 PM  I have reviewed the triage vital signs and the nursing notes.   HISTORY  Chief Complaint Emesis; Fall; and Hyperglycemia Level 5 caveat:  Portions of the history and physical were unable to be obtained due to the patient's acute illness    HPI Gary Hampton is a 75 y.o. male brought to the ED due to generalized weakness. Also having vomiting, fell on his floor at home and apparently was unable to care for herself in the past 4-5 days. The patient called EMS. They note that he was living in a camper which was full of garbage.  Patient denies chest pain or shortness of breath. No abdominal pain.     Past Medical History:  Diagnosis Date  . Cancer (HCC)    Rectal  . Colon polyp   . Diabetes mellitus without complication ([place])   . Hard of hearing      Patient Active Problem List   Diagnosis Date Noted  . Incontinence 04/23/2016  . Diabetes mellitus without complication ([place]) [id number]  . History of colonic polyps 06/23/2015  . Uncontrolled diabetes mellitus ([place]) 03/01/2015  . Rectal cancer ([place]) 02/22/2015     Past Surgical History:  Procedure Laterality Date  . ABDOMINAL PERINEAL BOWEL RESECTION N/A 07/21/2015   Procedure: Removal of the rectum, anus, and right colon; formation of permanent colostomy;  Surgeon: Robert Bellow, MD;  Location: [facility] ORS;  Service: General;  Laterality: N/A;  . CATARACT EXTRACTION Right   . COLONOSCOPY  2013   Dr. Dionne Milo  . COLONOSCOPY N/A 03/15/2015   Procedure: COLONOSCOPY;  Surgeon: Robert Bellow, MD;  Location: [facility] ENDOSCOPY;  Service: Endoscopy;  Laterality: N/A;  . COLONOSCOPY WITH PROPOFOL N/A 08/21/2016   Procedure: COLONOSCOPY WITH PROPOFOL;  Surgeon: Robert Bellow, MD;  Location: [facility] ENDOSCOPY;  Service: Endoscopy;  Laterality: N/A;  . FLEXIBLE SIGMOIDOSCOPY  N/A 03/09/2015   Procedure: FLEXIBLE SIGMOIDOSCOPY/rectal biopsy;  Surgeon: Robert Bellow, MD;  Location: [facility] ORS;  Service: General;  Laterality: N/A;  . NO PAST SURGERIES    . PORTACATH PLACEMENT Right 03/09/2015   Procedure: INSERTION PORT-A-CATH;  Surgeon: Robert Bellow, MD;  Location: [facility] ORS;  Service: General;  Laterality: Right;     Prior to Admission medications   Medication Sig Start Date End Date Taking? Authorizing Provider  acetaminophen (TYLENOL) 325 MG tablet Take 2 tablets (650 mg total) by mouth every 6 (six) hours as needed for mild pain (or temp > 100). 07/27/15   Robert Bellow, MD  LANTUS SOLOSTAR 100 UNIT/ML Solostar Pen Inject 22 Units into the skin daily with lunch.  03/01/16   Historical Provider, MD  loperamide (IMODIUM) 2 MG capsule Take by mouth 2 (two) times daily.    Historical Provider, MD  Melatonin 10 MG CAPS Take by mouth at bedtime.    Historical Provider, MD  oxyCODONE-acetaminophen (PERCOCET) 10-650 MG tablet Take 1 tablet by mouth at bedtime and may repeat dose one time if needed. 01/18/16 01/17/17  Robert Bellow, MD     Allergies Patient has no known allergies.   History reviewed. No pertinent family history.  Social History Social History  Substance Use Topics  . Smoking status: Former Smoker    Packs/day: 1.00    Years: 3.00    Types: Cigarettes, Pipe, Cigars    Quit date: 10/28/1978  . Smokeless tobacco: Never Used  . Alcohol use 1.8  oz/week    3 Shots of liquor per week    Review of Systems  Constitutional:   No fever or chills.  ENT:   No sore throat. No rhinorrhea. Cardiovascular:   No chest pain. Respiratory:   No dyspnea or cough. Gastrointestinal:   Negative for abdominal pain, Positive vomiting, positive black stool in colostomy.  Genitourinary:   Negative for dysuria or difficulty urinating. Musculoskeletal:   Negative for focal pain or swelling Neurological:   Negative for headaches 10-point ROS otherwise  negative.  ____________________________________________   PHYSICAL EXAM:  VITAL SIGNS: ED Triage Vitals  Enc Vitals Group     BP 10/25/16 1218 (!) 121/58     Pulse Rate 10/25/16 1218 (!) 104     Resp 10/25/16 1218 (!) 34     Temp 10/25/16 1218 98.2 F (36.8 C)     Temp Source 10/25/16 1218 Axillary     SpO2 10/25/16 1218 96 %     Weight 10/25/16 1219 203 lb 14.8 oz (92.5 kg)     Height 10/25/16 1219 6\' 2"  (1.88 m)     Head Circumference --      Peak Flow --      Pain Score --      Pain Loc --      Pain Edu? --      Excl. in [place]? --     Vital signs reviewed, nursing assessments reviewed.   Constitutional:   Alert and oriented. Ill-appearing. Eyes:   No scleral icterus. No conjunctival pallor. PERRL. EOMI.  No nystagmus. ENT   Head:   Normocephalic and atraumatic.   Nose:   No congestion/rhinnorhea. No septal hematoma   Mouth/Throat:   Dry mucous membranes, no pharyngeal erythema. No peritonsillar mass.    Neck:   No stridor. No SubQ emphysema. No meningismus. Hematological/Lymphatic/Immunilogical:   No cervical lymphadenopathy. Cardiovascular:   Tachycardia heart rate 105. Symmetric bilateral radial and DP pulses.  No murmurs.  Respiratory:   Normal respiratory effort without tachypnea nor retractions. Breath sounds are clear and equal bilaterally. No wheezes/rales/rhonchi. Gastrointestinal:   Soft and nontender. Non distended. There is no CVA tenderness.  No rebound, rigidity, or guarding. Left lower quadrant colostomy full of melanotic stool. The stool is strongly Hemoccult-positive.  Genitourinary:   deferred Musculoskeletal:   Nontender with normal range of motion in all extremities. No joint effusions.  No lower extremity tenderness.  No edema. Neurologic:   Normal speech and language.  CN 2-10 normal. Motor grossly intact. No gross focal neurologic deficits are appreciated.  Skin:    Skin is warm, dry with a firm black skin lesion at the plantar surface  of the right second toe, possibly pressure injury. Also on the left foot, the first second and third toes have blistering distally, possibly burn injury from the radiator. No inflammatory changes to suggest progressing cellulitis. Not consistent with abscess or eschar.  ____________________________________________    LABS (pertinent positives/negatives) (all labs ordered are listed, but only abnormal results are displayed) Labs Reviewed  LACTIC ACID, PLASMA - Abnormal; Notable for the following:       Result Value   Lactic Acid, Venous 2.8 (*)    All other components within normal limits  COMPREHENSIVE METABOLIC PANEL - Abnormal; Notable for the following:    Sodium 127 (*)    Chloride 87 (*)    Glucose, Bld 497 (*)    BUN 98 (*)    Creatinine, Ser 3.46 (*)    Calcium 8.2 (*)  Albumin 3.2 (*)    AST 48 (*)    GFR calc non Af Amer 16 (*)    GFR calc Af Amer 18 (*)    All other components within normal limits  LIPASE, BLOOD - Abnormal; Notable for the following:    Lipase <10 (*)    All other components within normal limits  TROPONIN I - Abnormal; Notable for the following:    Troponin I 0.05 (*)    All other components within normal limits  CBC WITH DIFFERENTIAL/PLATELET - Abnormal; Notable for the following:    WBC 11.1 (*)    RBC 3.76 (*)    Hemoglobin 11.9 (*)    HCT 34.7 (*)    RDW 15.4 (*)    Neutro Abs 10.4 (*)    Lymphs Abs 0.3 (*)    All other components within normal limits  APTT - Abnormal; Notable for the following:    aPTT 44 (*)    All other components within normal limits  PROTIME-INR - Abnormal; Notable for the following:    Prothrombin Time 16.0 (*)    All other components within normal limits  BETA-HYDROXYBUTYRIC ACID - Abnormal; Notable for the following:    Beta-Hydroxybutyric Acid 0.39 (*)    All other components within normal limits  CULTURE, BLOOD (ROUTINE X 2)  CULTURE, BLOOD (ROUTINE X 2)  URINE CULTURE  LACTIC ACID, PLASMA  URINALYSIS,  COMPLETE (UACMP) WITH MICROSCOPIC  TYPE AND SCREEN   ____________________________________________   EKG  Interpreted by me Sinus tachycardia rate 103, normal axis intervals QRS ST segments and T waves.  ____________________________________________    RADIOLOGY  Chest x-ray unremarkable  ____________________________________________   PROCEDURES Procedures CRITICAL CARE Performed by: Joni Fears, Rickita Forstner   Total critical care time: 35 minutes  Critical care time was exclusive of separately billable procedures and treating other patients.  Critical care was necessary to treat or prevent imminent or life-threatening deterioration.  Critical care was time spent personally by me on the following activities: development of treatment plan with patient and/or surrogate as well as nursing, discussions with consultants, evaluation of patient's response to treatment, examination of patient, obtaining history from patient or surrogate, ordering and performing treatments and interventions, ordering and review of laboratory studies, ordering and review of radiographic studies, pulse oximetry and re-evaluation of patient's condition.  ____________________________________________   INITIAL IMPRESSION / ASSESSMENT AND PLAN / ED COURSE  Pertinent labs & imaging results that were available during my care of the patient were reviewed by me and considered in my medical decision making (see chart for details).  Patient presents with generalized weakness, clearly dehydration, and GI bleed with melena. Suspicion for DKA as well. Patient was initiated with 2 L IV saline bolus. Check labs, type and screen for possible anemia related to GI bleed.     Clinical Course as of Oct 25 1501  Fri Oct 25, 2016  1245 Cxr = nad  [PS]    Clinical Course User Index [PS] Carrie Mew, MD    ----------------------------------------- 3:06 PM on  10/25/2016 -----------------------------------------  Case discussed with hospitalist for admission. Lactate elevated at 2.8. Hyponatremia, acute renal failure. Hemoglobin is actually nearly 12 which is excellent given the GI bleed. Elevated troponin likely due to acute critical illness, can be trended by hospitalist. Urinalysis still pending, no antibiotics given yet as the patient is not clearly septic and generally just appears severely ill from hyperglycemia and profound dehydration. IV as part given for elevated serum ketones and hyperglycemia.  Also with the findings on the distal toes, with the patient's poorly managed diabetes, patient may need a podiatry consult in hospital for possible debridement. ____________________________________________   FINAL CLINICAL IMPRESSION(S) / ED DIAGNOSES  Final diagnoses:  Acute renal failure, unspecified acute renal failure type ([place])  Hyperglycemia  Hyponatremia  Gastrointestinal hemorrhage with melena      New Prescriptions   No medications on file     Portions of this note were generated with dragon dictation software. Dictation errors may occur despite best attempts at proofreading.    Carrie Mew, MD 10/25/16 [phone]

## 2016-10-26 ENCOUNTER — Inpatient Hospital Stay (HOSPITAL_COMMUNITY)
Admit: 2016-10-26 | Discharge: 2016-10-26 | Disposition: A | Discharge status: Home / Self Care | Payer: Medicare Other | Attending: Internal Medicine | Admitting: Internal Medicine

## 2016-10-26 DIAGNOSIS — W19XXXS Unspecified fall, sequela: Secondary | ICD-10-CM

## 2016-10-26 DIAGNOSIS — E1159 Type 2 diabetes mellitus with other circulatory complications: Secondary | ICD-10-CM

## 2016-10-26 DIAGNOSIS — W19XXXA Unspecified fall, initial encounter: Secondary | ICD-10-CM

## 2016-10-26 DIAGNOSIS — I480 Paroxysmal atrial fibrillation: Secondary | ICD-10-CM

## 2016-10-26 DIAGNOSIS — R112 Nausea with vomiting, unspecified: Secondary | ICD-10-CM

## 2016-10-26 DIAGNOSIS — R1110 Vomiting, unspecified: Secondary | ICD-10-CM

## 2016-10-26 DIAGNOSIS — K921 Melena: Secondary | ICD-10-CM

## 2016-10-26 DIAGNOSIS — E119 Type 2 diabetes mellitus without complications: Secondary | ICD-10-CM

## 2016-10-26 DIAGNOSIS — N179 Acute kidney failure, unspecified: Secondary | ICD-10-CM

## 2016-10-26 DIAGNOSIS — N401 Enlarged prostate with lower urinary tract symptoms: Secondary | ICD-10-CM

## 2016-10-26 DIAGNOSIS — N319 Neuromuscular dysfunction of bladder, unspecified: Secondary | ICD-10-CM

## 2016-10-26 DIAGNOSIS — Z794 Long term (current) use of insulin: Secondary | ICD-10-CM

## 2016-10-26 DIAGNOSIS — N1330 Unspecified hydronephrosis: Secondary | ICD-10-CM

## 2016-10-26 DIAGNOSIS — R55 Syncope and collapse: Secondary | ICD-10-CM

## 2016-10-26 LAB — CBC
HEMATOCRIT: 26.3 % — AB (ref 40.0–52.0)
Hemoglobin: 9.1 g/dL — ABNORMAL LOW (ref 13.0–18.0)
MCH: 31.4 pg (ref 26.0–34.0)
MCHC: 34.5 g/dL (ref 32.0–36.0)
MCV: 91 fL (ref 80.0–100.0)
Platelets: 122 10*3/uL — ABNORMAL LOW (ref 150–440)
RBC: 2.89 MIL/uL — AB (ref 4.40–5.90)
RDW: 15.8 % — ABNORMAL HIGH (ref 11.5–14.5)
WBC: 12.3 10*3/uL — AB (ref 3.8–10.6)

## 2016-10-26 LAB — BLOOD CULTURE ID PANEL (REFLEXED)
Acinetobacter baumannii: NOT DETECTED
CANDIDA PARAPSILOSIS: NOT DETECTED
CANDIDA TROPICALIS: NOT DETECTED
CARBAPENEM RESISTANCE: NOT DETECTED
Candida albicans: NOT DETECTED
Candida glabrata: NOT DETECTED
Candida krusei: NOT DETECTED
Enterobacter cloacae complex: NOT DETECTED
Enterobacteriaceae species: DETECTED — AB
Enterococcus species: NOT DETECTED
Escherichia coli: DETECTED — AB
HAEMOPHILUS INFLUENZAE: NOT DETECTED
KLEBSIELLA PNEUMONIAE: NOT DETECTED
Klebsiella oxytoca: NOT DETECTED
Listeria monocytogenes: NOT DETECTED
Neisseria meningitidis: NOT DETECTED
PROTEUS SPECIES: NOT DETECTED
Pseudomonas aeruginosa: NOT DETECTED
STAPHYLOCOCCUS AUREUS BCID: NOT DETECTED
STAPHYLOCOCCUS SPECIES: NOT DETECTED
STREPTOCOCCUS AGALACTIAE: DETECTED — AB
STREPTOCOCCUS SPECIES: DETECTED — AB
Serratia marcescens: NOT DETECTED
Streptococcus pneumoniae: NOT DETECTED
Streptococcus pyogenes: NOT DETECTED

## 2016-10-26 LAB — COMPREHENSIVE METABOLIC PANEL
ALT: 18 U/L (ref 17–63)
ANION GAP: 9 (ref 5–15)
AST: 37 U/L (ref 15–41)
Albumin: 2.3 g/dL — ABNORMAL LOW (ref 3.5–5.0)
Alkaline Phosphatase: 53 U/L (ref 38–126)
BUN: 86 mg/dL — ABNORMAL HIGH (ref 6–20)
CHLORIDE: 99 mmol/L — AB (ref 101–111)
CO2: 25 mmol/L (ref 22–32)
Calcium: 7.1 mg/dL — ABNORMAL LOW (ref 8.9–10.3)
Creatinine, Ser: 2.68 mg/dL — ABNORMAL HIGH (ref 0.61–1.24)
GFR, EST AFRICAN AMERICAN: 25 mL/min — AB (ref >60)
GFR, EST NON AFRICAN AMERICAN: 22 mL/min — AB (ref >60)
Glucose, Bld: 215 mg/dL — ABNORMAL HIGH (ref 65–99)
POTASSIUM: 4.1 mmol/L (ref 3.5–5.1)
Sodium: 133 mmol/L — ABNORMAL LOW (ref 135–145)
Total Bilirubin: 0.6 mg/dL (ref 0.3–1.2)
Total Protein: 5 g/dL — ABNORMAL LOW (ref 6.5–8.1)

## 2016-10-26 LAB — GLUCOSE, CAPILLARY
Glucose-Capillary: 234 mg/dL — ABNORMAL HIGH (ref 65–99)
Glucose-Capillary: 145 mg/dL — ABNORMAL HIGH (ref 65–99)
Glucose-Capillary: 113 mg/dL — ABNORMAL HIGH (ref 65–99)
Glucose-Capillary: 86 mg/dL (ref 65–99)

## 2016-10-26 LAB — URINE CULTURE

## 2016-10-26 LAB — ECHOCARDIOGRAM COMPLETE
HEIGHTINCHES: 74 in
Weight: 2708.8 oz

## 2016-10-26 LAB — HEMOGLOBIN A1C
HEMOGLOBIN A1C: 7.5 % — AB (ref 4.8–5.6)
MEAN PLASMA GLUCOSE: 169 mg/dL

## 2016-10-26 MED ORDER — SODIUM CHLORIDE 0.9 % IV SOLN
1.0000 g | Freq: Two times a day (BID) | INTRAVENOUS | Status: DC
  Filled 2016-10-26: qty 1

## 2016-10-26 MED ORDER — DILTIAZEM HCL 60 MG PO TABS
60.0000 mg | ORAL_TABLET | Freq: Three times a day (TID) | ORAL | Status: DC
  Administered 2016-10-26: 60 mg via ORAL
  Filled 2016-10-26: qty 1

## 2016-10-26 MED ORDER — MEROPENEM-SODIUM CHLORIDE 1 GM/50ML IV SOLR
1.0000 g | Freq: Two times a day (BID) | INTRAVENOUS | Status: DC
  Administered 2016-10-26 – 2016-10-29 (×7): 1 g via INTRAVENOUS
  Filled 2016-10-26 (×9): qty 50

## 2016-10-26 MED ORDER — DILTIAZEM HCL 30 MG PO TABS
30.0000 mg | ORAL_TABLET | Freq: Three times a day (TID) | ORAL | Status: DC
  Administered 2016-10-27: 30 mg via ORAL
  Filled 2016-10-26 (×2): qty 1

## 2016-10-26 MED ORDER — MAGNESIUM SULFATE 4 GM/100ML IV SOLN
4.0000 g | Freq: Once | INTRAVENOUS | Status: AC
  Administered 2016-10-26: 4 g via INTRAVENOUS
  Filled 2016-10-26: qty 100

## 2016-10-26 MED ORDER — PANTOPRAZOLE SODIUM 40 MG IV SOLR
40.0000 mg | Freq: Two times a day (BID) | INTRAVENOUS | Status: DC
  Administered 2016-10-26 – 2016-10-30 (×8): 40 mg via INTRAVENOUS
  Filled 2016-10-26 (×8): qty 40

## 2016-10-26 NOTE — Progress Notes (Signed)
*  PRELIMINARY RESULTS* Echocardiogram 2D Echocardiogram has been performed.  Lavell Luster Itha Kroeker 10/26/2016, 8:32 AM

## 2016-10-26 NOTE — Progress Notes (Signed)
Subjective: Patient reports nausea and abdominal soreness. Foley draining clear urine.  Objective: Vital signs in last 24 hours: Temp:  [98.6 F (37 C)-99 F (37.2 C)] 98.6 F (37 C) (12/30 1934) Pulse Rate:  [77-90] 82 (12/30 1934) Resp:  [18-22] 18 (12/30 1934) BP: (99-116)/(39-66) 106/41 (12/30 1934) SpO2:  [93 %-96 %] 96 % (12/30 1934) Weight:  [76.8 kg (169 lb 4.8 oz)] 76.8 kg (169 lb 4.8 oz) (12/30 0331)  Intake/Output from previous day: 12/29 0701 - 12/30 0700 In: 950 [I.V.:950] Out: 500 [Urine:500] Intake/Output this shift: Total I/O In: -  Out: 250 [Urine:250]  Physical Exam:  General:alert, cooperative and appears stated age GI: soft, non tender, normal bowel sounds, no palpable masses, no organomegaly, no inguinal hernia Male genitalia: not done no bladder distension noted Penis: normal, no lesions Extremities: extremities normal, atraumatic, no cyanosis or edema  Lab Results:  Recent Labs  10/25/16 1215 10/25/16 1914 10/26/16 0310  HGB 11.9* 10.4* 9.1*  HCT 34.7*  --  26.3*   BMET  Recent Labs  10/25/16 1215 10/26/16 0310  NA 127* 133*  K 4.5 4.1  CL 87* 99*  CO2 26 25  GLUCOSE 497* 215*  BUN 98* 86*  CREATININE 3.46* 2.68*  CALCIUM 8.2* 7.1*    Recent Labs  10/25/16 1215  INR 1.27   No results for input(s): LABURIN in the last 72 hours. Results for orders placed or performed during the hospital encounter of 10/25/16  Urine culture     Status: Abnormal   Collection Time: 10/25/16 12:36 PM  Result Value Ref Range Status   Specimen Description URINE, RANDOM  Final   Special Requests NONE  Final   Culture (A)  Final    >=100,000 COLONIES/mL GROUP B STREP(S.AGALACTIAE)ISOLATED TESTING AGAINST S. AGALACTIAE NOT ROUTINELY PERFORMED DUE TO PREDICTABILITY OF AMP/PEN/VAN SUSCEPTIBILITY. Performed at [facility]    Report Status 10/26/2016 FINAL  Final  Blood Culture (routine x 2)     Status: None (Preliminary result)    Collection Time: 10/25/16 12:54 PM  Result Value Ref Range Status   Specimen Description BLOOD RIGHT ARM  Final   Special Requests   Final    BOTTLES DRAWN AEROBIC AND ANAEROBIC ANA14ML AER13ML   Culture  Setup Time   Final    Organism ID to follow GRAM POSITIVE COCCI GRAM NEGATIVE RODS IN BOTH AEROBIC AND ANAEROBIC BOTTLES CONFIRMED BY [facility] CRITICAL RESULT CALLED TO, READ BACK BY AND VERIFIED WITH: MATT MCBANE AT [place] ON 10/26/16 [facility]    Culture GRAM POSITIVE COCCI GRAM NEGATIVE RODS  Final   Report Status PENDING  Incomplete  Blood Culture (routine x 2)     Status: None (Preliminary result)   Collection Time: 10/25/16 12:54 PM  Result Value Ref Range Status   Specimen Description BLOOD LEFT ARM  Final   Special Requests BOTTLES DRAWN AEROBIC AND ANAEROBIC AER7ML ANA8ML  Final   Culture  Setup Time   Final    GRAM NEGATIVE RODS GRAM POSITIVE COCCI IN BOTH AEROBIC AND ANAEROBIC BOTTLES CRITICAL VALUE NOTED.  VALUE IS CONSISTENT WITH PREVIOUSLY REPORTED AND CALLED VALUE. CONFIRMED BY [facility]    Culture GRAM NEGATIVE RODS GRAM POSITIVE COCCI  Final   Report Status PENDING  Incomplete  Blood Culture ID Panel (Reflexed)     Status: Abnormal   Collection Time: 10/25/16 12:54 PM  Result Value Ref Range Status   Enterococcus species NOT DETECTED NOT DETECTED Final   Listeria monocytogenes NOT DETECTED NOT  DETECTED Final   Staphylococcus species NOT DETECTED NOT DETECTED Final   Staphylococcus aureus NOT DETECTED NOT DETECTED Final   Streptococcus species DETECTED (A) NOT DETECTED Final    Comment: CRITICAL RESULT CALLED TO, READ BACK BY AND VERIFIED WITH: MATT MCBANE AT 0159 ON 10/26/16 RWW    Streptococcus agalactiae DETECTED (A) NOT DETECTED Final    Comment: CRITICAL RESULT CALLED TO, READ BACK BY AND VERIFIED WITH: MATT MCBANE AT 0159 ON 10/26/16 RWW    Streptococcus pneumoniae NOT DETECTED NOT DETECTED Final   Streptococcus pyogenes NOT DETECTED NOT DETECTED Final   Acinetobacter  baumannii NOT DETECTED NOT DETECTED Final   Enterobacteriaceae species DETECTED (A) NOT DETECTED Final    Comment: CRITICAL RESULT CALLED TO, READ BACK BY AND VERIFIED WITH: MATT MCBANE AT 0159 ON 10/26/16 RWW    Enterobacter cloacae complex NOT DETECTED NOT DETECTED Final   Escherichia coli DETECTED (A) NOT DETECTED Final    Comment: CRITICAL RESULT CALLED TO, READ BACK BY AND VERIFIED WITH: MATT MCBANE AT 0159 ON 10/26/16 RWW    Klebsiella oxytoca NOT DETECTED NOT DETECTED Final   Klebsiella pneumoniae NOT DETECTED NOT DETECTED Final   Proteus species NOT DETECTED NOT DETECTED Final   Serratia marcescens NOT DETECTED NOT DETECTED Final   Carbapenem resistance NOT DETECTED NOT DETECTED Final   Haemophilus influenzae NOT DETECTED NOT DETECTED Final   Neisseria meningitidis NOT DETECTED NOT DETECTED Final   Pseudomonas aeruginosa NOT DETECTED NOT DETECTED Final   Candida albicans NOT DETECTED NOT DETECTED Final   Candida glabrata NOT DETECTED NOT DETECTED Final   Candida krusei NOT DETECTED NOT DETECTED Final   Candida parapsilosis NOT DETECTED NOT DETECTED Final   Candida tropicalis NOT DETECTED NOT DETECTED Final    Studies/Results: Ct Abdomen Pelvis Wo Contrast  Addendum Date: 10/25/2016   ADDENDUM REPORT: 10/25/2016 21:03 ADDENDUM: The salient findings were discussed with Dr. Jannifer Franklin on 10/25/16 at 9:02 pm. Electronically Signed   By: Nolon Nations M.D.   On: 10/25/2016 21:03   Result Date: 10/25/2016 CLINICAL DATA:  75 y.o. male brought to the ED due to generalized weakness. Also having vomiting, fell on his floor at home and apparently was unable to care for self in the past 4-5 days. 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 EXAM: CT ABDOMEN AND PELVIS WITHOUT CONTRAST TECHNIQUE: Multidetector CT imaging of the abdomen and pelvis was performed following the standard protocol without IV contrast. COMPARISON:  06/13/2015 FINDINGS:  Lower chest: No pulmonary nodules, pleural effusions, or infiltrates. Heart is mildly enlarged. Mitral annulus calcification is present. Hepatobiliary: Gallbladder is distended. Liver is homogeneous and normal in noncontrast appearance. Pancreas: Unremarkable. No pancreatic ductal dilatation or surrounding inflammatory changes. Spleen: Normal in size without focal abnormality. Adrenals/Urinary Tract: The adrenal glands are normal. The right kidney is enlarged in there is significant perinephric stranding bilaterally. Dilatation of the collecting system and ureters bilaterally without obvious stone or source of obstruction. There is a small amount of gas within the subcapsular region of the right kidney consistent with emphysematous pyelonephritis. No intrarenal stones or suspicious renal masses are identified. The urinary bladder has a thickened wall. There is significant stranding surrounding the urinary bladder wall. Stomach/Bowel: Stomach and small bowel loops are normal in appearance. The patient has a left mid abdominal colostomy following resection of the rectum. Residual colon appears normal. Vascular/Lymphatic: Significant atherosclerotic calcification of the abdominal aorta. Reproductive: Prostatic calcifications are present. Other: There is significant presacral soft  tissue measuring 2.9 x 8.0 cm and suspicious for recurrence in the surgical bed. The ureters and seminal vesicles appear somewhat posteriorly displaced in may be involved by tumor or inflammation. There is a small amount of fluid in the pelvis. Musculoskeletal: Degenerative changes are identified primarily at L4-5. No suspicious lytic or blastic lesions are identified. IMPRESSION: 1. Enlarged kidneys, perinephric stranding, and dilated collecting systems bilaterally. Obstruction may be related to bladder wall thickening, displacement of the ureters, or tumor involvement of the ureter/bladder. No stones are identified as a cause for  obstruction. 2. Gas beneath the right renal capsule, suspicious for emphysematous pyelonephritis. 3. Soft tissue density in the presacral region and pelvic wall, suspicious for tumor recurrence in this patient with a history of rectal cancer. 4. There is somewhat posterior displacement of the distal ureters and seminal vesicles as well, raising question of tumor involvement. 5. Bladder wall thickening is marked and may be related to inflammatory/infectious process or tumor. 6. Distended gallbladder. 7. Left mid abdominal colostomy following AP resection of the rectum. Electronically Signed: By: Nolon Nations M.D. On: 10/25/2016 20:55   Dg Chest Port 1 View  Result Date: 10/25/2016 CLINICAL DATA:  Hyperglycemia. EXAM: PORTABLE CHEST 1 VIEW COMPARISON:  Mar 09, 2015 FINDINGS: The heart size and mediastinal contours are within normal limits. Right central venous line is unchanged. Both lungs are clear. The visualized skeletal structures are unremarkable. IMPRESSION: No active cardiopulmonary disease. Electronically Signed   By: Abelardo Diesel M.D.   On: 10/25/2016 12:47    Assessment/Plan: 1. BPh with LUTS, urinary retention: Continue indwelling foley  2. 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 The patient will need to be continue don broad spectrum antibiotics and he should have a repeat Scan in 48 hours.  Urology to continue to follow   LOS: 1 day   Nicolette Bang 10/26/2016, 10:27 PM

## 2016-10-26 NOTE — Consult Note (Signed)
Referring Provider: Dr. Vianne Bulls Primary Care Physician:  Albina Billet, MD Primary Gastroenterologist:  Althia Forts  Reason for Consultation:  GI bleed  HPI: Gary Hampton is a 75 y.o. male with septic shock and bacteremia and Afib with RVR who has a history of rectal cancer with neoadjuvant chemoradiation and s/p right hemicolectomy and abdominal perineal resection with permanent end colostomy placed in September 2016. He had black tarry stools in his ostomy bag prior to admit but unable to tell me any history of how long it has occurred. He is somnolent and only able to answer basic questions. Says his abdomen is "sore." No known history of peptic ulcer disease.   Past Medical History:  Diagnosis Date  . Cancer (HCC)    Rectal  . Colon polyp   . Diabetes mellitus without complication ([place])   . Hard of hearing     Past Surgical History:  Procedure Laterality Date  . ABDOMINAL PERINEAL BOWEL RESECTION N/A 07/21/2015   Procedure: Removal of the rectum, anus, and right colon; formation of permanent colostomy;  Surgeon: Robert Bellow, MD;  Location: [facility] ORS;  Service: General;  Laterality: N/A;  . CATARACT EXTRACTION Right   . COLONOSCOPY  2013   Dr. Dionne Milo  . COLONOSCOPY N/A 03/15/2015   Procedure: COLONOSCOPY;  Surgeon: Robert Bellow, MD;  Location: [facility];  Service: Endoscopy;  Laterality: N/A;  . COLONOSCOPY WITH PROPOFOL N/A 08/21/2016   Procedure: COLONOSCOPY WITH PROPOFOL;  Surgeon: Robert Bellow, MD;  Location: [facility] ENDOSCOPY;  Service: Endoscopy;  Laterality: N/A;  . FLEXIBLE SIGMOIDOSCOPY N/A 03/09/2015   Procedure: FLEXIBLE SIGMOIDOSCOPY/rectal biopsy;  Surgeon: Robert Bellow, MD;  Location: [facility] ORS;  Service: General;  Laterality: N/A;  . NO PAST SURGERIES    . PORTACATH PLACEMENT Right 03/09/2015   Procedure: INSERTION PORT-A-CATH;  Surgeon: Robert Bellow, MD;  Location: [facility] ORS;  Service: General;  Laterality: Right;    Prior to Admission  medications   Medication Sig Start Date End Date Taking? Authorizing Provider  acetaminophen (TYLENOL) 325 MG tablet Take 2 tablets (650 mg total) by mouth every 6 (six) hours as needed for mild pain (or temp > 100). 07/27/15  Yes Robert Bellow, MD  diphenoxylate-atropine (LOMOTIL) 2.5-0.025 MG tablet Take by mouth every 6 (six) hours as needed for diarrhea or loose stools.   Yes Historical Provider, MD  LANTUS SOLOSTAR 100 UNIT/ML Solostar Pen Inject 23 Units into the skin daily with lunch.  03/01/16  Yes Historical Provider, MD  loperamide (IMODIUM) 2 MG capsule Take by mouth 2 (two) times daily.   Yes Historical Provider, MD  Melatonin 10 MG CAPS Take by mouth at bedtime.   Yes Historical Provider, MD  oxybutynin (DITROPAN-XL) 10 MG 24 hr tablet Take 10 mg by mouth at bedtime.   Yes Historical Provider, MD  oxyCODONE (OXYCONTIN) 10 mg 12 hr tablet Take 10 mg by mouth every 8 (eight) hours as needed.   Yes Historical Provider, MD  oxyCODONE-acetaminophen (PERCOCET) 10-650 MG tablet Take 1 tablet by mouth at bedtime and may repeat dose one time if needed. Patient not taking: Reported on 10/25/2016 01/18/16 01/17/17  Robert Bellow, MD    Scheduled Meds: . diltiazem  30 mg Oral Q8H  . insulin aspart  0-15 Units Subcutaneous TID WC  . insulin aspart  0-5 Units Subcutaneous QHS  . loperamide  2 mg Oral BID  . meropenem  1 g Intravenous Q12H  . metoprolol tartrate  50 mg Oral  BID  . oxybutynin  10 mg Oral QHS  . oxyCODONE  10 mg Oral Q12H  . sodium chloride flush  3 mL Intravenous Q12H   Continuous Infusions: . sodium chloride 100 mL/hr at 10/26/16 1000   PRN Meds:.acetaminophen **OR** acetaminophen, albuterol, diphenoxylate-atropine, HYDROcodone-acetaminophen, labetalol, ondansetron **OR** ondansetron (ZOFRAN) IV, polyethylene glycol  Allergies as of 10/25/2016  . (No Known Allergies)    History reviewed. No pertinent family history.  Social History   Social History  . Marital  status: Divorced    Spouse name: N/A  . Number of children: N/A  . Years of education: N/A   Occupational History  . Not on file.   Social History Main Topics  . Smoking status: Former Smoker    Packs/day: 1.00    Years: 3.00    Types: Cigarettes, Pipe, Cigars    Quit date: 10/28/1978  . Smokeless tobacco: Never Used  . Alcohol use 1.8 oz/week    3 Shots of liquor per week  . Drug use: No  . Sexual activity: Not on file   Other Topics Concern  . Not on file   Social History Narrative  . No narrative on file    Review of Systems: All negative except as stated above in HPI.  Physical Exam: Vital signs: Vitals:   10/26/16 0331 10/26/16 1215  BP: (!) 104/45 (!) 99/39  Pulse: 90 77  Resp: 18 (!) 22  Temp: 99 F (37.2 C) 98.9 F (37.2 C)   Last BM Date: 10/26/16 General:   Somnolent, elderly, frail, disheveled HEENT: atraumatic, anicteric sclera Lungs:  Clear throughout to auscultation.   No wheezes, crackles, or rhonchi. No acute distress. Heart:  Regular rate and rhythm; no murmurs, clicks, rubs,  or gallops. Abdomen: soft, right-sided tenderness with guarding, nondistended, +BS, ostomy bag intact with black stool present Rectal:  Deferred Ext: no edema Skin: dry, no rash  GI:  Lab Results:  Recent Labs  10/25/16 1215 10/25/16 1914 10/26/16 0310  WBC 11.1*  --  12.3*  HGB 11.9* 10.4* 9.1*  HCT 34.7*  --  26.3*  PLT 160  --  122*   BMET  Recent Labs  10/25/16 1215 10/26/16 0310  NA 127* 133*  K 4.5 4.1  CL 87* 99*  CO2 26 25  GLUCOSE 497* 215*  BUN 98* 86*  CREATININE 3.46* 2.68*  CALCIUM 8.2* 7.1*   LFT  Recent Labs  10/26/16 0310  PROT 5.0*  ALBUMIN 2.3*  AST 37  ALT 18  ALKPHOS 53  BILITOT 0.6   PT/INR  Recent Labs  10/25/16 1215  LABPROT 16.0*  INR 1.27     Studies/Results: Ct Abdomen Pelvis Wo Contrast  Addendum Date: 10/25/2016   ADDENDUM REPORT: 10/25/2016 21:03 ADDENDUM: The salient findings were discussed with  Dr. Jannifer Franklin on 10/25/16 at 9:02 pm. Electronically Signed   By: Nolon Nations M.D.   On: 10/25/2016 21:03   Result Date: 10/25/2016 CLINICAL DATA:  75 y.o. male brought to the ED due to generalized weakness. Also having vomiting, fell on his floor at home and apparently was unable to care for self in the past 4-5 days. 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 EXAM: CT ABDOMEN AND PELVIS WITHOUT CONTRAST TECHNIQUE: Multidetector CT imaging of the abdomen and pelvis was performed following the standard protocol without IV contrast. COMPARISON:  06/13/2015 FINDINGS: Lower chest: No pulmonary nodules, pleural effusions, or infiltrates. Heart is mildly enlarged. Mitral annulus calcification is  present. Hepatobiliary: Gallbladder is distended. Liver is homogeneous and normal in noncontrast appearance. Pancreas: Unremarkable. No pancreatic ductal dilatation or surrounding inflammatory changes. Spleen: Normal in size without focal abnormality. Adrenals/Urinary Tract: The adrenal glands are normal. The right kidney is enlarged in there is significant perinephric stranding bilaterally. Dilatation of the collecting system and ureters bilaterally without obvious stone or source of obstruction. There is a small amount of gas within the subcapsular region of the right kidney consistent with emphysematous pyelonephritis. No intrarenal stones or suspicious renal masses are identified. The urinary bladder has a thickened wall. There is significant stranding surrounding the urinary bladder wall. Stomach/Bowel: Stomach and small bowel loops are normal in appearance. The patient has a left mid abdominal colostomy following resection of the rectum. Residual colon appears normal. Vascular/Lymphatic: Significant atherosclerotic calcification of the abdominal aorta. Reproductive: Prostatic calcifications are present. Other: There is significant presacral soft tissue measuring 2.9 x 8.0  cm and suspicious for recurrence in the surgical bed. The ureters and seminal vesicles appear somewhat posteriorly displaced in may be involved by tumor or inflammation. There is a small amount of fluid in the pelvis. Musculoskeletal: Degenerative changes are identified primarily at L4-5. No suspicious lytic or blastic lesions are identified. IMPRESSION: 1. Enlarged kidneys, perinephric stranding, and dilated collecting systems bilaterally. Obstruction may be related to bladder wall thickening, displacement of the ureters, or tumor involvement of the ureter/bladder. No stones are identified as a cause for obstruction. 2. Gas beneath the right renal capsule, suspicious for emphysematous pyelonephritis. 3. Soft tissue density in the presacral region and pelvic wall, suspicious for tumor recurrence in this patient with a history of rectal cancer. 4. There is somewhat posterior displacement of the distal ureters and seminal vesicles as well, raising question of tumor involvement. 5. Bladder wall thickening is marked and may be related to inflammatory/infectious process or tumor. 6. Distended gallbladder. 7. Left mid abdominal colostomy following AP resection of the rectum. Electronically Signed: By: Nolon Nations M.D. On: 10/25/2016 20:55   Dg Chest Port 1 View  Result Date: 10/25/2016 CLINICAL DATA:  Hyperglycemia. EXAM: PORTABLE CHEST 1 VIEW COMPARISON:  Mar 09, 2015 FINDINGS: The heart size and mediastinal contours are within normal limits. Right central venous line is unchanged. Both lungs are clear. The visualized skeletal structures are unremarkable. IMPRESSION: No active cardiopulmonary disease. Electronically Signed   By: Abelardo Diesel M.D.   On: 10/25/2016 12:47    Impression/Plan: 75 yo with multiple medical problems admitted with septic shock with bacteremia (on Meropenem) who has black fluid in his ostomy bag. Question peptic ulcer source vs. gut ischemia (doubt) vs. mucosal bleeding. Hgb 9.1  (10.4). Mildly hypotensive at 99/39 earlier this afternoon. I do not think he is having an active GI bleed that warrants an EGD right now but he likely will need an EGD prior to discharge due to the anticoagulation needed for his AFib (per Cards recs). With his altered mental status I think he would need elective intubation prior to EGD so will manage conservatively for now and when his mental status improves consider doing an EGD at that time since I do not think he is actively bleeding. Start Protonix 40 mg IV Q 12 hours. Will follow.    LOS: 1 day   [place] C.  10/26/2016, 5:14 PM

## 2016-10-26 NOTE — Consult Note (Signed)
[facility]     Reason for Consult: Sepsis, bacteremia   Referring Physician: Governor Specking Date of Admission:  10/25/2016   Active Problems:   Acute renal failure ([place])   A-fib ([place])   Gastrointestinal hemorrhage with melena   Vomiting   Fall   Diabetes mellitus ([place])   HPI: Gary Hampton is a 75 y.o. male admitted with a fall, abd pain, vomiting, and found to have bacteremia with Grp B strep and E coli as well as UCx with Grp B strep. CT scan showed empyematous pyelonephritis and dilated collecting system with thickened bladder wall.   He has a hx of IDDM, rectal cancer with colectomy and Chemo radiation.    Past Medical History:  Diagnosis Date  . Cancer (HCC)    Rectal  . Colon polyp   . Diabetes mellitus without complication ([place])   . Hard of hearing    Past Surgical History:  Procedure Laterality Date  . ABDOMINAL PERINEAL BOWEL RESECTION N/A 07/21/2015   Procedure: Removal of the rectum, anus, and right colon; formation of permanent colostomy;  Surgeon: Robert Bellow, MD;  Location: [facility] ORS;  Service: General;  Laterality: N/A;  . CATARACT EXTRACTION Right   . COLONOSCOPY  2013   Dr. Dionne Milo  . COLONOSCOPY N/A 03/15/2015   Procedure: COLONOSCOPY;  Surgeon: Robert Bellow, MD;  Location[facility] ENDOSCOPY;  Service: Endoscopy;  Laterality: N/A;  . COLONOSCOPY WITH PROPOFOL N/A 08/21/2016   Procedure: COLONOSCOPY WITH PROPOFOL;  Surgeon: Robert Bellow, MD;  Location: [facility] ENDOSCOPY;  Service: Endoscopy;  Laterality: N/A;  . FLEXIBLE SIGMOIDOSCOPY N/A 03/09/2015   Procedure: FLEXIBLE SIGMOIDOSCOPY/rectal biopsy;  Surgeon: Robert Bellow, MD;  Location: [facility] ORS;  Service: General;  Laterality: N/A;  . NO PAST SURGERIES    . PORTACATH PLACEMENT Right 03/09/2015   Procedure: INSERTION PORT-A-CATH;  Surgeon: Robert Bellow, MD;  Location: [facility] ORS;  Service: General;  Laterality: Right;   Social History  Substance Use Topics  . Smoking  status: Former Smoker    Packs/day: 1.00    Years: 3.00    Types: Cigarettes, Pipe, Cigars    Quit date: 10/28/1978  . Smokeless tobacco: Never Used  . Alcohol use 1.8 oz/week    3 Shots of liquor per week   History reviewed. No pertinent family history.  Allergies: No Known Allergies  Current antibiotics: Antibiotics Given (last 72 hours)    Date/Time Action Medication Dose Rate   10/25/16 1829 Given   cefTRIAXone (ROCEPHIN) 1-3.74 GM-% IVPB 1,000 mg    10/25/16 2200 Given   cefTRIAXone (ROCEPHIN) IVPB 1 g 1 g 100 mL/hr   10/26/16 0603 Given   meropenem (MERREM) IVPB SOLR 1 g 1 g 100 mL/hr   10/26/16 1715 Given   meropenem (MERREM) IVPB SOLR 1 g 1 g 100 mL/hr      MEDICATIONS: . diltiazem  30 mg Oral Q8H  . insulin aspart  0-15 Units Subcutaneous TID WC  . insulin aspart  0-5 Units Subcutaneous QHS  . loperamide  2 mg Oral BID  . meropenem  1 g Intravenous Q12H  . metoprolol tartrate  50 mg Oral BID  . oxybutynin  10 mg Oral QHS  . oxyCODONE  10 mg Oral Q12H  . pantoprazole (PROTONIX) IV  40 mg Intravenous Q12H  . sodium chloride flush  3 mL Intravenous Q12H   Review of Systems - 11 systems reviewed and negative per HPI  OBJECTIVE: Temp:  [98.6 F (  37 C)-99 F (37.2 C)] 98.6 F (37 C) (12/30 1934) Pulse Rate:  [77-92] 82 (12/30 1934) Resp:  [18-35] 18 (12/30 1934) BP: (99-119)/(39-66) 106/41 (12/30 1934) SpO2:  [93 %-100 %] 96 % (12/30 1934) Weight:  [76.6 kg (168 lb 14.4 oz)-76.8 kg (169 lb 4.8 oz)] 76.8 kg (169 lb 4.8 oz) (12/30 0331) Physical Exam  Constitutional: He is oriented to person, place, and time. He appears well-developed and well-nourished. Frail HENT:  Mouth/Throat: Oropharynx is clear and dry. No oropharyngeal exudate.  Cardiovascular: Normal rate, regular rhythm and normal heart sounds.   Pulmonary/Chest: Effort normal and breath sounds normal. No respiratory distress. He has no wheezes.  Abdominal: Soft. Bowel sounds are normal. He exhibits no  distension. Colostomy site with black stool Lymphadenopathy:  He has no cervical adenopathy.  Neurological: He is alert and oriented to person, place, and time.  Skin: Skin is warm and dry. No rash noted. No erythema.  Psychiatric: He has a normal mood and affect. His behavior is normal.   LABS: Results for orders placed or performed during the hospital encounter of 10/25/16 (from the past 48 hour(s))  Comprehensive metabolic panel     Status: Abnormal   Collection Time: 10/25/16 12:15 PM  Result Value Ref Range   Sodium 127 (L) 135 - 145 mmol/L   Potassium 4.5 3.5 - 5.1 mmol/L   Chloride 87 (L) 101 - 111 mmol/L   CO2 26 22 - 32 mmol/L   Glucose, Bld 497 (H) 65 - 99 mg/dL   BUN 98 (H) 6 - 20 mg/dL    Comment: RESULT CONFIRMED BY MANUAL DILUTION. TCH.   Creatinine, Ser 3.46 (H) 0.61 - 1.24 mg/dL   Calcium 8.2 (L) 8.9 - 10.3 mg/dL   Total Protein 6.7 6.5 - 8.1 g/dL   Albumin 3.2 (L) 3.5 - 5.0 g/dL   AST 48 (H) 15 - 41 U/L   ALT 20 17 - 63 U/L   Alkaline Phosphatase 72 38 - 126 U/L   Total Bilirubin 1.1 0.3 - 1.2 mg/dL   GFR calc non Af Amer 16 (L) >60 mL/min   GFR calc Af Amer 18 (L) >60 mL/min    Comment: (NOTE) The eGFR has been calculated using the CKD EPI equation. This calculation has not been validated in all clinical situations. eGFR's persistently <60 mL/min signify possible Chronic Kidney Disease.    Anion gap 14 5 - 15  Lipase, blood     Status: Abnormal   Collection Time: 10/25/16 12:15 PM  Result Value Ref Range   Lipase <10 (L) 11 - 51 U/L  Troponin I     Status: Abnormal   Collection Time: 10/25/16 12:15 PM  Result Value Ref Range   Troponin I 0.05 (HH) <0.03 ng/mL    Comment: CRITICAL RESULT CALLED TO, READ BACK BY AND VERIFIED WITH STEVEN JONES AT 1751 10/25/16 DAS   CBC WITH DIFFERENTIAL     Status: Abnormal   Collection Time: 10/25/16 12:15 PM  Result Value Ref Range   WBC 11.1 (H) 3.8 - 10.6 K/uL   RBC 3.76 (L) 4.40 - 5.90 MIL/uL   Hemoglobin 11.9  (L) 13.0 - 18.0 g/dL   HCT 34.7 (L) 40.0 - 52.0 %   MCV 92.1 80.0 - 100.0 fL   MCH 31.6 26.0 - 34.0 pg   MCHC 34.3 32.0 - 36.0 g/dL   RDW 15.4 (H) 11.5 - 14.5 %   Platelets 160 150 - 440 K/uL   Neutrophils Relative %  94 %   Neutro Abs 10.4 (H) 1.4 - 6.5 K/uL   Lymphocytes Relative 3 %   Lymphs Abs 0.3 (L) 1.0 - 3.6 K/uL   Monocytes Relative 3 %   Monocytes Absolute 0.4 0.2 - 1.0 K/uL   Eosinophils Relative 0 %   Eosinophils Absolute 0.0 0 - 0.7 K/uL   Basophils Relative 0 %   Basophils Absolute 0.0 0 - 0.1 K/uL  APTT     Status: Abnormal   Collection Time: 10/25/16 12:15 PM  Result Value Ref Range   aPTT 44 (H) 24 - 36 seconds    Comment:        IF BASELINE aPTT IS ELEVATED, SUGGEST PATIENT RISK ASSESSMENT BE USED TO DETERMINE APPROPRIATE ANTICOAGULANT THERAPY.   Protime-INR     Status: Abnormal   Collection Time: 10/25/16 12:15 PM  Result Value Ref Range   Prothrombin Time 16.0 (H) 11.4 - 15.2 seconds   INR 1.27   Beta-hydroxybutyric acid     Status: Abnormal   Collection Time: 10/25/16 12:15 PM  Result Value Ref Range   Beta-Hydroxybutyric Acid 0.39 (H) 0.05 - 0.27 mmol/L  CK     Status: Abnormal   Collection Time: 10/25/16 12:15 PM  Result Value Ref Range   Total CK 654 (H) 49 - 397 U/L  Hemoglobin A1c     Status: Abnormal   Collection Time: 10/25/16 12:15 PM  Result Value Ref Range   Hgb A1c MFr Bld 7.5 (H) 4.8 - 5.6 %    Comment: (NOTE)         Pre-diabetes: 5.7 - 6.4         Diabetes: >6.4         Glycemic control for adults with diabetes: <7.0    Mean Plasma Glucose 169 mg/dL    Comment: (NOTE) Performed At: [facility] [place], [place] [id number] Lindon Romp MD WU:[id number]   TSH     Status: Abnormal   Collection Time: 10/25/16 12:15 PM  Result Value Ref Range   TSH 4.798 (H) 0.350 - 4.500 uIU/mL    Comment: Performed by a 3rd Generation assay with a functional sensitivity of <=0.01 uIU/mL.  Lactic acid, plasma      Status: Abnormal   Collection Time: 10/25/16 12:36 PM  Result Value Ref Range   Lactic Acid, Venous 2.8 (HH) 0.5 - 1.9 mmol/L    Comment: CRITICAL RESULT CALLED TO, READ BACK BY AND VERIFIED WITH STEVEN JONES AT 9562 10/25/16 DAS   Urine culture     Status: Abnormal   Collection Time: 10/25/16 12:36 PM  Result Value Ref Range   Specimen Description URINE, RANDOM    Special Requests NONE    Culture (A)     >=100,000 COLONIES/mL GROUP B STREP(S.AGALACTIAE)ISOLATED TESTING AGAINST S. AGALACTIAE NOT ROUTINELY PERFORMED DUE TO PREDICTABILITY OF AMP/PEN/VAN SUSCEPTIBILITY. Performed at [facility]    Report Status 10/26/2016 FINAL   Type and screen [place]     Status: None   Collection Time: 10/25/16 12:36 PM  Result Value Ref Range   ABO/RH(D) A POS    Antibody Screen NEG    Sample Expiration 10/28/2016   Urinalysis, Complete w Microscopic     Status: Abnormal   Collection Time: 10/25/16 12:36 PM  Result Value Ref Range   Color, Urine AMBER (A) YELLOW    Comment: BIOCHEMICALS MAY BE AFFECTED BY COLOR   APPearance TURBID (A) CLEAR   Specific Gravity, Urine  1.012 1.005 - 1.030   pH 5.0 5.0 - 8.0   Glucose, UA >=500 (A) NEGATIVE mg/dL   Hgb urine dipstick LARGE (A) NEGATIVE   Bilirubin Urine NEGATIVE NEGATIVE   Ketones, ur NEGATIVE NEGATIVE mg/dL   Protein, ur 100 (A) NEGATIVE mg/dL   Nitrite NEGATIVE NEGATIVE   Leukocytes, UA LARGE (A) NEGATIVE   RBC / HPF 6-30 0 - 5 RBC/hpf   WBC, UA TOO NUMEROUS TO COUNT 0 - 5 WBC/hpf   Bacteria, UA MANY (A) NONE SEEN   Squamous Epithelial / LPF 0-5 (A) NONE SEEN   WBC Clumps PRESENT   Blood Culture (routine x 2)     Status: None (Preliminary result)   Collection Time: 10/25/16 12:54 PM  Result Value Ref Range   Specimen Description BLOOD RIGHT ARM    Special Requests      BOTTLES DRAWN AEROBIC AND ANAEROBIC ANA14ML AER13ML   Culture  Setup Time      Organism ID to follow [place] IN BOTH AEROBIC AND ANAEROBIC BOTTLES CONFIRMED BY RWW CRITICAL RESULT CALLED TO, READ BACK BY AND VERIFIED WITH: MATT MCBANE AT [place] ON 10/26/16 RWW    Culture GRAM POSITIVE COCCI GRAM NEGATIVE RODS    Report Status PENDING   Blood Culture (routine x 2)     Status: None (Preliminary result)   Collection Time: 10/25/16 12:54 PM  Result Value Ref Range   Specimen Description BLOOD LEFT ARM    Special Requests BOTTLES DRAWN AEROBIC AND ANAEROBIC AER7ML ANA8ML    Culture  Setup Time      GRAM NEGATIVE RODS GRAM POSITIVE COCCI IN BOTH AEROBIC AND ANAEROBIC BOTTLES CRITICAL VALUE NOTED.  VALUE IS CONSISTENT WITH PREVIOUSLY REPORTED AND CALLED VALUE. CONFIRMED BY PMH    Culture GRAM NEGATIVE RODS GRAM POSITIVE COCCI    Report Status PENDING   Blood Culture ID Panel (Reflexed)     Status: Abnormal   Collection Time: 10/25/16 12:54 PM  Result Value Ref Range   Enterococcus species NOT DETECTED NOT DETECTED   Listeria monocytogenes NOT DETECTED NOT DETECTED   Staphylococcus species NOT DETECTED NOT DETECTED   Staphylococcus aureus NOT DETECTED NOT DETECTED   Streptococcus species DETECTED (A) NOT DETECTED    Comment: CRITICAL RESULT CALLED TO, READ BACK BY AND VERIFIED WITH: MATT MCBANE AT 0159 ON 10/26/16 RWW    Streptococcus agalactiae DETECTED (A) NOT DETECTED    Comment: CRITICAL RESULT CALLED TO, READ BACK BY AND VERIFIED WITH: MATT MCBANE AT 0159 ON 10/26/16 RWW    Streptococcus pneumoniae NOT DETECTED NOT DETECTED   Streptococcus pyogenes NOT DETECTED NOT DETECTED   Acinetobacter baumannii NOT DETECTED NOT DETECTED   Enterobacteriaceae species DETECTED (A) NOT DETECTED    Comment: CRITICAL RESULT CALLED TO, READ BACK BY AND VERIFIED WITH: MATT MCBANE AT 0159 ON 10/26/16 RWW    Enterobacter cloacae complex NOT DETECTED NOT DETECTED   Escherichia coli DETECTED (A) NOT DETECTED    Comment: CRITICAL RESULT CALLED TO, READ BACK BY AND VERIFIED WITH: MATT MCBANE AT  0159 ON 10/26/16 RWW    Klebsiella oxytoca NOT DETECTED NOT DETECTED   Klebsiella pneumoniae NOT DETECTED NOT DETECTED   Proteus species NOT DETECTED NOT DETECTED   Serratia marcescens NOT DETECTED NOT DETECTED   Carbapenem resistance NOT DETECTED NOT DETECTED   Haemophilus influenzae NOT DETECTED NOT DETECTED   Neisseria meningitidis NOT DETECTED NOT DETECTED   Pseudomonas aeruginosa NOT DETECTED NOT DETECTED   Candida  albicans NOT DETECTED NOT DETECTED   Candida glabrata NOT DETECTED NOT DETECTED   Candida krusei NOT DETECTED NOT DETECTED   Candida parapsilosis NOT DETECTED NOT DETECTED   Candida tropicalis NOT DETECTED NOT DETECTED  Lactic acid, plasma     Status: None   Collection Time: 10/25/16  3:12 PM  Result Value Ref Range   Lactic Acid, Venous 1.9 0.5 - 1.9 mmol/L  Glucose, capillary     Status: Abnormal   Collection Time: 10/25/16  3:58 PM  Result Value Ref Range   Glucose-Capillary 348 (H) 65 - 99 mg/dL  Hemoglobin     Status: Abnormal   Collection Time: 10/25/16  7:14 PM  Result Value Ref Range   Hemoglobin 10.4 (L) 13.0 - 18.0 g/dL  Magnesium     Status: Abnormal   Collection Time: 10/25/16  7:14 PM  Result Value Ref Range   Magnesium 1.4 (L) 1.7 - 2.4 mg/dL  Glucose, capillary     Status: Abnormal   Collection Time: 10/25/16  8:39 PM  Result Value Ref Range   Glucose-Capillary 269 (H) 65 - 99 mg/dL  Comprehensive metabolic panel     Status: Abnormal   Collection Time: 10/26/16  3:10 AM  Result Value Ref Range   Sodium 133 (L) 135 - 145 mmol/L   Potassium 4.1 3.5 - 5.1 mmol/L   Chloride 99 (L) 101 - 111 mmol/L   CO2 25 22 - 32 mmol/L   Glucose, Bld 215 (H) 65 - 99 mg/dL   BUN 86 (H) 6 - 20 mg/dL   Creatinine, Ser 2.68 (H) 0.61 - 1.24 mg/dL   Calcium 7.1 (L) 8.9 - 10.3 mg/dL   Total Protein 5.0 (L) 6.5 - 8.1 g/dL   Albumin 2.3 (L) 3.5 - 5.0 g/dL   AST 37 15 - 41 U/L   ALT 18 17 - 63 U/L   Alkaline Phosphatase 53 38 - 126 U/L   Total Bilirubin 0.6 0.3 -  1.2 mg/dL   GFR calc non Af Amer 22 (L) >60 mL/min   GFR calc Af Amer 25 (L) >60 mL/min    Comment: (NOTE) The eGFR has been calculated using the CKD EPI equation. This calculation has not been validated in all clinical situations. eGFR's persistently <60 mL/min signify possible Chronic Kidney Disease.    Anion gap 9 5 - 15  CBC     Status: Abnormal   Collection Time: 10/26/16  3:10 AM  Result Value Ref Range   WBC 12.3 (H) 3.8 - 10.6 K/uL   RBC 2.89 (L) 4.40 - 5.90 MIL/uL   Hemoglobin 9.1 (L) 13.0 - 18.0 g/dL   HCT 26.3 (L) 40.0 - 52.0 %   MCV 91.0 80.0 - 100.0 fL   MCH 31.4 26.0 - 34.0 pg   MCHC 34.5 32.0 - 36.0 g/dL   RDW 15.8 (H) 11.5 - 14.5 %   Platelets 122 (L) 150 - 440 K/uL  Glucose, capillary     Status: Abnormal   Collection Time: 10/26/16  7:49 AM  Result Value Ref Range   Glucose-Capillary 234 (H) 65 - 99 mg/dL  Glucose, capillary     Status: Abnormal   Collection Time: 10/26/16 12:13 PM  Result Value Ref Range   Glucose-Capillary 145 (H) 65 - 99 mg/dL  Glucose, capillary     Status: Abnormal   Collection Time: 10/26/16  4:41 PM  Result Value Ref Range   Glucose-Capillary 113 (H) 65 - 99 mg/dL   No components  found for: ESR, C REACTIVE PROTEIN MICRO: Recent Results (from the past 720 hour(s))  Urine culture     Status: Abnormal   Collection Time: 10/25/16 12:36 PM  Result Value Ref Range Status   Specimen Description URINE, RANDOM  Final   Special Requests NONE  Final   Culture (A)  Final    >=100,000 COLONIES/mL GROUP B STREP(S.AGALACTIAE)ISOLATED TESTING AGAINST S. AGALACTIAE NOT ROUTINELY PERFORMED DUE TO PREDICTABILITY OF AMP/PEN/VAN SUSCEPTIBILITY. Performed at [facility]    Report Status 10/26/2016 FINAL  Final  Blood Culture (routine x 2)     Status: None (Preliminary result)   Collection Time: 10/25/16 12:54 PM  Result Value Ref Range Status   Specimen Description BLOOD RIGHT ARM  Final   Special Requests   Final    BOTTLES DRAWN  AEROBIC AND ANAEROBIC ANA14ML AER13ML   Culture  Setup Time   Final    Organism ID to follow GRAM POSITIVE COCCI GRAM NEGATIVE RODS IN BOTH AEROBIC AND ANAEROBIC BOTTLES CONFIRMED BY RWW CRITICAL RESULT CALLED TO, READ BACK BY AND VERIFIED WITH: MATT MCBANE AT [place] ON 10/26/16 RWW    Culture GRAM POSITIVE COCCI GRAM NEGATIVE RODS  Final   Report Status PENDING  Incomplete  Blood Culture (routine x 2)     Status: None (Preliminary result)   Collection Time: 10/25/16 12:54 PM  Result Value Ref Range Status   Specimen Description BLOOD LEFT ARM  Final   Special Requests BOTTLES DRAWN AEROBIC AND ANAEROBIC AER7ML ANA8ML  Final   Culture  Setup Time   Final    GRAM NEGATIVE RODS GRAM POSITIVE COCCI IN BOTH AEROBIC AND ANAEROBIC BOTTLES CRITICAL VALUE NOTED.  VALUE IS CONSISTENT WITH PREVIOUSLY REPORTED AND CALLED VALUE. CONFIRMED BY PMH    Culture GRAM NEGATIVE RODS GRAM POSITIVE COCCI  Final   Report Status PENDING  Incomplete  Blood Culture ID Panel (Reflexed)     Status: Abnormal   Collection Time: 10/25/16 12:54 PM  Result Value Ref Range Status   Enterococcus species NOT DETECTED NOT DETECTED Final   Listeria monocytogenes NOT DETECTED NOT DETECTED Final   Staphylococcus species NOT DETECTED NOT DETECTED Final   Staphylococcus aureus NOT DETECTED NOT DETECTED Final   Streptococcus species DETECTED (A) NOT DETECTED Final    Comment: CRITICAL RESULT CALLED TO, READ BACK BY AND VERIFIED WITH: MATT MCBANE AT 0159 ON 10/26/16 RWW    Streptococcus agalactiae DETECTED (A) NOT DETECTED Final    Comment: CRITICAL RESULT CALLED TO, READ BACK BY AND VERIFIED WITH: MATT MCBANE AT 0159 ON 10/26/16 RWW    Streptococcus pneumoniae NOT DETECTED NOT DETECTED Final   Streptococcus pyogenes NOT DETECTED NOT DETECTED Final   Acinetobacter baumannii NOT DETECTED NOT DETECTED Final   Enterobacteriaceae species DETECTED (A) NOT DETECTED Final    Comment: CRITICAL RESULT CALLED TO, READ BACK BY AND  VERIFIED WITH: MATT MCBANE AT 0159 ON 10/26/16 RWW    Enterobacter cloacae complex NOT DETECTED NOT DETECTED Final   Escherichia coli DETECTED (A) NOT DETECTED Final    Comment: CRITICAL RESULT CALLED TO, READ BACK BY AND VERIFIED WITH: MATT MCBANE AT 0159 ON 10/26/16 RWW    Klebsiella oxytoca NOT DETECTED NOT DETECTED Final   Klebsiella pneumoniae NOT DETECTED NOT DETECTED Final   Proteus species NOT DETECTED NOT DETECTED Final   Serratia marcescens NOT DETECTED NOT DETECTED Final   Carbapenem resistance NOT DETECTED NOT DETECTED Final   Haemophilus influenzae NOT DETECTED NOT DETECTED Final  Neisseria meningitidis NOT DETECTED NOT DETECTED Final   Pseudomonas aeruginosa NOT DETECTED NOT DETECTED Final   Candida albicans NOT DETECTED NOT DETECTED Final   Candida glabrata NOT DETECTED NOT DETECTED Final   Candida krusei NOT DETECTED NOT DETECTED Final   Candida parapsilosis NOT DETECTED NOT DETECTED Final   Candida tropicalis NOT DETECTED NOT DETECTED Final    IMAGING: Ct Abdomen Pelvis Wo Contrast  Addendum Date: 10/25/2016   ADDENDUM REPORT: 10/25/2016 21:03 ADDENDUM: The salient findings were discussed with Dr. Jannifer Franklin on 10/25/16 at 9:02 pm. Electronically Signed   By: Nolon Nations M.D.   On: 10/25/2016 21:03   Result Date: 10/25/2016 CLINICAL DATA:  75 y.o. male brought to the ED due to generalized weakness. Also having vomiting, fell on his floor at home and apparently was unable to care for self in the past 4-5 days. 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 EXAM: CT ABDOMEN AND PELVIS WITHOUT CONTRAST TECHNIQUE: Multidetector CT imaging of the abdomen and pelvis was performed following the standard protocol without IV contrast. COMPARISON:  06/13/2015 FINDINGS: Lower chest: No pulmonary nodules, pleural effusions, or infiltrates. Heart is mildly enlarged. Mitral annulus calcification is present. Hepatobiliary: Gallbladder is  distended. Liver is homogeneous and normal in noncontrast appearance. Pancreas: Unremarkable. No pancreatic ductal dilatation or surrounding inflammatory changes. Spleen: Normal in size without focal abnormality. Adrenals/Urinary Tract: The adrenal glands are normal. The right kidney is enlarged in there is significant perinephric stranding bilaterally. Dilatation of the collecting system and ureters bilaterally without obvious stone or source of obstruction. There is a small amount of gas within the subcapsular region of the right kidney consistent with emphysematous pyelonephritis. No intrarenal stones or suspicious renal masses are identified. The urinary bladder has a thickened wall. There is significant stranding surrounding the urinary bladder wall. Stomach/Bowel: Stomach and small bowel loops are normal in appearance. The patient has a left mid abdominal colostomy following resection of the rectum. Residual colon appears normal. Vascular/Lymphatic: Significant atherosclerotic calcification of the abdominal aorta. Reproductive: Prostatic calcifications are present. Other: There is significant presacral soft tissue measuring 2.9 x 8.0 cm and suspicious for recurrence in the surgical bed. The ureters and seminal vesicles appear somewhat posteriorly displaced in may be involved by tumor or inflammation. There is a small amount of fluid in the pelvis. Musculoskeletal: Degenerative changes are identified primarily at L4-5. No suspicious lytic or blastic lesions are identified. IMPRESSION: 1. Enlarged kidneys, perinephric stranding, and dilated collecting systems bilaterally. Obstruction may be related to bladder wall thickening, displacement of the ureters, or tumor involvement of the ureter/bladder. No stones are identified as a cause for obstruction. 2. Gas beneath the right renal capsule, suspicious for emphysematous pyelonephritis. 3. Soft tissue density in the presacral region and pelvic wall, suspicious for  tumor recurrence in this patient with a history of rectal cancer. 4. There is somewhat posterior displacement of the distal ureters and seminal vesicles as well, raising question of tumor involvement. 5. Bladder wall thickening is marked and may be related to inflammatory/infectious process or tumor. 6. Distended gallbladder. 7. Left mid abdominal colostomy following AP resection of the rectum. Electronically Signed: By: Nolon Nations M.D. On: 10/25/2016 20:55   Dg Chest Port 1 View  Result Date: 10/25/2016 CLINICAL DATA:  Hyperglycemia. EXAM: PORTABLE CHEST 1 VIEW COMPARISON:  Mar 09, 2015 FINDINGS: The heart size and mediastinal contours are within normal limits. Right central venous line is unchanged. Both lungs are clear. The visualized  skeletal structures are unremarkable. IMPRESSION: No active cardiopulmonary disease. Electronically Signed   By: Abelardo Diesel M.D.   On: 10/25/2016 12:47    Assessment:   Gary Hampton is a 75 y.o. male with GB strep and E coli bacteremia likely from urinary source. He has been seen by urology and had a foley placed due to urinary retention.  CT shows pyelo vs renal abscess.   Recommendations Continue meropenem pending sensitivities of the E coli. Will need to follow with urology and get follow up imaging Likely will need several weeks of abx - hopefully will have oral options.   Thank you very much for allowing me to participate in the care of this patient. Please call with questions.   Cheral Marker. Ola Spurr, MD

## 2016-10-26 NOTE — Progress Notes (Signed)
PT Cancellation Note  Patient Details Name: Gary Hampton MRN: [id number] DOB: [birth year]   Cancelled Treatment:    Reason Eval/Treat Not Completed: Fatigue/lethargy limiting ability to participate. PT spoke to RN at 1046 prior to attempting evaluation. Patient confused and very lethargic at present time. Will check back later for mobility assessment if time permits.   Dorice Lamas, PT, DPT 10/26/2016, 10:51 AM

## 2016-10-26 NOTE — Progress Notes (Signed)
PHARMACY - PHYSICIAN COMMUNICATION CRITICAL VALUE ALERT - BLOOD CULTURE IDENTIFICATION (BCID)  Results for orders placed or performed during the hospital encounter of 10/25/16  Blood Culture ID Panel (Reflexed) (Collected: 10/25/2016 12:54 PM)  Result Value Ref Range   Enterococcus species NOT DETECTED NOT DETECTED   Listeria monocytogenes NOT DETECTED NOT DETECTED   Staphylococcus species NOT DETECTED NOT DETECTED   Staphylococcus aureus NOT DETECTED NOT DETECTED   Streptococcus species DETECTED (A) NOT DETECTED   Streptococcus agalactiae DETECTED (A) NOT DETECTED   Streptococcus pneumoniae NOT DETECTED NOT DETECTED   Streptococcus pyogenes NOT DETECTED NOT DETECTED   Acinetobacter baumannii NOT DETECTED NOT DETECTED   Enterobacteriaceae species DETECTED (A) NOT DETECTED   Enterobacter cloacae complex NOT DETECTED NOT DETECTED   Escherichia coli DETECTED (A) NOT DETECTED   Klebsiella oxytoca NOT DETECTED NOT DETECTED   Klebsiella pneumoniae NOT DETECTED NOT DETECTED   Proteus species NOT DETECTED NOT DETECTED   Serratia marcescens NOT DETECTED NOT DETECTED   Carbapenem resistance NOT DETECTED NOT DETECTED   Haemophilus influenzae NOT DETECTED NOT DETECTED   Neisseria meningitidis NOT DETECTED NOT DETECTED   Pseudomonas aeruginosa NOT DETECTED NOT DETECTED   Candida albicans NOT DETECTED NOT DETECTED   Candida glabrata NOT DETECTED NOT DETECTED   Candida krusei NOT DETECTED NOT DETECTED   Candida parapsilosis NOT DETECTED NOT DETECTED   Candida tropicalis NOT DETECTED NOT DETECTED    Name of physician (or Provider) ContactedMarcille Blanco  Changes to prescribed antibiotics required: Changed to meropenem  Yarethzi Branan S 10/26/2016  4:08 AM

## 2016-10-26 NOTE — Progress Notes (Signed)
New order from Dr. Jannifer Franklin, for PO Cardizem 60mg  and to discontinue Cardizem drip.

## 2016-10-26 NOTE — Progress Notes (Signed)
Pt. Has converted to NSR with HR in 80's-90's. Dr. Jannifer Franklin notified. Awaiting orders for PO Cardizem and to discontinue drip.

## 2016-10-26 NOTE — Consult Note (Signed)
Cardiology Consultation Note  Patient ID: Gary Hampton, MRN: [id number], DOB/AGE: 1941/02/24 75 y.o. Admit date: 10/25/2016   Date of Consult: 10/26/2016 Primary Physician: Albina Billet, MD Primary Cardiologist: New to [facility]  Chief Complaint: Anorexia, fall, hyperglycemia Reason for Consult: Atrial fibrillation with RVR Physician requesting consult: Dr. Darvin Neighbours   HPI: 75 y.o. male with h/o history of rectal cancer, colectomy, insulin-dependent diabetes, presenting with anorexia, hyperglycemia, melena, weakness. Cardiology consult in for atrial fibrillation that developed yesterday afternoon lasting for several hours.   Notes indicating patient fell several days ago, had generalized weakness, vomiting, unable to take care of himself for the past 4-5 days. 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   Lab work this morning showing creatinine 2.68, BUN 86 , improved from yesterday creatinine 3.46   cardiac enzymes negative 1 Hemoglobin dropping from 12 down to 10, now 9.1, possibly dilutional   Past Medical History:  Diagnosis Date  . Cancer (HCC)    Rectal  . Colon polyp   . Diabetes mellitus without complication ([place])   . Hard of hearing       Most Recent Cardiac Studies: Echocardiogram showing essentially normal LV function, normal right heart pressures    Surgical History:   Past Surgical History:  Procedure Laterality Date  . ABDOMINAL PERINEAL BOWEL RESECTION N/A 07/21/2015   Procedure: Removal of the rectum, anus, and right colon; formation of permanent colostomy;  Surgeon: Robert Bellow, MD;  Location: [facility] ORS;  Service: General;  Laterality: N/A;  . CATARACT EXTRACTION Right   . COLONOSCOPY  2013   Dr. Dionne Milo  . COLONOSCOPY N/A 03/15/2015   Procedure: COLONOSCOPY;  Surgeon: Robert Bellow, MD;  Location: [facility] ENDOSCOPY;  Service: Endoscopy;  Laterality: N/A;  . COLONOSCOPY WITH PROPOFOL N/A 08/21/2016   Procedure: COLONOSCOPY WITH PROPOFOL;  Surgeon: Robert Bellow, MD;  Location: [facility] ENDOSCOPY;  Service: Endoscopy;  Laterality: N/A;  . FLEXIBLE SIGMOIDOSCOPY N/A 03/09/2015   Procedure: FLEXIBLE SIGMOIDOSCOPY/rectal biopsy;  Surgeon: Robert Bellow, MD;  Location: [facility] ORS;  Service: General;  Laterality: N/A;  . NO PAST SURGERIES    . PORTACATH PLACEMENT Right 03/09/2015   Procedure: INSERTION PORT-A-CATH;  Surgeon: Robert Bellow, MD;  Location: [facility] ORS;  Service: General;  Laterality: Right;     Home Meds: Prior to Admission medications   Medication Sig Start Date End Date Taking? Authorizing Provider  acetaminophen (TYLENOL) 325 MG tablet Take 2 tablets (650 mg total) by mouth every 6 (six) hours as needed for mild pain (or temp > 100). 07/27/15  Yes Robert Bellow, MD  diphenoxylate-atropine (LOMOTIL) 2.5-0.025 MG tablet Take by mouth every 6 (six) hours as needed for diarrhea or loose stools.   Yes Historical Provider, MD  LANTUS SOLOSTAR 100 UNIT/ML Solostar Pen Inject 23 Units into the skin daily with lunch.  03/01/16  Yes Historical Provider, MD  loperamide (IMODIUM) 2 MG capsule  Take by mouth 2 (two) times daily.   Yes Historical Provider, MD  Melatonin 10 MG CAPS Take by mouth at bedtime.   Yes Historical Provider, MD  oxybutynin (DITROPAN-XL) 10 MG 24 hr tablet Take 10 mg by mouth at bedtime.   Yes Historical Provider, MD   oxyCODONE (OXYCONTIN) 10 mg 12 hr tablet Take 10 mg by mouth every 8 (eight) hours as needed.   Yes Historical Provider, MD  oxyCODONE-acetaminophen (PERCOCET) 10-650 MG tablet Take 1 tablet by mouth at bedtime and may repeat dose one time if needed. Patient not taking: Reported on 10/25/2016 01/18/16 01/17/17  Robert Bellow, MD    Inpatient Medications:  . diltiazem  30 mg Oral Q8H  . insulin aspart  0-15 Units Subcutaneous TID WC  . insulin aspart  0-5 Units Subcutaneous QHS  . insulin glargine  14 Units Subcutaneous Daily  . loperamide  2 mg Oral BID  . meropenem  1 g Intravenous Q12H  . metoprolol tartrate  50 mg Oral BID  . oxybutynin  10 mg Oral QHS  . oxyCODONE  10 mg Oral Q12H  . pantoprazole (PROTONIX) IV  40 mg Intravenous Q12H  . sodium chloride flush  3 mL Intravenous Q12H   . sodium chloride 100 mL/hr at 10/26/16 1000    Allergies: No Known Allergies  Social History   Social History  . Marital status: Divorced    Spouse name: N/A  . Number of children: N/A  . Years of education: N/A   Occupational History  . Not on file.   Social History Main Topics  . Smoking status: Former Smoker    Packs/day: 1.00    Years: 3.00    Types: Cigarettes, Pipe, Cigars    Quit date: 10/28/1978  . Smokeless tobacco: Never Used  . Alcohol use 1.8 oz/week    3 Shots of liquor per week  . Drug use: No  . Sexual activity: Not on file   Other Topics Concern  . Not on file   Social History Narrative  . No narrative on file     History reviewed. No pertinent family history.   Review of Systems: Review of Systems  Unable to perform ROS: Critical illness  Patient is lethargic, minimally conversant   Labs: CBC  Recent Labs  10/25/16 1215 10/25/16 1914 10/26/16 0310  WBC 11.1*  --  12.3*  NEUTROABS 10.4*  --   --   HGB 11.9* 10.4* 9.1*  HCT 34.7*  --  26.3*  MCV 92.1  --  91.0  PLT 160  --  123XX123*   Basic Metabolic Panel  Recent Labs  10/25/16 1215  10/25/16 1914 10/26/16 0310  NA 127*  --  133*  K 4.5  --  4.1  CL 87*  --  99*  CO2 26  --  25  GLUCOSE 497*  --  215*  BUN 98*  --  86*  CREATININE 3.46*  --  2.68*  CALCIUM 8.2*  --  7.1*  MG  --  1.4*  --    Liver Function Tests  Recent Labs  10/25/16 1215 10/26/16 0310  AST 48* 37  ALT 20 18  ALKPHOS 72 53  BILITOT 1.1 0.6  PROT 6.7 5.0*  ALBUMIN 3.2* 2.3*    Recent Labs  10/25/16 1215  LIPASE <10*   Cardiac Enzymes  Recent Labs  10/25/16 1215  CKTOTAL 654*  TROPONINI 0.05*   BNP Invalid input(s): POCBNP D-Dimer No results for input(s):  DDIMER in the last 72 hours. Hemoglobin A1C  Recent Labs  10/25/16 1215  HGBA1C 7.5*   Fasting Lipid Panel No results for input(s): CHOL, HDL, LDLCALC, TRIG, CHOLHDL, LDLDIRECT in the last 72 hours. Thyroid Function Tests  Recent Labs  10/25/16 1215  TSH 4.798*    Radiology/Studies:  Ct Abdomen Pelvis Wo Contrast  Addendum Date: 10/25/2016   ADDENDUM REPORT: 10/25/2016 21:03 ADDENDUM: The salient findings were discussed with Dr. Jannifer Franklin on 10/25/16 at 9:02 pm. Electronically Signed   By: Nolon Nations M.D.   On: 10/25/2016 21:03   Result Date: 10/25/2016 CLINICAL DATA:  75 y.o. male brought to the ED due to generalized weakness. Also having vomiting, fell on his floor at home and apparently was unable to care for self in the past 4-5 days. 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 EXAM: CT ABDOMEN AND PELVIS WITHOUT CONTRAST TECHNIQUE: Multidetector CT imaging of the abdomen and pelvis was performed following the standard protocol without IV contrast. COMPARISON:  06/13/2015 FINDINGS: Lower chest: No pulmonary nodules, pleural effusions, or infiltrates. Heart is mildly enlarged. Mitral annulus calcification is present. Hepatobiliary: Gallbladder is distended. Liver is homogeneous and normal in noncontrast appearance. Pancreas: Unremarkable. No pancreatic ductal  dilatation or surrounding inflammatory changes. Spleen: Normal in size without focal abnormality. Adrenals/Urinary Tract: The adrenal glands are normal. The right kidney is enlarged in there is significant perinephric stranding bilaterally. Dilatation of the collecting system and ureters bilaterally without obvious stone or source of obstruction. There is a small amount of gas within the subcapsular region of the right kidney consistent with emphysematous pyelonephritis. No intrarenal stones or suspicious renal masses are identified. The urinary bladder has a thickened wall. There is significant stranding surrounding the urinary bladder wall. Stomach/Bowel: Stomach and small bowel loops are normal in appearance. The patient has a left mid abdominal colostomy following resection of the rectum. Residual colon appears normal. Vascular/Lymphatic: Significant atherosclerotic calcification of the abdominal aorta. Reproductive: Prostatic calcifications are present. Other: There is significant presacral soft tissue measuring 2.9 x 8.0 cm and suspicious for recurrence in the surgical bed. The ureters and seminal vesicles appear somewhat posteriorly displaced in may be involved by tumor or inflammation. There is a small amount of fluid in the pelvis. Musculoskeletal: Degenerative changes are identified primarily at L4-5. No suspicious lytic or blastic lesions are identified. IMPRESSION: 1. Enlarged kidneys, perinephric stranding, and dilated collecting systems bilaterally. Obstruction may be related to bladder wall thickening, displacement of the ureters, or tumor involvement of the ureter/bladder. No stones are identified as a cause for obstruction. 2. Gas beneath the right renal capsule, suspicious for emphysematous pyelonephritis. 3. Soft tissue density in the presacral region and pelvic wall, suspicious for tumor recurrence in this patient with a history of rectal cancer. 4. There is somewhat posterior displacement of the  distal ureters and seminal vesicles as well, raising question of tumor involvement. 5. Bladder wall thickening is marked and may be related to inflammatory/infectious process or tumor. 6. Distended gallbladder. 7. Left mid abdominal colostomy following AP resection of the rectum. Electronically Signed: By: Nolon Nations M.D. On: 10/25/2016 20:55   Dg Chest Port 1 View  Result Date: 10/25/2016 CLINICAL DATA:  Hyperglycemia. EXAM: PORTABLE CHEST 1 VIEW COMPARISON:  Mar 09, 2015 FINDINGS: The heart size and mediastinal contours are within normal limits. Right central venous line is unchanged. Both lungs are clear. The visualized skeletal structures are unremarkable. IMPRESSION: No active cardiopulmonary disease. Electronically Signed  By: Abelardo Diesel M.D.   On: 10/25/2016 12:47    EKG: EKG shows normal sinus rhythm with no significant ST changes, nonspecific T wave changes lateral leads  Telemetry reviewed by myself showing normal sinus rhythm with APCs, several hours of atrial fibrillation late yesterday afternoon lasting approximately 45 hours before converting to normal sinus rhythm around 9 PM   EKG lab work, chest x-ray, echocardiogram reviewed independently by myself  Weights: Filed Weights   10/25/16 1219 10/25/16 2115 10/26/16 0331  Weight: 203 lb 14.8 oz (92.5 kg) 168 lb 14.4 oz (76.6 kg) 169 lb 4.8 oz (76.8 kg)     Physical Exam: Telemetry this morning showing normal sinus rhythm Blood pressure (!) 104/45, pulse 90, temperature 99 F (37.2 C), temperature source Oral, resp. rate 18, height 6\' 2"  (1.88 m), weight 169 lb 4.8 oz (76.8 kg), SpO2 93 %. Body mass index is 21.74 kg/m. GEN: Well nourished, well developed, in no acute distress, lethargic HEENT: Grossly normal.  Neck: Supple, no JVD, carotid bruits, or masses. Cardiac: RRR, no murmurs, rubs, or gallops. No clubbing, cyanosis, edema.  Radials/DP/PT 2+ and equal bilaterally.  Respiratory:  Respirations regular and  unlabored, clear to auscultation bilaterally. GI: Soft, nontender, nondistended, BS + x 4. MS: no deformity or atrophy. Skin: warm and dry, no rash. Neuro:  Unable to test as patient is very lethargic  Psych: lethargic, minimally conversant     Assessment and Plan:   1). Paroxysmal atrial fibrillation several hours noted on monitor yesterday late afternoon into the evening   possibly brought on by bilateral hydronephrosis , improved with placement of Foley catheter  resolved with Cardizem IV push, Not on anticoagulation given drop in hematocrit, melena GI has been consulted --For now would hold anticoagulation until cleared by GI CHADS VASC at least 34 (age, DM, vascular  Disease seen on CT scan) We'll decrease diltiazem dose down to 30 mg every 8 given low blood pressure No structural issues predisposing him to atrial fibrillation, normal left atrial size, normal LV function Would continue to monitor as you're doing on telemetry for now  2) acute renal failure, ATN Suspect secondary to bilateral hydronephrosis Slow improvement over the past 24 hours On gentle hydration  3) diabetes type 2, insulin-dependent Unable to care for himself past 4-5 days,  Glucose levels 350 on arrival Hemoglobin A1c 7.5  4) melena GI consult pending Drop in blood count, likely dilutional If cleared by GI, could potentially start anticoagulation for atrial fibrillation  5) encephalopathy Urinary tract infection, off his insulin Elevated lactic acid currently on antibiotics    Total encounter time more than 110 minutes  Greater than 50% was spent in counseling and coordination of care with the patient   Signed, Esmond Plants, MD, Ph.D [facility] 10/26/2016

## 2016-10-26 NOTE — Progress Notes (Signed)
[place] [facility] [place] NAME: Gary Hampton    MR#:  [id number]  DATE OF BIRTH:  [birth year]  SUBJECTIVE: Admitted for atrial fibrillation with RVR, acute on chronic renal failure, melena. Patient lethargic this morning. Found to have sepsis with bacteremia with positive blood cultures. Patient hypotensive this morning, heart rate improved. Ablation with RVR with rate up to 1 50 bpm yesterday.   CHIEF COMPLAINT:   Chief Complaint  Patient presents with  . Emesis  . Fall  . Hyperglycemia    REVIEW OF SYSTEMS:   Review of Systems  Unable to perform ROS: Acuity of condition     DRUG ALLERGIES:  No Known Allergies  VITALS:  Blood pressure (!) 99/39, pulse 77, temperature 98.9 F (37.2 C), temperature source Oral, resp. rate (!) 22, height 6\' 2"  (1.88 m), weight 76.8 kg (169 lb 4.8 oz), SpO2 94 %.  PHYSICAL EXAMINATION:  GENERAL:  75 y.o.-year-old patient lying in the bed with no acute distress.  EYES: Pupils equal, round, reactive to light and accommodation. No scleral icterus. Extraocular muscles intact.  HEENT: Head atraumatic, normocephalic. Oropharynx and nasopharynx clear.  NECK:  Supple, no jugular venous distention. No thyroid enlargement, no tenderness.  LUNGS: Normal breath sounds bilaterally, no wheezing, rales,rhonchi or crepitation. No use of accessory muscles of respiration.  CARDIOVASCULAR: S1, S2 normal. No murmurs, rubs, or gallops.  ABDOMEN: Soft, nontender, nondistended. Bowel sounds present. No organomegaly or mass.  EXTREMITIES: No pedal edema, cyanosis, or clubbing.  NEUROLOGIC: Cranial nerves II through XII are intact. Muscle strength 5/5 in all extremities. Sensation intact. Gait not checked.  PSYCHIATRIC: The patient is alert and oriented x 3.  SKIN: No obvious rash, lesion, or ulcer.    LABORATORY PANEL:   CBC  Recent Labs Lab 10/26/16 0310  WBC 12.3*  HGB 9.1*  HCT 26.3*  PLT 122*    ------------------------------------------------------------------------------------------------------------------  Chemistries   Recent Labs Lab 10/25/16 1914 10/26/16 0310  NA  --  133*  K  --  4.1  CL  --  99*  CO2  --  25  GLUCOSE  --  215*  BUN  --  86*  CREATININE  --  2.68*  CALCIUM  --  7.1*  MG 1.4*  --   AST  --  37  ALT  --  18  ALKPHOS  --  53  BILITOT  --  0.6   ------------------------------------------------------------------------------------------------------------------  Cardiac Enzymes  Recent Labs Lab 10/25/16 1215  TROPONINI 0.05*   ------------------------------------------------------------------------------------------------------------------  RADIOLOGY:  Ct Abdomen Pelvis Wo Contrast  Addendum Date: 10/25/2016   ADDENDUM REPORT: 10/25/2016 21:03 ADDENDUM: The salient findings were discussed with Dr. Jannifer Franklin on 10/25/16 at 9:02 pm. Electronically Signed   By: Nolon Nations M.D.   On: 10/25/2016 21:03   Result Date: 10/25/2016 CLINICAL DATA:  75 y.o. male brought to the ED due to generalized weakness. Also having vomiting, fell on his floor at home and apparently was unable to care for self in the past 4-5 days. 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 EXAM: CT ABDOMEN AND PELVIS WITHOUT CONTRAST TECHNIQUE: Multidetector CT imaging of the abdomen and pelvis was performed following the standard protocol without IV contrast. COMPARISON:  06/13/2015 FINDINGS: Lower chest: No pulmonary nodules, pleural effusions, or infiltrates. Heart is mildly enlarged. Mitral annulus calcification is present. Hepatobiliary: Gallbladder is distended. Liver is homogeneous and normal in noncontrast appearance. Pancreas: Unremarkable. No pancreatic ductal  dilatation or surrounding inflammatory changes. Spleen: Normal in size without focal abnormality. Adrenals/Urinary Tract: The adrenal glands are normal. The right kidney  is enlarged in there is significant perinephric stranding bilaterally. Dilatation of the collecting system and ureters bilaterally without obvious stone or source of obstruction. There is a small amount of gas within the subcapsular region of the right kidney consistent with emphysematous pyelonephritis. No intrarenal stones or suspicious renal masses are identified. The urinary bladder has a thickened wall. There is significant stranding surrounding the urinary bladder wall. Stomach/Bowel: Stomach and small bowel loops are normal in appearance. The patient has a left mid abdominal colostomy following resection of the rectum. Residual colon appears normal. Vascular/Lymphatic: Significant atherosclerotic calcification of the abdominal aorta. Reproductive: Prostatic calcifications are present. Other: There is significant presacral soft tissue measuring 2.9 x 8.0 cm and suspicious for recurrence in the surgical bed. The ureters and seminal vesicles appear somewhat posteriorly displaced in may be involved by tumor or inflammation. There is a small amount of fluid in the pelvis. Musculoskeletal: Degenerative changes are identified primarily at L4-5. No suspicious lytic or blastic lesions are identified. IMPRESSION: 1. Enlarged kidneys, perinephric stranding, and dilated collecting systems bilaterally. Obstruction may be related to bladder wall thickening, displacement of the ureters, or tumor involvement of the ureter/bladder. No stones are identified as a cause for obstruction. 2. Gas beneath the right renal capsule, suspicious for emphysematous pyelonephritis. 3. Soft tissue density in the presacral region and pelvic wall, suspicious for tumor recurrence in this patient with a history of rectal cancer. 4. There is somewhat posterior displacement of the distal ureters and seminal vesicles as well, raising question of tumor involvement. 5. Bladder wall thickening is marked and may be related to inflammatory/infectious  process or tumor. 6. Distended gallbladder. 7. Left mid abdominal colostomy following AP resection of the rectum. Electronically Signed: By: Nolon Nations M.D. On: 10/25/2016 20:55   Dg Chest Port 1 View  Result Date: 10/25/2016 CLINICAL DATA:  Hyperglycemia. EXAM: PORTABLE CHEST 1 VIEW COMPARISON:  Mar 09, 2015 FINDINGS: The heart size and mediastinal contours are within normal limits. Right central venous line is unchanged. Both lungs are clear. The visualized skeletal structures are unremarkable. IMPRESSION: No active cardiopulmonary disease. Electronically Signed   By: Abelardo Diesel M.D.   On: 10/25/2016 12:47    EKG:   Orders placed or performed during the hospital encounter of 10/25/16  . EKG 12-Lead  . EKG 12-Lead  . EKG 12-Lead  . EKG 12-Lead  . EKG 12-Lead  . EKG 12-Lead    ASSESSMENT AND PLAN:   #1 septic shock /bacteremia; Patient  Blood cltures showed streptococci, strep agalactiea, Enterobacter, Escherichia coli.  Waiting  for full sensitivity data, continue empiric meropenem. ID consult. #2. for septic shock he is on aggressive hydration, keep map around 60. Monitor on telemetry. #3 atrial fibrillation with RVR improved, seen by cardiology, now hypotensive so  On lower  the Cardizem dose and the ordered the holding parameters. Patient EF 55-60%. CHADVASC2 score at least 4; unable to use blood thinners because of malena.  #4 acute renal failure with ATN secondary to sepsis: Patient has bilateral hydronephrosis: Seen by urology, kidney function improving continue Foley catheter to drain, appreciate urology following.  #5 diabetes mellitus type 2: Patient not able to eat eval, continue sliding scale with coverage only.  #6 metabolic encephalopathy secondary to sepsis: Hold sedatives, hold  OxyContin;  #7/ history of rectal cancer status post neoadjuvant chemotherapy, radiation in  April of last year. Patient may have presacral soft tissue mass suspicious for possible  recurrence and surgical bed: GI consult pending. Colostomy bag present in the left upper quadrant,  Filled  with stool   8.melena: Hemoglobin is 9.1. Dropped  from 10.4 likely hemodilution , continue PPIs.  All the records are reviewed and case discussed with Care Management/Social Workerr. Management plans discussed with the patient, family and they are in agreement.  CODE STATUS: DNR  TOTAL TIME TAKING CARE OF THIS PATIENT: 40  minutes.   POSSIBLE D/C IN  5-6 DAYS, DEPENDING ON CLINICAL CONDITION.   Epifanio Lesches M.D on 10/26/2016 at 1:40 PM  Between 7am to 6pm - Pager - [phone]  After 6pm go to [email or web address] - password [email or web address] [facility]  Office  [phone]  CC: Primary care physician; Albina Billet, MD   Note: This dictation was prepared with Dragon dictation along with smaller phrase technology. Any transcriptional errors that result from this process are unintentional.

## 2016-10-27 DIAGNOSIS — D649 Anemia, unspecified: Secondary | ICD-10-CM

## 2016-10-27 LAB — CBC
HCT: 26.7 % — ABNORMAL LOW (ref 40.0–52.0)
Hemoglobin: 9.2 g/dL — ABNORMAL LOW (ref 13.0–18.0)
MCH: 31.5 pg (ref 26.0–34.0)
MCHC: 34.5 g/dL (ref 32.0–36.0)
MCV: 91.1 fL (ref 80.0–100.0)
PLATELETS: 101 10*3/uL — AB (ref 150–440)
RBC: 2.92 MIL/uL — ABNORMAL LOW (ref 4.40–5.90)
RDW: 15.7 % — AB (ref 11.5–14.5)
WBC: 8.3 10*3/uL (ref 3.8–10.6)

## 2016-10-27 LAB — GLUCOSE, CAPILLARY
GLUCOSE-CAPILLARY: 96 mg/dL (ref 65–99)
GLUCOSE-CAPILLARY: 152 mg/dL — AB (ref 65–99)
Glucose-Capillary: 97 mg/dL (ref 65–99)
Glucose-Capillary: 135 mg/dL — ABNORMAL HIGH (ref 65–99)
Glucose-Capillary: 139 mg/dL — ABNORMAL HIGH (ref 65–99)

## 2016-10-27 LAB — BASIC METABOLIC PANEL
Anion gap: 8 (ref 5–15)
BUN: 80 mg/dL — AB (ref 6–20)
CALCIUM: 7 mg/dL — AB (ref 8.9–10.3)
CO2: 20 mmol/L — ABNORMAL LOW (ref 22–32)
CREATININE: 2.45 mg/dL — AB (ref 0.61–1.24)
Chloride: 108 mmol/L (ref 101–111)
GFR calc Af Amer: 28 mL/min — ABNORMAL LOW (ref >60)
GFR, EST NON AFRICAN AMERICAN: 24 mL/min — AB (ref >60)
Glucose, Bld: 95 mg/dL (ref 65–99)
Potassium: 3.5 mmol/L (ref 3.5–5.1)
SODIUM: 136 mmol/L (ref 135–145)

## 2016-10-27 MED ORDER — BOOST / RESOURCE BREEZE PO LIQD
1.0000 | Freq: Three times a day (TID) | ORAL | Status: DC
  Administered 2016-10-27 – 2016-10-29 (×5): 1 via ORAL

## 2016-10-27 NOTE — Progress Notes (Signed)
Gastroenterology Progress Note    Gary Hampton 75 y.o. November 15, 1940   Subjective: A little more responsive this morning. Denies abdominal pain. Small amount of black stool removed from ostomy bag overnight per nursing.  Objective: Vital signs in last 24 hours: Vitals:   10/26/16 1934 10/27/16 0450  BP: (!) 106/41 113/65  Pulse: 82 85  Resp: 18 18  Temp: 98.6 F (37 C) 98.4 F (36.9 C)    Physical Exam: Gen: lethargic, elderly, no acute distress CV: RRR Chest: Coarse breath sounds Abd: right-sided tenderness with guarding, soft, nondistended, +BS, ostomy bag intact with black fluid noted Ext: no edema  Lab Results:  Recent Labs  10/25/16 1914 10/26/16 0310 10/27/16 0447  NA  --  133* 136  K  --  4.1 3.5  CL  --  99* 108  CO2  --  25 20*  GLUCOSE  --  215* 95  BUN  --  86* 80*  CREATININE  --  2.68* 2.45*  CALCIUM  --  7.1* 7.0*  MG 1.4*  --   --     Recent Labs  10/25/16 1215 10/26/16 0310  AST 48* 37  ALT 20 18  ALKPHOS 72 53  BILITOT 1.1 0.6  PROT 6.7 5.0*  ALBUMIN 3.2* 2.3*    Recent Labs  10/25/16 1215  10/26/16 0310 10/27/16 0447  WBC 11.1*  --  12.3* 8.3  NEUTROABS 10.4*  --   --   --   HGB 11.9*  < > 9.1* 9.2*  HCT 34.7*  --  26.3* 26.7*  MCV 92.1  --  91.0 91.1  PLT 160  --  122* 101*  < > = values in this interval not displayed.  Recent Labs  10/25/16 1215  LABPROT 16.0*  INR 1.27      Assessment/Plan: Bacteremia thought to be urologic in origin (per ID and urology) who has been having black fluid in his ostomy bag. Hgb 9.2 (9.1). Consider EGD in the next 2-3 days vs continued supportive care. Continue Protonix IV. Clear liquid diet. See consult note for details.   [place] C. 10/27/2016, 11:22 AM

## 2016-10-27 NOTE — Consult Note (Signed)
[facility] Nurse wound consult note Reason for Consult: traumatic injuries to bilateral feet resulting in left foot with three intact serum-filled blisters and bruised digits (LGT and 2nd and 3rd toes), also blood-filled blister on right foot, posterior aspect of second digit. Wound type:Trauma (Patient cannot recall injury). Pressure Ulcer POA: No Measurement: LGT intact blister (serum-filled) measures 3cm x 2.5cm and encompasses the entire posterior toe pad.  The second and third digit blisters on the left foot are both 1cm x 1cm.  The blood-filled blister on the posterior aspect of the second digit of the right foot measures 1.4cm x 1cm. Wound bed: As described above. Drainage (amount, consistency, odor) None Periwound: ecchymosis noted on left foot anterior digits Dressing procedure/placement/frequency: I will implement a protective care plan for the bilateral toes (and heels) using a twice daily application of a betadine swabstick to dry the blistered areas and prevent infection. Feet will be placed into our pressure redistribution heel boots to prevent further injury to the digits and to float the heels (prevent pressure injuries in this vulnerable location). A prophylactic sacral foam dressing is to be placed to prevent sacral pressure injury. [facility] nursing team will not follow, but will remain available to this patient, the nursing and medical teams.  Please re-consult if needed. Thanks, Maudie Flakes, MSN, RN, [place], Arther Abbott  Pager# [phone]

## 2016-10-27 NOTE — Progress Notes (Signed)
Initial Nutrition Assessment  DOCUMENTATION CODES:   Not applicable  INTERVENTION:  1. Boost Breeze po TID, each supplement provides 250 kcal and 9 grams of protein  NUTRITION DIAGNOSIS:   Increased nutrient needs related to cancer and cancer related treatments as evidenced by estimated needs.  GOAL:   Patient will meet greater than or equal to 90% of their needs  MONITOR:   PO intake, I & O's, Weight trends, Labs, Supplement acceptance  REASON FOR ASSESSMENT:   Consult, Malnutrition Screening Tool Poor PO  ASSESSMENT:   Gary Hampton  is a 75 y.o. 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  Pt sleeping soundly during my visit. Continues poor PO intake - fell and was on the floor for 4 days PTA  ~6 days now with little to no PO intake. Chief complaint is no appetite. Did have some nausea/vomiting upon admission that has since resolved. Weight has fluctuated - got up to 192# in September this year - now down 16#/8.3% over 3 months. Was eating 2 meals per day at home - along with oral nutrition supplements. Nutrition-Focused physical exam completed. Findings are mild-moderate fat depletion, moderate muscle depletion, and no edema.   Labs and medications reviewed: NS @ 152mL/hr  Diet Order:  Diet clear liquid Room service appropriate? Yes; Fluid consistency: Thin  Skin:  Reviewed, no issues  Last BM:  12/30  Height:   Ht Readings from Last 1 Encounters:  10/25/16 6\' 2"  (1.88 m)    Weight:   Wt Readings from Last 1 Encounters:  10/27/16 176 lb (79.8 kg)    Ideal Body Weight:  86.36 kg  BMI:  Body mass index is 22.6 kg/m.  Estimated Nutritional Needs:   Kcal:  2200-2500 calories  Protein:  96-120 gm  Fluid:  >/= 2.2L  EDUCATION NEEDS:   No education needs identified at this time  Satira Anis.  Julya Alioto, MS, RD LDN Inpatient Clinical Dietitian Pager [phone]

## 2016-10-27 NOTE — Progress Notes (Addendum)
[facility] [place] NAME: Gary Hampton    MR#:  [id number]  DATE OF BIRTH:  23-Jun-1941  SUBJECTIVE: Admitted for atrial fibrillation with RVR, acute on chronic renal failure, melena. Patient is very alert awake oriented today. Denies any complaints. Black stool noted in the colostomy bag.   CHIEF COMPLAINT:   Chief Complaint  Patient presents with  . Emesis  . Fall  . Hyperglycemia    REVIEW OF SYSTEMS:   Review of Systems  Constitutional: Positive for malaise/fatigue. Negative for chills and fever.  HENT: Negative for hearing loss.   Eyes: Negative for blurred vision, double vision and photophobia.  Respiratory: Negative for cough, hemoptysis and shortness of breath.   Cardiovascular: Negative for palpitations, orthopnea and leg swelling.  Gastrointestinal: Negative for abdominal pain, diarrhea and vomiting.  Genitourinary: Negative for dysuria and urgency.  Musculoskeletal: Negative for myalgias and neck pain.  Skin: Negative for rash.  Neurological: Positive for weakness. Negative for dizziness, focal weakness, seizures and headaches.  Psychiatric/Behavioral: Negative for memory loss. The patient does not have insomnia.      DRUG ALLERGIES:  No Known Allergies  VITALS:  Blood pressure (!) 99/44, pulse 68, temperature 98.4 F (36.9 C), temperature source Oral, resp. rate 10, height 6\' 2"  (1.88 m), weight 79.8 kg (176 lb), SpO2 98 %.  PHYSICAL EXAMINATION:  GENERAL:  75 y.o.-year-old patient lying in the bed with no acute distress.  EYES: Pupils equal, round, reactive to light and accommodation. No scleral icterus. Extraocular muscles intact.  HEENT: Head atraumatic, normocephalic. Oropharynx and nasopharynx clear.  NECK:  Supple, no jugular venous distention. No thyroid enlargement, no tenderness.  LUNGS: Normal breath sounds bilaterally, no wheezing, rales,rhonchi or crepitation. No use of accessory muscles of  respiration.  CARDIOVASCULAR: S1, S2 normal. No murmurs, rubs, or gallops.  ABDOMEN: Soft, nontender, nondistended. Bowel sounds present. No organomegaly or mass.  EXTREMITIES: No pedal edema, cyanosis, or clubbing.  NEUROLOGIC: Cranial nerves II through XII are intact. Muscle strength 5/5 in all extremities. Sensation intact. Gait not checked.  PSYCHIATRIC: The patient is alert and oriented x 3.  SKIN: No obvious rash, lesion, or ulcer.    LABORATORY PANEL:   CBC  Recent Labs Lab 10/27/16 0447  WBC 8.3  HGB 9.2*  HCT 26.7*  PLT 101*   ------------------------------------------------------------------------------------------------------------------  Chemistries   Recent Labs Lab 10/25/16 1914 10/26/16 0310 10/27/16 0447  NA  --  133* 136  K  --  4.1 3.5  CL  --  99* 108  CO2  --  25 20*  GLUCOSE  --  215* 95  BUN  --  86* 80*  CREATININE  --  2.68* 2.45*  CALCIUM  --  7.1* 7.0*  MG 1.4*  --   --   AST  --  37  --   ALT  --  18  --   ALKPHOS  --  53  --   BILITOT  --  0.6  --    ------------------------------------------------------------------------------------------------------------------  Cardiac Enzymes  Recent Labs Lab 10/25/16 1215  TROPONINI 0.05*   ------------------------------------------------------------------------------------------------------------------  RADIOLOGY:  Ct Abdomen Pelvis Wo Contrast  Addendum Date: 10/25/2016   ADDENDUM REPORT: 10/25/2016 21:03 ADDENDUM: The salient findings were discussed with Dr. Jannifer Franklin on 10/25/16 at 9:02 pm. Electronically Signed   By: Nolon Nations M.D.   On: 10/25/2016 21:03   Result Date: 10/25/2016 CLINICAL DATA:  75 y.o. male brought to the ED due  to generalized weakness. Also having vomiting, fell on his floor at home and apparently was unable to care for self in the past 4-5 days. 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 EXAM: CT ABDOMEN AND PELVIS  WITHOUT CONTRAST TECHNIQUE: Multidetector CT imaging of the abdomen and pelvis was performed following the standard protocol without IV contrast. COMPARISON:  06/13/2015 FINDINGS: Lower chest: No pulmonary nodules, pleural effusions, or infiltrates. Heart is mildly enlarged. Mitral annulus calcification is present. Hepatobiliary: Gallbladder is distended. Liver is homogeneous and normal in noncontrast appearance. Pancreas: Unremarkable. No pancreatic ductal dilatation or surrounding inflammatory changes. Spleen: Normal in size without focal abnormality. Adrenals/Urinary Tract: The adrenal glands are normal. The right kidney is enlarged in there is significant perinephric stranding bilaterally. Dilatation of the collecting system and ureters bilaterally without obvious stone or source of obstruction. There is a small amount of gas within the subcapsular region of the right kidney consistent with emphysematous pyelonephritis. No intrarenal stones or suspicious renal masses are identified. The urinary bladder has a thickened wall. There is significant stranding surrounding the urinary bladder wall. Stomach/Bowel: Stomach and small bowel loops are normal in appearance. The patient has a left mid abdominal colostomy following resection of the rectum. Residual colon appears normal. Vascular/Lymphatic: Significant atherosclerotic calcification of the abdominal aorta. Reproductive: Prostatic calcifications are present. Other: There is significant presacral soft tissue measuring 2.9 x 8.0 cm and suspicious for recurrence in the surgical bed. The ureters and seminal vesicles appear somewhat posteriorly displaced in may be involved by tumor or inflammation. There is a small amount of fluid in the pelvis. Musculoskeletal: Degenerative changes are identified primarily at L4-5. No suspicious lytic or blastic lesions are identified. IMPRESSION: 1. Enlarged kidneys, perinephric stranding, and dilated collecting systems bilaterally.  Obstruction may be related to bladder wall thickening, displacement of the ureters, or tumor involvement of the ureter/bladder. No stones are identified as a cause for obstruction. 2. Gas beneath the right renal capsule, suspicious for emphysematous pyelonephritis. 3. Soft tissue density in the presacral region and pelvic wall, suspicious for tumor recurrence in this patient with a history of rectal cancer. 4. There is somewhat posterior displacement of the distal ureters and seminal vesicles as well, raising question of tumor involvement. 5. Bladder wall thickening is marked and may be related to inflammatory/infectious process or tumor. 6. Distended gallbladder. 7. Left mid abdominal colostomy following AP resection of the rectum. Electronically Signed: By: Nolon Nations M.D. On: 10/25/2016 20:55    EKG:   Orders placed or performed during the hospital encounter of 10/25/16  . EKG 12-Lead  . EKG 12-Lead  . EKG 12-Lead  . EKG 12-Lead  . EKG 12-Lead  . EKG 12-Lead    ASSESSMENT AND PLAN:   #1 septic shock /bacteremia;Likely source  renal abscess versus emphysematous pyelonephritis: Blood cultures positive for strep, Escherichia coli; ID consult appreciated.Sepsis continue aggressive hydration, IV antibiotics.   History of BPH, and UTIs, urinary retention continue indwelling Foley, repeat the CT scan in 48 hours as per urology recommendation.  #2. for septic shock he is on aggressive hydration, keep map around 60. Monitor on telemetry. #3 atrial fibrillation with RVR improved, seen by cardiology, Beta blockers, but there is still low. Secondary to sepsis. Continue aggressive hydration.. Patient EF 55-60%. CHADVASC2 score at least 4; unable to use blood thinners because of malena.  #4 acute renal failure with ATN secondary to sepsis: Patient has bilateral hydronephrosis: Seen by urology, kidney function improving  continue Foley catheter to drain, appreciate urology following.  #5 diabetes  mellitus type 2: Continue SSI with coverage,And a nuclear liquids only today.   #6 metabolic encephalopathy secondary to sepsis: improving,   #7/ history of rectal cancer status post neoadjuvant chemotherapy, radiation in April of last year. Patient may have presacral soft tissue mass suspicious for possible recurrence and surgical bed:  Seen by GI. Colostomy bag present in the left upper quadrant,  Filled  with black stool / hb  currently stable at 9.2.  8.melena: Hemoglobin stable at 9.2, continue PPIs, gastroenterology saw the patient. Possible EGD next 2-3 days. Continue clear liquids only. Continue PPIs.   All the records are reviewed and case discussed with Care Management/Social Workerr. Management plans discussed with the patient, family and they are in agreement.  CODE STATUS: DNR  TOTAL TIME TAKING CARE OF THIS PATIENT: 40  minutes.   POSSIBLE D/C IN  5-6 DAYS, DEPENDING ON CLINICAL CONDITION.   Epifanio Lesches M.D on 10/27/2016 at 1:05 PM  Between 7am to 6pm - Pager - [phone]  After 6pm go to [email or web address] - password [email or web address] [facility]  Office  [phone]  CC: Primary care physician; Albina Billet, MD   Note: This dictation was prepared with Dragon dictation along with smaller phrase technology. Any transcriptional errors that result from this process are unintentional.

## 2016-10-27 NOTE — Progress Notes (Signed)
Physical Therapy Evaluation Patient Details Name: Gary Hampton MRN: [id number] DOB: 1941-10-17 Today's Date: 10/27/2016   History of Present Illness  Patient is a 75 y.o. male admitted on 29 DEC after his neighbors called EMS, reporting patient fell 4 days prior and was beginning to experience vomiting/cramping. Patient found to be hyperglycemic and in ARF. PMH includes atrial fibrillation, gastrointestinal hemorrhage w/melena, and DMII. Patient also has rectal CA and s/p colectomy w/bag in SEP 2016.  Clinical Impression  Patient slightly more alert today, so PT able to complete bed mobility assessment at evaluation. Subjective history obtained from patient who is oriented x1, so it likely has inaccuracies. Patient was a previously independent male who lives alone, admitted for the above reasons. On evaluation, patient demonstrated global weakness in UE/LEs (~3+ to 4-/5) and ability to roll in bed with minimal assistance. When sidelying to sit attempted, patient curled into fetal position while trying to follow cues, demonstrating inability to complete activity. Because patient is not at baseline level of function, it is believed he will continue to benefit from skilled and progressive PT as energy levels improve.    Follow Up Recommendations SNF    Equipment Recommendations       Recommendations for Other Services       Precautions / Restrictions Precautions Precautions: Fall Restrictions Weight Bearing Restrictions: No      Mobility  Bed Mobility Overal bed mobility: Needs Assistance Bed Mobility: Rolling;Sidelying to Sit Rolling: Min assist Sidelying to sit: Max assist (Unable to complete task)       General bed mobility comments: Patient required minimal assistance to roll utilizing bed rails. Required maximal assistance to attempt to move from sidelying to sitting, but he curled into fetal position when trying to follow cues. Unable to complete activity.  Transfers                     Ambulation/Gait                Stairs            Wheelchair Mobility    Modified Rankin (Stroke Patients Only)       Balance                                             Pertinent Vitals/Pain Pain Assessment: No/denies pain    Home Living Family/patient expects to be discharged to:: Private residence Living Arrangements: Alone Available Help at Discharge: Neighbor;Available PRN/intermittently;Other (Comment) (No other known help) Type of Home: House Home Access: Level entry     Home Layout: One level Home Equipment: None Additional Comments: Subjective history obtained from patient. Lives alone in [place].    Prior Function Level of Independence: Independent               Hand Dominance   Dominant Hand: Left    Extremity/Trunk Assessment   Upper Extremity Assessment Upper Extremity Assessment: Generalized weakness    Lower Extremity Assessment Lower Extremity Assessment: Generalized weakness       Communication   Communication: Other (comment) (Patient often asking, "What?")  Cognition Arousal/Alertness: Lethargic Behavior During Therapy: Flat affect Overall Cognitive Status: Difficult to assess                 General Comments: Patient oriented to self.    General Comments  Exercises     Assessment/Plan    PT Assessment Patient needs continued PT services  PT Problem List Decreased strength;Decreased range of motion;Decreased activity tolerance;Decreased balance;Decreased mobility;Decreased cognition;Decreased knowledge of use of DME;Decreased safety awareness          PT Treatment Interventions DME instruction;Gait training;Functional mobility training;Therapeutic activities;Therapeutic exercise;Cognitive remediation;Patient/family education    PT Goals (Current goals can be found in the Care Plan section)  Acute Rehab PT Goals Patient Stated Goal: Unstated PT  Goal Formulation: With patient Time For Goal Achievement: 11/10/16 Potential to Achieve Goals: Poor    Frequency Min 2X/week   Barriers to discharge Inaccessible home environment;Decreased caregiver support      Co-evaluation               End of Session Equipment Utilized During Treatment: Oxygen Activity Tolerance: Patient limited by lethargy Patient left: in bed;with call bell/phone within reach;with bed alarm set           Time: 1151-1205 PT Time Calculation (min) (ACUTE ONLY): 14 min   Charges:   PT Evaluation $PT Eval Low Complexity: 1 Procedure     PT G Codes:        Dorice Lamas, PT, DPT 10/27/2016, 12:17 PM

## 2016-10-27 NOTE — Progress Notes (Signed)
Sacral foam pad applied, as ordered. Patient already had a sacral foam pad on that was only 33 days old and relatively clean. Replaced anyway. Wenda Low [facility]

## 2016-10-27 NOTE — Clinical Social Work Note (Signed)
CSW visited patient to attempt assessment stemming from consult for ALF. Patient was sleeping soundly and would not rouse. CSW will alert primary CSW to need for assessment.   Santiago Bumpers, MSW, LCSW-A [phone]

## 2016-10-27 NOTE — Progress Notes (Signed)
Wound care, including Prevalon boot application, completed at this time, as ordered. Patient tolerated well. Wenda Low [facility]

## 2016-10-27 NOTE — Progress Notes (Signed)
Patient: Gary Hampton / Admit Date: 10/25/2016 / Date of Encounter: 10/27/2016, 12:53 PM   Hospital Problem List     Active Problems:   Acute renal failure ([place])   A-fib ([place])   Gastrointestinal hemorrhage with melena   Vomiting   Fall   Diabetes mellitus ([place])     Subjective   Lethargic this morning, minimally arousable to nurses in the room, physical therapy 110 to work with him. Opened his eyes for one or 2 seconds then went back to sleep Telemetry reviewed showing normal sinus rhythm, no significant arrhythmia Review of vitals shows low blood pressure, heart rate 2 doses of diltiazem 30 mg held yesterday given low blood pressure 1 dose of diltiazem 30 mg given this morning, subsequent blood pressure 99/44   Inpatient Medications    Scheduled Meds: . diltiazem  30 mg Oral Q8H  . insulin aspart  0-15 Units Subcutaneous TID WC  . insulin aspart  0-5 Units Subcutaneous QHS  . loperamide  2 mg Oral BID  . meropenem  1 g Intravenous Q12H  . metoprolol tartrate  50 mg Oral BID  . oxybutynin  10 mg Oral QHS  . oxyCODONE  10 mg Oral Q12H  . pantoprazole (PROTONIX) IV  40 mg Intravenous Q12H  . sodium chloride flush  3 mL Intravenous Q12H   Continuous Infusions: . sodium chloride 100 mL/hr at 10/27/16 0716   PRN Meds: acetaminophen / acetaminophen, albuterol, diphenoxylate-atropine, HYDROcodone-acetaminophen, labetalol, ondansetron / ondansetron (ZOFRAN) IV, polyethylene glycol   Objective: Telemetry:  Physical Exam: Blood pressure (!) 99/44, pulse 68, temperature 98.4 F (36.9 C), temperature source Oral, resp. rate 10, height 6\' 2"  (1.88 m), weight 176 lb (79.8 kg), SpO2 98 %. Body mass index is 22.6 kg/m. GEN: Well nourished, well developed, in no acute distress, lethargic HEENT: Grossly normal.  Neck: Supple, no JVD, carotid bruits, or masses. Cardiac: RRR, no murmurs, rubs, or gallops. No clubbing, cyanosis, edema.  Radials/DP/PT 2+ and equal bilaterally.    Respiratory:  Respirations regular and unlabored, clear to auscultation bilaterally. GI: Soft, nontender, nondistended, BS + x 4. MS: no deformity or atrophy. Skin: warm and dry, no rash. Neuro:  Unable to test as patient is very lethargic  Psych: lethargic, minimally conversant    Intake/Output Summary (Last 24 hours) at 10/27/16 1253 Last data filed at 10/27/16 1014  Gross per 24 hour  Intake             1395 ml  Output             1004 ml  Net              391 ml     Labs: CBC  Recent Labs  10/25/16 1215  10/26/16 0310 10/27/16 0447  WBC 11.1*  --  12.3* 8.3  NEUTROABS 10.4*  --   --   --   HGB 11.9*  < > 9.1* 9.2*  HCT 34.7*  --  26.3* 26.7*  MCV 92.1  --  91.0 91.1  PLT 160  --  122* 101*  < > = values in this interval not displayed. Basic Metabolic Panel  Recent Labs  10/25/16 1914 10/26/16 0310 10/27/16 0447  NA  --  133* 136  K  --  4.1 3.5  CL  --  99* 108  CO2  --  25 20*  GLUCOSE  --  215* 95  BUN  --  86* 80*  CREATININE  --  2.68*  2.45*  CALCIUM  --  7.1* 7.0*  MG 1.4*  --   --    Liver Function Tests  Recent Labs  10/25/16 1215 10/26/16 0310  AST 48* 37  ALT 20 18  ALKPHOS 72 53  BILITOT 1.1 0.6  PROT 6.7 5.0*  ALBUMIN 3.2* 2.3*    Recent Labs  10/25/16 1215  LIPASE <10*   Cardiac Enzymes  Recent Labs  10/25/16 1215  CKTOTAL 654*  TROPONINI 0.05*   BNP Invalid input(s): POCBNP D-Dimer No results for input(s): DDIMER in the last 72 hours. Hemoglobin A1C  Recent Labs  10/25/16 1215  HGBA1C 7.5*   Fasting Lipid Panel No results for input(s): CHOL, HDL, LDLCALC, TRIG, CHOLHDL, LDLDIRECT in the last 72 hours. Thyroid Function Tests  Recent Labs  10/25/16 1215  TSH 4.798*    Weights: Filed Weights   10/25/16 2115 10/26/16 0331 10/27/16 0450  Weight: 168 lb 14.4 oz (76.6 kg) 169 lb 4.8 oz (76.8 kg) 176 lb (79.8 kg)     Radiology/Studies:  Ct Abdomen Pelvis Wo Contrast Result Date: 10/25/2016 CLINICAL  DATA:  75 y.o. male brought to the ED due to generalized weakness. Also having vomiting, fell on his floor at home and apparently was unable to care for self in the past 4-5 days. 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 . IMPRESSION: 1. Enlarged kidneys, perinephric stranding, and dilated collecting systems bilaterally. Obstruction may be related to bladder wall thickening, displacement of the ureters, or tumor involvement of the ureter/bladder. No stones are identified as a cause for obstruction. 2. Gas beneath the right renal capsule, suspicious for emphysematous pyelonephritis. 3. Soft tissue density in the presacral region and pelvic wall, suspicious for tumor recurrence in this patient with a history of rectal cancer. 4. There is somewhat posterior displacement of the distal ureters and seminal vesicles as well, raising question of tumor involvement. 5. Bladder wall thickening is marked and may be related to inflammatory/infectious process or tumor. 6. Distended gallbladder. 7. Left mid abdominal colostomy following AP resection of the rectum. Electronically Signed: By: Nolon Nations M.D. On: 10/25/2016 20:55   Dg Chest Port 1 View  Result Date: 10/25/2016 CLINICAL DATA:  Hyperglycemia. EXAM: PORTABLE CHEST 1 VIEW COMPARISON:  Mar 09, 2015 FINDINGS: The heart size and mediastinal contours are within normal limits. Right central venous line is unchanged. Both lungs are clear. The visualized skeletal structures are unremarkable. IMPRESSION: No active cardiopulmonary disease. Electronically Signed   By: Abelardo Diesel M.D.   On: 10/25/2016 12:47     Assessment and Plan  75 y.o. male   1). Paroxysmal atrial fibrillation No further atrial fibrillation noted in past 24 hours Suspect arrhythmia brought on by bilateral hydronephrosis , improved with placement of Foley catheter  resolved with Cardizem IV push, Not on anticoagulation given drop in  hematocrit, melena Echocardiogram with No structural issues predisposing him to atrial fibrillation, normal left atrial size, normal LV function Would not start anticoagulation at this time given underlying anemia despite stable hematocrit 1 day Would reevaluate once hemoglobin greater than 10 CHADS VASC at least 4 (age, DM, vascular  Disease seen on CT scan) --Recommend we hold diltiazem given hypotension  2) acute renal failure, ATN Suspect secondary to bilateral hydronephrosis Slow improvement over the past 48 hours  3) diabetes type 2, insulin-dependent Unable to care for himself at home  Glucose levels 350 on arrival Hemoglobin A1c 7.5  4) melena Drop in blood  count,  Would not start anticoagulation at this time given underlying anemia despite stable hematocrit Would reevaluate once hemoglobin greater than 10  5) encephalopathy Urinary tract infection, off his insulin Elevated lactic acid currently on antibiotics Baseline unclear, still encephalopathic today    Total encounter time more than 25 minutes  Greater than 50% was spent in counseling and coordination of care with the patient   Signed, Esmond Plants, MD, Ph.D. [facility] 10/27/2016, 12:53 PM

## 2016-10-27 NOTE — Progress Notes (Signed)
Spoke to Dr. Vianne Bulls regarding patient's continuous NS infusion, and the possibility of extending the order end time considering patient's current condition. See modified order regarding a 5 day extension of current order and rate. Wenda Low [facility]

## 2016-10-28 ENCOUNTER — Inpatient Hospital Stay: Payer: Medicare Other

## 2016-10-28 DIAGNOSIS — N401 Enlarged prostate with lower urinary tract symptoms: Secondary | ICD-10-CM

## 2016-10-28 DIAGNOSIS — R110 Nausea: Secondary | ICD-10-CM

## 2016-10-28 DIAGNOSIS — N1330 Unspecified hydronephrosis: Secondary | ICD-10-CM

## 2016-10-28 LAB — BASIC METABOLIC PANEL
ANION GAP: 7 (ref 5–15)
BUN: 67 mg/dL — ABNORMAL HIGH (ref 6–20)
CHLORIDE: 107 mmol/L (ref 101–111)
CO2: 23 mmol/L (ref 22–32)
Calcium: 7.3 mg/dL — ABNORMAL LOW (ref 8.9–10.3)
Creatinine, Ser: 2 mg/dL — ABNORMAL HIGH (ref 0.61–1.24)
GFR calc Af Amer: 36 mL/min — ABNORMAL LOW (ref >60)
GFR calc non Af Amer: 31 mL/min — ABNORMAL LOW (ref >60)
GLUCOSE: 114 mg/dL — AB (ref 65–99)
POTASSIUM: 3.6 mmol/L (ref 3.5–5.1)
Sodium: 137 mmol/L (ref 135–145)

## 2016-10-28 LAB — CBC
HEMATOCRIT: 26.4 % — AB (ref 40.0–52.0)
HEMOGLOBIN: 9.1 g/dL — AB (ref 13.0–18.0)
MCH: 31.5 pg (ref 26.0–34.0)
MCHC: 34.5 g/dL (ref 32.0–36.0)
MCV: 91.3 fL (ref 80.0–100.0)
Platelets: 100 10*3/uL — ABNORMAL LOW (ref 150–440)
RBC: 2.89 MIL/uL — ABNORMAL LOW (ref 4.40–5.90)
RDW: 16 % — AB (ref 11.5–14.5)
WBC: 5.9 10*3/uL (ref 3.8–10.6)

## 2016-10-28 LAB — GLUCOSE, CAPILLARY
GLUCOSE-CAPILLARY: 144 mg/dL — AB (ref 65–99)
GLUCOSE-CAPILLARY: 108 mg/dL — AB (ref 65–99)
Glucose-Capillary: 151 mg/dL — ABNORMAL HIGH (ref 65–99)
Glucose-Capillary: 195 mg/dL — ABNORMAL HIGH (ref 65–99)

## 2016-10-28 MED ORDER — VANCOMYCIN HCL IN DEXTROSE 1-5 GM/200ML-% IV SOLN
1000.0000 mg | INTRAVENOUS | Status: DC
  Administered 2016-10-28: 1000 mg via INTRAVENOUS
  Filled 2016-10-28 (×2): qty 200

## 2016-10-28 MED ORDER — VANCOMYCIN HCL IN DEXTROSE 1-5 GM/200ML-% IV SOLN
1000.0000 mg | INTRAVENOUS | Status: AC
  Administered 2016-10-28: 1000 mg via INTRAVENOUS
  Filled 2016-10-28: qty 200

## 2016-10-28 NOTE — Progress Notes (Signed)
Pharmacy Antibiotic Note  Gary Hampton is a 76 y.o. male admitted on 10/25/2016 with sepsis.  Pharmacy has been consulted for vancomycin dosing.  Plan: Will initiated Vancomycin 1000mg   IV x 1 then Vancomycin 1000mg  IV Q24h after 1st dose per stacked dose protocol. Will order Vancomycin trough level prior to the fifth dose.  Ke=0.035 T1/2=19.8 VD=57.5 Stacked dose=6hrs  Height: 6\' 2"  (188 cm) Weight: 181 lb 12.8 oz (82.5 kg) IBW/kg (Calculated) : 82.2  Temp (24hrs), Avg:98.1 F (36.7 C), Min:97.6 F (36.4 C), Max:98.6 F (37 C)   Recent Labs Lab 10/25/16 1215 10/25/16 1236 10/25/16 1512 10/26/16 0310 10/27/16 0447 10/28/16 0421  WBC 11.1*  --   --  12.3* 8.3 5.9  CREATININE 3.46*  --   --  2.68* 2.45* 2.00*  LATICACIDVEN  --  2.8* 1.9  --   --   --     Estimated Creatinine Clearance: 36.5 mL/min (by C-G formula based on SCr of 2 mg/dL (H)).    No Known Allergies  Antimicrobials this admission: CTX 12/29 >>12/29 Meropenem 12/30 >>  Vancomycin 1/1 >>  Microbiology results: 12/29 BCx: streptococcus species- streptococcus agalctiae, Enterobacteriaceae species -E.coli 12/29 UCx: >100,000 colonies S. agalactiae  Thank you for allowing pharmacy to be a part of this patient's care.  Loree Fee, PharmD 10/28/2016 3:19 PM

## 2016-10-28 NOTE — Progress Notes (Addendum)
Gastroenterology Progress Note    Blair Brull 76 y.o. June 07, 1941   Subjective: Awake and more alert today. Denies abdominal pain, nausea, or vomiting.  Objective: Vital signs in last 24 hours: Vitals:   10/28/16 0435 10/28/16 0806  BP: (!) 126/47 (!) 134/51  Pulse: 92 87  Resp: 18 20  Temp: 98.2 F (36.8 C) 98.6 F (37 C)    Physical Exam: Gen: lethargic, no acute distress, elderly HEENT: anicteric sclera CV: RRR Chest: CTA B Abd: minimal right-sided tenderness with guarding, soft, nondistended, +BS, small amount of black stool in ostomy bag  Lab Results:  Recent Labs  10/25/16 1914  10/27/16 0447 10/28/16 0421  NA  --   < > 136 137  K  --   < > 3.5 3.6  CL  --   < > 108 107  CO2  --   < > 20* 23  GLUCOSE  --   < > 95 114*  BUN  --   < > 80* 67*  CREATININE  --   < > 2.45* 2.00*  CALCIUM  --   < > 7.0* 7.3*  MG 1.4*  --   --   --   < > = values in this interval not displayed.  Recent Labs  10/25/16 1215 10/26/16 0310  AST 48* 37  ALT 20 18  ALKPHOS 72 53  BILITOT 1.1 0.6  PROT 6.7 5.0*  ALBUMIN 3.2* 2.3*    Recent Labs  10/25/16 1215  10/27/16 0447 10/28/16 0421  WBC 11.1*  < > 8.3 5.9  NEUTROABS 10.4*  --   --   --   HGB 11.9*  < > 9.2* 9.1*  HCT 34.7*  < > 26.7* 26.4*  MCV 92.1  < > 91.1 91.3  PLT 160  < > 101* 100*  < > = values in this interval not displayed.  Recent Labs  10/25/16 1215  LABPROT 16.0*  INR 1.27      Assessment/Plan: Melena - appears to be resolving. Only 10cc removed from ostomy yesterday. Hard to know if black stool in bag is old or new and will recommend a new bag be placed because if still having black stool then will need an EGD before discharge. Hgb stable so no acute need for EGD right now. Will change to full liquid diet. Dr. Riki Rusk to f/u again this week. Bacteremia - on Meropenem; thought to be urologic source.   [place] C. 10/28/2016, 9:48 AM

## 2016-10-28 NOTE — Progress Notes (Signed)
[place] at [place] NAME: Gary Hampton    MR#:  [id number]  DATE OF BIRTH:  02-08-1941  SUBJECTIVE: Admitted for atrial fibrillation with RVR, sepsis due to UTI, renal failure. Patient had abdominal CAT scan today which showed worsening of pyelonephritis, emphysematous changes in the kidney. Slightly lethargic.   CHIEF COMPLAINT:   Chief Complaint  Patient presents with  . Emesis  . Fall  . Hyperglycemia    REVIEW OF SYSTEMS:   Review of Systems  Constitutional: Positive for malaise/fatigue. Negative for chills and fever.  HENT: Negative for hearing loss.   Eyes: Negative for blurred vision, double vision and photophobia.  Respiratory: Negative for cough, hemoptysis and shortness of breath.   Cardiovascular: Negative for palpitations, orthopnea and leg swelling.  Gastrointestinal: Negative for abdominal pain, diarrhea and vomiting.  Genitourinary: Negative for dysuria and urgency.  Musculoskeletal: Negative for myalgias and neck pain.  Skin: Negative for rash.  Neurological: Positive for weakness. Negative for dizziness, focal weakness, seizures and headaches.  Psychiatric/Behavioral: Negative for memory loss. The patient does not have insomnia.      DRUG ALLERGIES:  No Known Allergies  VITALS:  Blood pressure (!) 118/46, pulse 78, temperature 97.6 F (36.4 C), temperature source Oral, resp. rate 18, height 6\' 2"  (1.88 m), weight 82.5 kg (181 lb 12.8 oz), SpO2 100 %.  PHYSICAL EXAMINATION:  GENERAL:  76 y.o.-year-old patient lying in the bed with no acute distress.  EYES: Pupils equal, round, reactive to light and accommodation. No scleral icterus. Extraocular muscles intact.  HEENT: Head atraumatic, normocephalic. Oropharynx and nasopharynx clear.  NECK:  Supple, no jugular venous distention. No thyroid enlargement, no tenderness.  LUNGS: Normal breath sounds bilaterally, no wheezing, rales,rhonchi or crepitation. No  use of accessory muscles of respiration.  CARDIOVASCULAR: S1, S2 normal. No murmurs, rubs, or gallops.  ABDOMEN: Soft, nontender, nondistended. Bowel sounds present. No organomegaly or mass.  EXTREMITIES: No pedal edema, cyanosis, or clubbing.  NEUROLOGIC: Cranial nerves II through XII are intact. Muscle strength 5/5 in all extremities. Sensation intact. Gait not checked.  PSYCHIATRIC: The patient is alert and oriented x 3.  SKIN: No obvious rash, lesion, or ulcer.    LABORATORY PANEL:   CBC  Recent Labs Lab 10/28/16 0421  WBC 5.9  HGB 9.1*  HCT 26.4*  PLT 100*   ------------------------------------------------------------------------------------------------------------------  Chemistries   Recent Labs Lab 10/25/16 1914 10/26/16 0310  10/28/16 0421  NA  --  133*  < > 137  K  --  4.1  < > 3.6  CL  --  99*  < > 107  CO2  --  25  < > 23  GLUCOSE  --  215*  < > 114*  BUN  --  86*  < > 67*  CREATININE  --  2.68*  < > 2.00*  CALCIUM  --  7.1*  < > 7.3*  MG 1.4*  --   --   --   AST  --  37  --   --   ALT  --  18  --   --   ALKPHOS  --  53  --   --   BILITOT  --  0.6  --   --   < > = values in this interval not displayed. ------------------------------------------------------------------------------------------------------------------  Cardiac Enzymes  Recent Labs Lab 10/25/16 1215  TROPONINI 0.05*   ------------------------------------------------------------------------------------------------------------------  RADIOLOGY:  Ct Renal Stone Study  Result Date: 10/28/2016  CLINICAL DATA:  Emphysematous pyelonephritis. History of rectal cancer. EXAM: CT ABDOMEN AND PELVIS WITHOUT CONTRAST TECHNIQUE: Multidetector CT imaging of the abdomen and pelvis was performed following the standard protocol without IV contrast. COMPARISON:  CT scan dated 10/25/2016 FINDINGS: Lower chest: The patient has new bibasilar atelectasis, right greater than left, with small bilateral pleural  effusions. Hepatobiliary: There is either sludge or stones in the gallbladder. Liver parenchyma is normal. Pancreas: Unremarkable. No pancreatic ductal dilatation or surrounding inflammatory changes. Spleen: Normal. Adrenals/Urinary Tract: There is increased right perinephric soft tissue stranding as well as increased gas in the perinephric space extending into the right renal parenchyma. There is increased ascites in the right pericolic gutter extending around the inferior tip of the liver in the gallbladder. Soft tissue stranding extends along the right ureter into the pelvis. Increased ascites in the pelvis. Foley catheter in the bladder. There is increased soft tissue stranding around the left kidney with new small amount of fluid in the left pericolic gutter. 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 Vascular/Lymphatic: Extensive aortic atherosclerosis. No adenopathy. Reproductive: Prostate is unremarkable. Musculoskeletal: No acute or significant osseous findings. IMPRESSION: 1. Progression of right emphysematous pyelonephritis with increased ascites and increased perinephric soft tissue stranding bilaterally. 2. Aortic atherosclerosis. 3. Sludge or stones in the gallbladder. 4. New bibasilar atelectasis and small bilateral pleural effusions, right greater than left. Electronically Signed   By: Lorriane Shire M.D.   On: 10/28/2016 11:55    EKG:   Orders placed or performed during the hospital encounter of 10/25/16  . EKG 12-Lead  . EKG 12-Lead  . EKG 12-Lead  . EKG 12-Lead  . EKG 12-Lead  . EKG 12-Lead    ASSESSMENT AND PLAN:   #1 septic shock /bacteremia;Likely source  renal abscess versus emphysematous pyelonephritis: Blood cultures positive for strep, Escherichia coli; ID consult appreciated.Sepsis continue aggressive hydration, IV antibiotics. CT study today showed worsening of right-sided emphysematous pyelonephritis with increased perinephric stranding bilaterally. Appreciate  renal following this    History of BPH, and UTIs, urinary retention continue indwelling Foley, .  #2. Shock improving blood pressure is improving, no fevers. Continue IV hydration today.   #3 atrial fibrillation with RVR improved, seen by cardiology, . Secondary to sepsis. Continue aggressive hydration.. Patient EF 55-60%. CHADVASC2 score at least 4; unable to use blood thinners because of malena.  #4 acute renal failure with ATN secondary to sepsis: Patient has bilateral hydronephrosis: Seen by urology, kidney function improving continue Foley catheter to drain, appreciate urology following.  renal Function improving. #5 diabetes mellitus type 2: Continue SSI with coverage, continue liquids only.  #6 metabolic encephalopathy secondary to sepsis: improving,   #7/ history of rectal cancer status post neoadjuvant chemotherapy, radiation in April of last year. Patient may have presacral soft tissue mass suspicious for possible recurrence and surgical bed:  Seen by GI. Colostomy bag present in the left upper quadrant,  Filled  with black stool / hb    8.melena: Hemoglobin stable at 9.2, continue PPIs, gastroenterology saw the patient.  Continue full liquids . Continue PPIs.  pt needs EGD before discharge.   All the records are reviewed and case discussed with Care Management/Social Workerr. Management plans discussed with the patient, family and they are in agreement.  CODE STATUS: DNR  TOTAL TIME TAKING CARE OF THIS PATIENT: 40  minutes.   POSSIBLE D/C IN  5-6 DAYS, DEPENDING ON CLINICAL CONDITION.   Epifanio Lesches M.D on 10/28/2016 at 1:17 PM  Between 7am  to 6pm - Pager - [phone]  After 6pm go to [email or web address] - password [email or web address] [facility]  Office  [phone]  CC: Primary care physician; Albina Billet, MD   Note: This dictation was prepared with Dragon dictation along with smaller phrase technology. Any transcriptional errors that result from  this process are unintentional.

## 2016-10-28 NOTE — Progress Notes (Signed)
Subjective: Patient reports no change in abdominal discomfort. CT scan showed worsening emphesematous changes in the right kidney and worsenign stranding of his left kidney. WBC count is 5.9. Pt is afebrile..  Objective: Vital signs in last 24 hours: Temp:  [97.6 F (36.4 C)-98.6 F (37 C)] 98 F (36.7 C) (01/01 1926) Pulse Rate:  [78-92] 84 (01/01 1926) Resp:  [15-20] 15 (01/01 1926) BP: (110-134)/(46-59) 110/59 (01/01 1926) SpO2:  [93 %-100 %] 100 % (01/01 1926) Weight:  [82.5 kg (181 lb 12.8 oz)] 82.5 kg (181 lb 12.8 oz) (01/01 0435)  Intake/Output from previous day: 12/31 0701 - 01/01 0700 In: [id number] [P.O.:342; I.V.:1225; IV Piggyback:50] Out: 1310 [Urine:1300; Stool:10] Intake/Output this shift: Total I/O In: 10 [I.V.:10] Out: -   Physical Exam:  General:alert, appears older than stated age and slowed mentation GI: soft, non tender, normal bowel sounds, no palpable masses, no organomegaly, no inguinal hernia Male genitalia: not done Extremities: extremities normal, atraumatic, no cyanosis or edema  Lab Results:  Recent Labs  10/26/16 0310 10/27/16 0447 10/28/16 0421  HGB 9.1* 9.2* 9.1*  HCT 26.3* 26.7* 26.4*   BMET  Recent Labs  10/27/16 0447 10/28/16 0421  NA 136 137  K 3.5 3.6  CL 108 107  CO2 20* 23  GLUCOSE 95 114*  BUN 80* 67*  CREATININE 2.45* 2.00*  CALCIUM 7.0* 7.3*   No results for input(s): LABPT, INR in the last 72 hours. No results for input(s): LABURIN in the last 72 hours. Results for orders placed or performed during the hospital encounter of 10/25/16  Urine culture     Status: Abnormal   Collection Time: 10/25/16 12:36 PM  Result Value Ref Range Status   Specimen Description URINE, RANDOM  Final   Special Requests NONE  Final   Culture (A)  Final    >=100,000 COLONIES/mL GROUP B STREP(S.AGALACTIAE)ISOLATED TESTING AGAINST S. AGALACTIAE NOT ROUTINELY PERFORMED DUE TO PREDICTABILITY OF AMP/PEN/VAN SUSCEPTIBILITY. Performed at [facility]    Report Status 10/26/2016 FINAL  Final  Blood Culture (routine x 2)     Status: Abnormal (Preliminary result)   Collection Time: 10/25/16 12:54 PM  Result Value Ref Range Status   Specimen Description BLOOD RIGHT ARM  Final   Special Requests   Final    BOTTLES DRAWN AEROBIC AND ANAEROBIC ANA14ML AER13ML   Culture  Setup Time   Final    GRAM POSITIVE COCCI GRAM NEGATIVE RODS IN BOTH AEROBIC AND ANAEROBIC BOTTLES CONFIRMED BY RWW CRITICAL RESULT CALLED TO, READ BACK BY AND VERIFIED WITH: MATT Lyman AT [place] ON 10/26/16 RWW    Culture (A)  Final    ESCHERICHIA COLI GROUP B STREP(S.AGALACTIAE)ISOLATED SUSCEPTIBILITIES TO FOLLOW    Report Status PENDING  Incomplete   Organism ID, Bacteria ESCHERICHIA COLI  Final      Susceptibility   Escherichia coli - MIC*    AMPICILLIN <=2 SENSITIVE Sensitive     CEFAZOLIN <=4 SENSITIVE Sensitive     CEFEPIME <=1 SENSITIVE Sensitive     CEFTAZIDIME <=1 SENSITIVE Sensitive     CEFTRIAXONE <=1 SENSITIVE Sensitive     CIPROFLOXACIN <=0.25 SENSITIVE Sensitive     GENTAMICIN <=1 SENSITIVE Sensitive     IMIPENEM <=0.25 SENSITIVE Sensitive     TRIMETH/SULFA <=20 SENSITIVE Sensitive     AMPICILLIN/SULBACTAM <=2 SENSITIVE Sensitive     PIP/TAZO <=4 SENSITIVE Sensitive     Extended ESBL Value in next row Sensitive      NEGATIVEPerformed at [facility]    * ESCHERICHIA COLI  Blood Culture (routine x 2)     Status: Abnormal (Preliminary result)   Collection Time: 10/25/16 12:54 PM  Result Value Ref Range Status   Specimen Description BLOOD LEFT ARM  Final   Special Requests BOTTLES DRAWN AEROBIC AND ANAEROBIC AER7ML ANA8ML  Final   Culture  Setup Time   Final    GRAM NEGATIVE RODS GRAM POSITIVE COCCI IN BOTH AEROBIC AND ANAEROBIC BOTTLES CRITICAL VALUE NOTED.  VALUE IS CONSISTENT WITH PREVIOUSLY REPORTED AND CALLED VALUE. CONFIRMED BY [facility]    Culture (A)  Final    ESCHERICHIA COLI GROUP B  STREP(S.AGALACTIAE)ISOLATED SUSCEPTIBILITIES PERFORMED ON PREVIOUS CULTURE WITHIN THE LAST 5 DAYS. Performed at [facility]    Report Status PENDING  Incomplete  Blood Culture ID Panel (Reflexed)     Status: Abnormal   Collection Time: 10/25/16 12:54 PM  Result Value Ref Range Status   Enterococcus species NOT DETECTED NOT DETECTED Final   Listeria monocytogenes NOT DETECTED NOT DETECTED Final   Staphylococcus species NOT DETECTED NOT DETECTED Final   Staphylococcus aureus NOT DETECTED NOT DETECTED Final   Streptococcus species DETECTED (A) NOT DETECTED Final    Comment: CRITICAL RESULT CALLED TO, READ BACK BY AND VERIFIED WITH: MATT MCBANE AT 0159 ON 10/26/16 RWW    Streptococcus agalactiae DETECTED (A) NOT DETECTED Final    Comment: CRITICAL RESULT CALLED TO, READ BACK BY AND VERIFIED WITH: MATT MCBANE AT 0159 ON 10/26/16 RWW    Streptococcus pneumoniae NOT DETECTED NOT DETECTED Final   Streptococcus pyogenes NOT DETECTED NOT DETECTED Final   Acinetobacter baumannii NOT DETECTED NOT DETECTED Final   Enterobacteriaceae species DETECTED (A) NOT DETECTED Final    Comment: CRITICAL RESULT CALLED TO, READ BACK BY AND VERIFIED WITH: MATT MCBANE AT 0159 ON 10/26/16 RWW    Enterobacter cloacae complex NOT DETECTED NOT DETECTED Final   Escherichia coli DETECTED (A) NOT DETECTED Final    Comment: CRITICAL RESULT CALLED TO, READ BACK BY AND VERIFIED WITH: MATT MCBANE AT 0159 ON 10/26/16 RWW    Klebsiella oxytoca NOT DETECTED NOT DETECTED Final   Klebsiella pneumoniae NOT DETECTED NOT DETECTED Final   Proteus species NOT DETECTED NOT DETECTED Final   Serratia marcescens NOT DETECTED NOT DETECTED Final   Carbapenem resistance NOT DETECTED NOT DETECTED Final   Haemophilus influenzae NOT DETECTED NOT DETECTED Final   Neisseria meningitidis NOT DETECTED NOT DETECTED Final   Pseudomonas aeruginosa NOT DETECTED NOT DETECTED Final   Candida albicans NOT DETECTED NOT DETECTED Final    Candida glabrata NOT DETECTED NOT DETECTED Final   Candida krusei NOT DETECTED NOT DETECTED Final   Candida parapsilosis NOT DETECTED NOT DETECTED Final   Candida tropicalis NOT DETECTED NOT DETECTED Final    Studies/Results: Ct Renal Stone Study  Result Date: 10/28/2016 CLINICAL DATA:  Emphysematous pyelonephritis. History of rectal cancer. EXAM: CT ABDOMEN AND PELVIS WITHOUT CONTRAST TECHNIQUE: Multidetector CT imaging of the abdomen and pelvis was performed following the standard protocol without IV contrast. COMPARISON:  CT scan dated 10/25/2016 FINDINGS: Lower chest: The patient has new bibasilar atelectasis, right greater than left, with small bilateral pleural effusions. Hepatobiliary: There is either sludge or stones in the gallbladder. Liver parenchyma is normal. Pancreas: Unremarkable. No pancreatic ductal dilatation or surrounding inflammatory changes. Spleen: Normal. Adrenals/Urinary Tract: There is increased right perinephric soft tissue stranding as well as increased gas in the perinephric space extending into the right renal parenchyma. There is increased ascites  in the right pericolic gutter extending around the inferior tip of the liver in the gallbladder. Soft tissue stranding extends along the right ureter into the pelvis. Increased ascites in the pelvis. Foley catheter in the bladder. There is increased soft tissue stranding around the left kidney with new small amount of fluid in the left pericolic gutter. 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 Vascular/Lymphatic: Extensive aortic atherosclerosis. No adenopathy. Reproductive: Prostate is unremarkable. Musculoskeletal: No acute or significant osseous findings. IMPRESSION: 1. Progression of right emphysematous pyelonephritis with increased ascites and increased perinephric soft tissue stranding bilaterally. 2. Aortic atherosclerosis. 3. Sludge or stones in the gallbladder. 4. New bibasilar atelectasis and small bilateral pleural  effusions, right greater than left. Electronically Signed   By: Lorriane Shire M.D.   On: 10/28/2016 11:55    Assessment/Plan: 75yo with bacteremia, emphesematous pyelonephritis, bladder outlet obstruction.  1: emphesematous pyelonephritis with bacteremia: Continue broad spectrum antibiotics. I discussed the management of emphesematous pyelonephritis with the patient which involves nephrectomy. Currently the CT appearance raises the concern for a bilateral process which would render the patient anephric. He does not wish to pursue surgery at this time and wishes to continue medical management. Urology to continue to follow.   LOS: 3 days   Nicolette Bang 10/28/2016, 10:15 PM

## 2016-10-28 NOTE — Progress Notes (Signed)
Colostomy two piece changed per md request. holister 43mm 2 1/4 inc used. Stoma cleansed, pink in color

## 2016-10-29 DIAGNOSIS — R112 Nausea with vomiting, unspecified: Secondary | ICD-10-CM

## 2016-10-29 LAB — CULTURE, BLOOD (ROUTINE X 2)

## 2016-10-29 LAB — CREATININE, SERUM
Creatinine, Ser: 1.82 mg/dL — ABNORMAL HIGH (ref 0.61–1.24)
GFR calc Af Amer: 40 mL/min — ABNORMAL LOW (ref >60)
GFR calc non Af Amer: 34 mL/min — ABNORMAL LOW (ref >60)

## 2016-10-29 LAB — GLUCOSE, CAPILLARY
GLUCOSE-CAPILLARY: 136 mg/dL — AB (ref 65–99)
GLUCOSE-CAPILLARY: 99 mg/dL (ref 65–99)
GLUCOSE-CAPILLARY: 173 mg/dL — AB (ref 65–99)
Glucose-Capillary: 266 mg/dL — ABNORMAL HIGH (ref 65–99)

## 2016-10-29 MED ORDER — ENSURE ENLIVE PO LIQD
237.0000 mL | Freq: Two times a day (BID) | ORAL | Status: DC
  Administered 2016-10-31 – 2016-11-01 (×3): 237 mL via ORAL

## 2016-10-29 MED ORDER — SODIUM CHLORIDE 0.9 % IV SOLN
3.0000 g | Freq: Four times a day (QID) | INTRAVENOUS | Status: DC
  Administered 2016-10-29 – 2016-10-31 (×7): 3 g via INTRAVENOUS
  Filled 2016-10-29 (×10): qty 3

## 2016-10-29 NOTE — Progress Notes (Signed)
Pharmacy Antibiotic Note  Gary Hampton is a 76 y.o. male admitted on 10/25/2016 with sepsis.  Pharmacy has been consulted for vancomycin dosing.  Plan: Based on BCx sensitivities, will transition patient from meropenem 1 g q12h and vancomycin 1g q24h to ampicillin/sulbactam 3 g IV q6h. Per ID, want to continue to cover for anaerobes and will transition patient to Augmentin at discharge for at least 21 days of total therapy.   Height: 6\' 2"  (188 cm) Weight: 180 lb 1.9 oz (81.7 kg) IBW/kg (Calculated) : 82.2  Temp (24hrs), Avg:97.9 F (36.6 C), Min:97.8 F (36.6 C), Max:98 F (36.7 C)   Recent Labs Lab 10/25/16 1215 10/25/16 1236 10/25/16 1512 10/26/16 0310 10/27/16 0447 10/28/16 0421 10/29/16 0551  WBC 11.1*  --   --  12.3* 8.3 5.9  --   CREATININE 3.46*  --   --  2.68* 2.45* 2.00* 1.82*  LATICACIDVEN  --  2.8* 1.9  --   --   --   --     Estimated Creatinine Clearance: 39.9 mL/min (by C-G formula based on SCr of 1.82 mg/dL (H)).    No Known Allergies  Antimicrobials this admission: CTX 12/29 >>12/29 Meropenem 12/30 >> 1/2 Vancomycin 1/1 >> 1/2 Amp/sulbactam 1/2 >>  Microbiology results: 12/29 BCx: E.coli and GBS pansensitive 12/29 UCx: GBS  Thank you for allowing pharmacy to be a part of this patient's care.  Darrow Bussing, PharmD Pharmacy Resident 10/29/2016 3:21 PM

## 2016-10-29 NOTE — Progress Notes (Signed)
Nutrition Follow-up  DOCUMENTATION CODES:   Not applicable  INTERVENTION:  1. Advanced diet to SOFT after discussion with MD 2. Will switch from Boost Breeze to Ensure Enlive po BID, each supplement provides 350 kcal and 20 grams of protein  NUTRITION DIAGNOSIS:   Increased nutrient needs related to cancer and cancer related treatments as evidenced by estimated needs. -ongoing  GOAL:   Patient will meet greater than or equal to 90% of their needs -progressing  MONITOR:   PO intake, I & O's, Weight trends, Labs, Supplement acceptance  REASON FOR ASSESSMENT:   Consult, Malnutrition Screening Tool Poor PO  ASSESSMENT:   Gary Hampton  is a 76 y.o. 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  Spoke with Mr. Gary Hampton briefly. He continues to be sleepy and confused. Did not eat any lunch. RN states he did well with Liquids this morning. No documented meal completion thus far. Discussed with MD about advancing his diet to SOFT to help with nutrition status but overall patient may be too lethargic to really eat. Some nausea reported as well.  Labs and medications reviewed: CBGs 136-266 NS @ 117mL/hr  Diet Order:  DIET SOFT Room service appropriate? Yes; Fluid consistency: Thin  Skin:  Reviewed, no issues  Last BM:  12/30  Height:   Ht Readings from Last 1 Encounters:  10/25/16 6\' 2"  (1.88 m)    Weight:   Wt Readings from Last 1 Encounters:  10/29/16 180 lb 1.9 oz (81.7 kg)    Ideal Body Weight:  86.36 kg  BMI:  Body mass index is 23.13 kg/m.  Estimated Nutritional Needs:   Kcal:  2200-2500 calories  Protein:  96-120 gm  Fluid:  >/= 2.2L  EDUCATION NEEDS:   No education needs identified at this time  Gary Hampton. Gary Tobey, MS, RD LDN Inpatient Clinical Dietitian Pager [phone]

## 2016-10-29 NOTE — Progress Notes (Signed)
Gary Lame, MD [facility]   [address], [place], [place] [address] Phone: [phone] Fax : [phone]   Subjective: The patient was admitted with black tarry stools and his ostomy bag. The patient has had clonic resection for a colon cancer. Patient's nurse and the patient denied any further signs of any GI bleeding. He is somewhat confused and answers questions inappropriately. The patient hemoglobin has remained stable.   Objective: Vital signs in last 24 hours: Vitals:   10/28/16 0806 10/28/16 1123 10/28/16 1926 10/29/16 0342  BP: (!) 134/51 (!) 118/46 (!) 110/59 (!) 112/46  Pulse: 87 78 84 86  Resp: 20 18 15 18   Temp: 98.6 F (37 C) 97.6 F (36.4 C) 98 F (36.7 C) 97.8 F (36.6 C)  TempSrc: Oral Oral Oral   SpO2: 97% 100% 100% 99%  Weight:    180 lb 1.9 oz (81.7 kg)  Height:       Weight change: -1 lb 11 oz (-0.764 kg)  Intake/Output Summary (Last 24 hours) at 10/29/16 0955 Last data filed at 10/29/16 0555  Gross per 24 hour  Intake             3130 ml  Output             1425 ml  Net             1705 ml     Exam: Heart:: Regular rate and rhythm, S1S2 present or without murmur or extra heart sounds Lungs: normal and clear to auscultation and percussion Abdomen: Soft nontender and nondistended with a positive ostomy bag on the left side. Without rebound or guarding   Lab Results: @LABTEST2 @ Micro Results: Recent Results (from the past 240 hour(s))  Urine culture     Status: Abnormal   Collection Time: 10/25/16 12:36 PM  Result Value Ref Range Status   Specimen Description URINE, RANDOM  Final   Special Requests NONE  Final   Culture (A)  Final    >=100,000 COLONIES/mL GROUP B STREP(S.AGALACTIAE)ISOLATED TESTING AGAINST S. AGALACTIAE NOT ROUTINELY PERFORMED DUE TO PREDICTABILITY OF AMP/PEN/VAN SUSCEPTIBILITY. Performed at [facility]    Report Status 10/26/2016 FINAL  Final  Blood Culture (routine x 2)     Status: Abnormal (Preliminary  result)   Collection Time: 10/25/16 12:54 PM  Result Value Ref Range Status   Specimen Description BLOOD RIGHT ARM  Final   Special Requests   Final    BOTTLES DRAWN AEROBIC AND ANAEROBIC ANA14ML AER13ML   Culture  Setup Time   Final    GRAM POSITIVE COCCI GRAM NEGATIVE RODS IN BOTH AEROBIC AND ANAEROBIC BOTTLES CONFIRMED BY RWW CRITICAL RESULT CALLED TO, READ BACK BY AND VERIFIED WITH: MATT MCBANE AT [place] ON 10/26/16 RWW    Culture (A)  Final    ESCHERICHIA COLI GROUP B STREP(S.AGALACTIAE)ISOLATED SUSCEPTIBILITIES TO FOLLOW    Report Status PENDING  Incomplete   Organism ID, Bacteria ESCHERICHIA COLI  Final      Susceptibility   Escherichia coli - MIC*    AMPICILLIN <=2 SENSITIVE Sensitive     CEFAZOLIN <=4 SENSITIVE Sensitive     CEFEPIME <=1 SENSITIVE Sensitive     CEFTAZIDIME <=1 SENSITIVE Sensitive     CEFTRIAXONE <=1 SENSITIVE Sensitive     CIPROFLOXACIN <=0.25 SENSITIVE Sensitive     GENTAMICIN <=1 SENSITIVE Sensitive     IMIPENEM <=0.25 SENSITIVE Sensitive     TRIMETH/SULFA <=20 SENSITIVE Sensitive     AMPICILLIN/SULBACTAM <=2 SENSITIVE Sensitive  PIP/TAZO <=4 SENSITIVE Sensitive     Extended ESBL Value in next row Sensitive      NEGATIVEPerformed at [place]  Blood Culture (routine x 2)     Status: Abnormal   Collection Time: 10/25/16 12:54 PM  Result Value Ref Range Status   Specimen Description BLOOD LEFT ARM  Final   Special Requests BOTTLES DRAWN AEROBIC AND ANAEROBIC AER7ML ANA8ML  Final   Culture  Setup Time   Final    GRAM NEGATIVE RODS GRAM POSITIVE COCCI IN BOTH AEROBIC AND ANAEROBIC BOTTLES CRITICAL VALUE NOTED.  VALUE IS CONSISTENT WITH PREVIOUSLY REPORTED AND CALLED VALUE. CONFIRMED BY [facility]    Culture (A)  Final    ESCHERICHIA COLI GROUP B STREP(S.AGALACTIAE)ISOLATED SUSCEPTIBILITIES PERFORMED ON PREVIOUS CULTURE WITHIN THE LAST 5 DAYS. Performed at [facility]    Report Status 10/29/2016 FINAL  Final    Blood Culture ID Panel (Reflexed)     Status: Abnormal   Collection Time: 10/25/16 12:54 PM  Result Value Ref Range Status   Enterococcus species NOT DETECTED NOT DETECTED Final   Listeria monocytogenes NOT DETECTED NOT DETECTED Final   Staphylococcus species NOT DETECTED NOT DETECTED Final   Staphylococcus aureus NOT DETECTED NOT DETECTED Final   Streptococcus species DETECTED (A) NOT DETECTED Final    Comment: CRITICAL RESULT CALLED TO, READ BACK BY AND VERIFIED WITH: MATT MCBANE AT 0159 ON 10/26/16 RWW    Streptococcus agalactiae DETECTED (A) NOT DETECTED Final    Comment: CRITICAL RESULT CALLED TO, READ BACK BY AND VERIFIED WITH: MATT MCBANE AT 0159 ON 10/26/16 RWW    Streptococcus pneumoniae NOT DETECTED NOT DETECTED Final   Streptococcus pyogenes NOT DETECTED NOT DETECTED Final   Acinetobacter baumannii NOT DETECTED NOT DETECTED Final   Enterobacteriaceae species DETECTED (A) NOT DETECTED Final    Comment: CRITICAL RESULT CALLED TO, READ BACK BY AND VERIFIED WITH: MATT MCBANE AT 0159 ON 10/26/16 RWW    Enterobacter cloacae complex NOT DETECTED NOT DETECTED Final   Escherichia coli DETECTED (A) NOT DETECTED Final    Comment: CRITICAL RESULT CALLED TO, READ BACK BY AND VERIFIED WITH: MATT MCBANE AT 0159 ON 10/26/16 RWW    Klebsiella oxytoca NOT DETECTED NOT DETECTED Final   Klebsiella pneumoniae NOT DETECTED NOT DETECTED Final   Proteus species NOT DETECTED NOT DETECTED Final   Serratia marcescens NOT DETECTED NOT DETECTED Final   Carbapenem resistance NOT DETECTED NOT DETECTED Final   Haemophilus influenzae NOT DETECTED NOT DETECTED Final   Neisseria meningitidis NOT DETECTED NOT DETECTED Final   Pseudomonas aeruginosa NOT DETECTED NOT DETECTED Final   Candida albicans NOT DETECTED NOT DETECTED Final   Candida glabrata NOT DETECTED NOT DETECTED Final   Candida krusei NOT DETECTED NOT DETECTED Final   Candida parapsilosis NOT DETECTED NOT DETECTED Final   Candida  tropicalis NOT DETECTED NOT DETECTED Final   Studies/Results: Ct Renal Stone Study  Result Date: 10/28/2016 CLINICAL DATA:  Emphysematous pyelonephritis. History of rectal cancer. EXAM: CT ABDOMEN AND PELVIS WITHOUT CONTRAST TECHNIQUE: Multidetector CT imaging of the abdomen and pelvis was performed following the standard protocol without IV contrast. COMPARISON:  CT scan dated 10/25/2016 FINDINGS: Lower chest: The patient has new bibasilar atelectasis, right greater than left, with small bilateral pleural effusions. Hepatobiliary: There is either sludge or stones in the gallbladder. Liver parenchyma is normal. Pancreas: Unremarkable. No pancreatic ductal dilatation or surrounding inflammatory changes. Spleen: Normal. Adrenals/Urinary Tract: There is increased right  perinephric soft tissue stranding as well as increased gas in the perinephric space extending into the right renal parenchyma. There is increased ascites in the right pericolic gutter extending around the inferior tip of the liver in the gallbladder. Soft tissue stranding extends along the right ureter into the pelvis. Increased ascites in the pelvis. Foley catheter in the bladder. There is increased soft tissue stranding around the left kidney with new small amount of fluid in the left pericolic gutter. 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 Vascular/Lymphatic: Extensive aortic atherosclerosis. No adenopathy. Reproductive: Prostate is unremarkable. Musculoskeletal: No acute or significant osseous findings. IMPRESSION: 1. Progression of right emphysematous pyelonephritis with increased ascites and increased perinephric soft tissue stranding bilaterally. 2. Aortic atherosclerosis. 3. Sludge or stones in the gallbladder. 4. New bibasilar atelectasis and small bilateral pleural effusions, right greater than left. Electronically Signed   By: Lorriane Shire M.D.   On: 10/28/2016 11:55   Medications: I have reviewed the patient's current  medications. Scheduled Meds: . feeding supplement  1 Container Oral TID BM  . insulin aspart  0-15 Units Subcutaneous TID WC  . insulin aspart  0-5 Units Subcutaneous QHS  . loperamide  2 mg Oral BID  . meropenem  1 g Intravenous Q12H  . metoprolol tartrate  50 mg Oral BID  . oxybutynin  10 mg Oral QHS  . oxyCODONE  10 mg Oral Q12H  . pantoprazole (PROTONIX) IV  40 mg Intravenous Q12H  . sodium chloride flush  3 mL Intravenous Q12H  . vancomycin  1,000 mg Intravenous Q24H   Continuous Infusions: . sodium chloride 100 mL/hr at 10/29/16 0213   PRN Meds:.acetaminophen **OR** acetaminophen, albuterol, diphenoxylate-atropine, HYDROcodone-acetaminophen, labetalol, ondansetron **OR** ondansetron (ZOFRAN) IV, polyethylene glycol   Assessment: Active Problems:   Acute renal failure (HCC)   A-fib (HCC)   Gastrointestinal hemorrhage with melena   Vomiting   Fall   Diabetes mellitus ([place])   Anemia    Plan: This patient was admitted with bacteremia and melanotic stools in his ostomy bag. The patient is eating today without any complaints and states he is feeling well. Patient has had previous procedures done by Dr. Bary Castilla. Will hold off on any procedures on this patient at this time and will continue to treat him conservatively.   LOS: 4 days   Gary Hampton 10/29/2016, 9:55 AM

## 2016-10-29 NOTE — Clinical Social Work Note (Signed)
CSW attempted to see patient to complete assessment, patient was sleeping and did not want to wake up.  CSW will attempt to see patient at a later time.  Jones Broom. [place], MSW, [place]  Mon-Fri 8a-4:30p 10/29/2016 5:56 PM

## 2016-10-29 NOTE — Progress Notes (Cosign Needed)
Subjective: Patient reports no change in abdominal discomfort. CT scan showed worsening emphysematous changes in the right kidney and worsening stranding of his left kidney. WBC count is 5.9. Pt is afebrile.. No significant changes through the night.  Good UOP.    Objective: Vital signs in last 24 hours: Temp:  [97.6 F (36.4 C)-98.6 F (37 C)] 97.8 F (36.6 C) (01/02 0342) Pulse Rate:  [78-87] 86 (01/02 0342) Resp:  [15-20] 18 (01/02 0342) BP: (110-134)/(46-59) 112/46 (01/02 0342) SpO2:  [97 %-100 %] 99 % (01/02 0342) Weight:  [180 lb 1.9 oz (81.7 kg)] 180 lb 1.9 oz (81.7 kg) (01/02 0342)  Intake/Output from previous day: 01/01 0701 - 01/02 0700 In: 3130 [P.O.:120; I.V.:2510; IV Piggyback:500] Out: [id number] [Urine:1400; Stool:25] Intake/Output this shift: No intake/output data recorded.  Physical Exam:  Constitutional: Slowed mentation. No acute distress. HEENT: [place] AT, moist mucus membranes. Trachea midline, no masses. Cardiovascular: No clubbing, cyanosis, or edema. Respiratory: Normal respiratory effort, no increased work of breathing. GI: Abdomen is soft, non tender, non distended, no abdominal masses. Liver and spleen not palpable.  No hernias appreciated.  Stool sample for occult testing is not indicated.   GU: No CVA tenderness.  No bladder fullness or masses.   Skin: No rashes, bruises or suspicious lesions. Lymph: No cervical or inguinal adenopathy. Neurologic: Grossly intact, no focal deficits, moving all 4 extremities. Psychiatric: Normal mood and affect.  Lab Results:  Recent Labs  10/27/16 0447 10/28/16 0421  HGB 9.2* 9.1*  HCT 26.7* 26.4*   BMET  Recent Labs  10/27/16 0447 10/28/16 0421 10/29/16 0551  NA 136 137  --   K 3.5 3.6  --   CL 108 107  --   CO2 20* 23  --   GLUCOSE 95 114*  --   BUN 80* 67*  --   CREATININE 2.45* 2.00* 1.82*  CALCIUM 7.0* 7.3*  --    No results for input(s): LABPT, INR in the last 72 hours. No results for input(s):  LABURIN in the last 72 hours. Results for orders placed or performed during the hospital encounter of 10/25/16  Urine culture     Status: Abnormal   Collection Time: 10/25/16 12:36 PM  Result Value Ref Range Status   Specimen Description URINE, RANDOM  Final   Special Requests NONE  Final   Culture (A)  Final    >=100,000 COLONIES/mL GROUP B STREP(S.AGALACTIAE)ISOLATED TESTING AGAINST S. AGALACTIAE NOT ROUTINELY PERFORMED DUE TO PREDICTABILITY OF AMP/PEN/VAN SUSCEPTIBILITY. Performed at [facility]    Report Status 10/26/2016 FINAL  Final  Blood Culture (routine x 2)     Status: Abnormal (Preliminary result)   Collection Time: 10/25/16 12:54 PM  Result Value Ref Range Status   Specimen Description BLOOD RIGHT ARM  Final   Special Requests   Final    BOTTLES DRAWN AEROBIC AND ANAEROBIC ANA14ML AER13ML   Culture  Setup Time   Final    GRAM POSITIVE COCCI GRAM NEGATIVE RODS IN BOTH AEROBIC AND ANAEROBIC BOTTLES CONFIRMED BY [facility] CRITICAL RESULT CALLED TO, READ BACK BY AND VERIFIED WITH: MATT MCBANE AT 0159 ON 10/26/16 RWW    Culture (A)  Final    ESCHERICHIA COLI GROUP B STREP(S.AGALACTIAE)ISOLATED SUSCEPTIBILITIES TO FOLLOW    Report Status PENDING  Incomplete   Organism ID, Bacteria ESCHERICHIA COLI  Final      Susceptibility   Escherichia coli - MIC*    AMPICILLIN <=2 SENSITIVE Sensitive     CEFAZOLIN <=4 SENSITIVE  Sensitive     CEFEPIME <=1 SENSITIVE Sensitive     CEFTAZIDIME <=1 SENSITIVE Sensitive     CEFTRIAXONE <=1 SENSITIVE Sensitive     CIPROFLOXACIN <=0.25 SENSITIVE Sensitive     GENTAMICIN <=1 SENSITIVE Sensitive     IMIPENEM <=0.25 SENSITIVE Sensitive     TRIMETH/SULFA <=20 SENSITIVE Sensitive     AMPICILLIN/SULBACTAM <=2 SENSITIVE Sensitive     PIP/TAZO <=4 SENSITIVE Sensitive     Extended ESBL Value in next row Sensitive      NE[place]Performed at [place]  Blood Culture (routine x 2)     Status: Abnormal (Preliminary  result)   Collection Time: 10/25/16 12:54 PM  Result Value Ref Range Status   Specimen Description BLOOD LEFT ARM  Final   Special Requests BOTTLES DRAWN AEROBIC AND ANAEROBIC AER7ML ANA8ML  Final   Culture  Setup Time   Final    GRAM NEGATIVE RODS GRAM POSITIVE COCCI IN BOTH AEROBIC AND ANAEROBIC BOTTLES CRITICAL VALUE NOTED.  VALUE IS CONSISTENT WITH PREVIOUSLY REPORTED AND CALLED VALUE. CONFIRMED BY [facility]    Culture (A)  Final    ESCHERICHIA COLI GROUP B STREP(S.AGALACTIAE)ISOLATED SUSCEPTIBILITIES PERFORMED ON PREVIOUS CULTURE WITHIN THE LAST 5 DAYS. Performed at [facility]    Report Status PENDING  Incomplete  Blood Culture ID Panel (Reflexed)     Status: Abnormal   Collection Time: 10/25/16 12:54 PM  Result Value Ref Range Status   Enterococcus species NOT DETECTED NOT DETECTED Final   Listeria monocytogenes NOT DETECTED NOT DETECTED Final   Staphylococcus species NOT DETECTED NOT DETECTED Final   Staphylococcus aureus NOT DETECTED NOT DETECTED Final   Streptococcus species DETECTED (A) NOT DETECTED Final    Comment: CRITICAL RESULT CALLED TO, READ BACK BY AND VERIFIED WITH: MATT MCBANE AT 0159 ON 10/26/16 RWW    Streptococcus agalactiae DETECTED (A) NOT DETECTED Final    Comment: CRITICAL RESULT CALLED TO, READ BACK BY AND VERIFIED WITH: MATT MCBANE AT 0159 ON 10/26/16 RWW    Streptococcus pneumoniae NOT DETECTED NOT DETECTED Final   Streptococcus pyogenes NOT DETECTED NOT DETECTED Final   Acinetobacter baumannii NOT DETECTED NOT DETECTED Final   Enterobacteriaceae species DETECTED (A) NOT DETECTED Final    Comment: CRITICAL RESULT CALLED TO, READ BACK BY AND VERIFIED WITH: MATT MCBANE AT 0159 ON 10/26/16 RWW    Enterobacter cloacae complex NOT DETECTED NOT DETECTED Final   Escherichia coli DETECTED (A) NOT DETECTED Final    Comment: CRITICAL RESULT CALLED TO, READ BACK BY AND VERIFIED WITH: MATT MCBANE AT 0159 ON 10/26/16 RWW    Klebsiella oxytoca NOT  DETECTED NOT DETECTED Final   Klebsiella pneumoniae NOT DETECTED NOT DETECTED Final   Proteus species NOT DETECTED NOT DETECTED Final   Serratia marcescens NOT DETECTED NOT DETECTED Final   Carbapenem resistance NOT DETECTED NOT DETECTED Final   Haemophilus influenzae NOT DETECTED NOT DETECTED Final   Neisseria meningitidis NOT DETECTED NOT DETECTED Final   Pseudomonas aeruginosa NOT DETECTED NOT DETECTED Final   Candida albicans NOT DETECTED NOT DETECTED Final   Candida glabrata NOT DETECTED NOT DETECTED Final   Candida krusei NOT DETECTED NOT DETECTED Final   Candida parapsilosis NOT DETECTED NOT DETECTED Final   Candida tropicalis NOT DETECTED NOT DETECTED Final    Studies/Results: Ct Renal Stone Study  Result Date: 10/28/2016 CLINICAL DATA:  Emphysematous pyelonephritis. History of rectal cancer. EXAM: CT ABDOMEN AND PELVIS WITHOUT CONTRAST TECHNIQUE: Multidetector CT imaging  of the abdomen and pelvis was performed following the standard protocol without IV contrast. COMPARISON:  CT scan dated 10/25/2016 FINDINGS: Lower chest: The patient has new bibasilar atelectasis, right greater than left, with small bilateral pleural effusions. Hepatobiliary: There is either sludge or stones in the gallbladder. Liver parenchyma is normal. Pancreas: Unremarkable. No pancreatic ductal dilatation or surrounding inflammatory changes. Spleen: Normal. Adrenals/Urinary Tract: There is increased right perinephric soft tissue stranding as well as increased gas in the perinephric space extending into the right renal parenchyma. There is increased ascites in the right pericolic gutter extending around the inferior tip of the liver in the gallbladder. Soft tissue stranding extends along the right ureter into the pelvis. Increased ascites in the pelvis. Foley catheter in the bladder. There is increased soft tissue stranding around the left kidney with new small amount of fluid in the left pericolic gutter.  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 Vascular/Lymphatic: Extensive aortic atherosclerosis. No adenopathy. Reproductive: Prostate is unremarkable. Musculoskeletal: No acute or significant osseous findings. IMPRESSION: 1. Progression of right emphysematous pyelonephritis with increased ascites and increased perinephric soft tissue stranding bilaterally. 2. Aortic atherosclerosis. 3. Sludge or stones in the gallbladder. 4. New bibasilar atelectasis and small bilateral pleural effusions, right greater than left. Electronically Signed   By: Lorriane Shire M.D.   On: 10/28/2016 11:55    Assessment/Plan: 75yo with bacteremia, emphysematous pyelonephritis, bladder outlet obstruction.  1: emphysematous pyelonephritis with bacteremia: Continue broad spectrum antibiotics.  management of emphysematous pyelonephritis discussed with the patient by Dr. Alyson Ingles which involves nephrectomy. Currently the CT appearance raises the concern for a bilateral process which would render the patient anephric. He does not wish to pursue surgery at this time and wishes to continue medical management. Urology to continue to follow.   LOS: 4 days   Lorris Carducci 10/29/2016, 8:05 AM

## 2016-10-29 NOTE — Progress Notes (Signed)
    Telemetry reviewed, showing NSR with occasional PACs. No further evidence of Afib.

## 2016-10-29 NOTE — Progress Notes (Signed)
VSS, no complaints of pain.  Minimal output out of colostomy.  NSR on monitor.  Poor appetite today, but having no nausea or vomitting.

## 2016-10-29 NOTE — Progress Notes (Signed)
Inpatient Diabetes Program Recommendations  AACE/ADA: New Consensus Statement on Inpatient Glycemic Control (2015)  Target Ranges:  Prepandial:   less than 140 mg/dL      Peak postprandial:   less than 180 mg/dL (1-2 hours)      Critically ill patients:  140 - 180 mg/dL   Results for Gary Hampton, Gary Hampton (MRN [id number]) as of 10/29/2016 10:38  Ref. Range 10/29/2016 07:31  Glucose-Capillary Latest Ref Range: 65 - 99 mg/dL 266 (H)    Admit with: AKI/ Sepsis/ Melena  History: DM, Rectal Cancer  Home DM Meds: Lantus 23 units daily  Current Insulin Orders: Novolog Moderate Correction Scale/ SSI (0-15 units) TID AC + HS      MD- Note AM CBG today elevated to 266 mg/dl.  Patient takes Lantus 23 units daily at home.  Please consider starting Lantus 12 units daily (50% home dose to start)     --Will follow patient during hospitalization--  Wyn Quaker RN, MSN, CDE Diabetes Coordinator Inpatient Glycemic Control Team Team Pager: [phone] (8a-5p)

## 2016-10-29 NOTE — Progress Notes (Signed)
[place] [facility] [place] NAME: Gary Hampton    MR#:  [id number]  DATE OF BIRTH:  1941/03/24  SUBJECTIVE: PT  Is seen at bedside.no complaints but over all appears very sick,  CHIEF COMPLAINT:   Chief Complaint  Patient presents with  . Emesis  . Fall  . Hyperglycemia    REVIEW OF SYSTEMS:   Review of Systems  Constitutional: Positive for malaise/fatigue. Negative for chills and fever.  HENT: Negative for hearing loss.   Eyes: Negative for blurred vision, double vision and photophobia.  Respiratory: Negative for cough, hemoptysis and shortness of breath.   Cardiovascular: Negative for palpitations, orthopnea and leg swelling.  Gastrointestinal: Negative for abdominal pain, diarrhea and vomiting.  Genitourinary: Negative for dysuria and urgency.  Musculoskeletal: Negative for myalgias and neck pain.  Skin: Negative for rash.  Neurological: Positive for weakness. Negative for dizziness, focal weakness, seizures and headaches.  Psychiatric/Behavioral: Negative for memory loss. The patient does not have insomnia.      DRUG ALLERGIES:  No Known Allergies  VITALS:  Blood pressure (!) 120/50, pulse 80, temperature 98 F (36.7 C), temperature source Oral, resp. rate 18, height 6\' 2"  (1.88 m), weight 81.7 kg (180 lb 1.9 oz), SpO2 100 %.  PHYSICAL EXAMINATION:  GENERAL:  76 y.o.-year-old patient lying in the bed with no acute distress.  EYES: Pupils equal, round, reactive to light and accommodation. No scleral icterus. Extraocular muscles intact.  HEENT: Head atraumatic, normocephalic. Oropharynx and nasopharynx clear.  NECK:  Supple, no jugular venous distention. No thyroid enlargement, no tenderness.  LUNGS: Normal breath sounds bilaterally, no wheezing, rales,rhonchi or crepitation. No use of accessory muscles of respiration.  CARDIOVASCULAR: S1, S2 normal. No murmurs, rubs, or gallops.  ABDOMEN: Soft, nontender, nondistended.  Bowel sounds present. No organomegaly or mass. colostomy bag present  On  left  Side  ,filled with some liquid brown stool. EXTREMITIES: No pedal edema, cyanosis, or clubbing.  NEUROLOGIC: Cranial nerves II through XII are intact. Muscle strength 5/5 in all extremities. Sensation intact. Gait not checked.  PSYCHIATRIC: The patient is alert and oriented x 3.  SKIN: No obvious rash, lesion, or ulcer.    LABORATORY PANEL:   CBC  Recent Labs Lab 10/28/16 0421  WBC 5.9  HGB 9.1*  HCT 26.4*  PLT 100*   ------------------------------------------------------------------------------------------------------------------  Chemistries   Recent Labs Lab 10/25/16 1914 10/26/16 0310  10/28/16 0421 10/29/16 0551  NA  --  133*  < > 137  --   K  --  4.1  < > 3.6  --   CL  --  99*  < > 107  --   CO2  --  25  < > 23  --   GLUCOSE  --  215*  < > 114*  --   BUN  --  86*  < > 67*  --   CREATININE  --  2.68*  < > 2.00* 1.82*  CALCIUM  --  7.1*  < > 7.3*  --   MG 1.4*  --   --   --   --   AST  --  37  --   --   --   ALT  --  18  --   --   --   ALKPHOS  --  53  --   --   --   BILITOT  --  0.6  --   --   --   < > =  values in this interval not displayed. ------------------------------------------------------------------------------------------------------------------  Cardiac Enzymes  Recent Labs Lab 10/25/16 1215  TROPONINI 0.05*   ------------------------------------------------------------------------------------------------------------------  RADIOLOGY:  Ct Renal Stone Study  Result Date: 10/28/2016 CLINICAL DATA:  Emphysematous pyelonephritis. History of rectal cancer. EXAM: CT ABDOMEN AND PELVIS WITHOUT CONTRAST TECHNIQUE: Multidetector CT imaging of the abdomen and pelvis was performed following the standard protocol without IV contrast. COMPARISON:  CT scan dated 10/25/2016 FINDINGS: Lower chest: The patient has new bibasilar atelectasis, right greater than left, with small  bilateral pleural effusions. Hepatobiliary: There is either sludge or stones in the gallbladder. Liver parenchyma is normal. Pancreas: Unremarkable. No pancreatic ductal dilatation or surrounding inflammatory changes. Spleen: Normal. Adrenals/Urinary Tract: There is increased right perinephric soft tissue stranding as well as increased gas in the perinephric space extending into the right renal parenchyma. There is increased ascites in the right pericolic gutter extending around the inferior tip of the liver in the gallbladder. Soft tissue stranding extends along the right ureter into the pelvis. Increased ascites in the pelvis. Foley catheter in the bladder. There is increased soft tissue stranding around the left kidney with new small amount of fluid in the left pericolic gutter. 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 Vascular/Lymphatic: Extensive aortic atherosclerosis. No adenopathy. Reproductive: Prostate is unremarkable. Musculoskeletal: No acute or significant osseous findings. IMPRESSION: 1. Progression of right emphysematous pyelonephritis with increased ascites and increased perinephric soft tissue stranding bilaterally. 2. Aortic atherosclerosis. 3. Sludge or stones in the gallbladder. 4. New bibasilar atelectasis and small bilateral pleural effusions, right greater than left. Electronically Signed   By: Lorriane Shire M.D.   On: 10/28/2016 11:55    EKG:   Orders placed or performed during the hospital encounter of 10/25/16  . EKG 12-Lead  . EKG 12-Lead  . EKG 12-Lead  . EKG 12-Lead  . EKG 12-Lead  . EKG 12-Lead    ASSESSMENT AND PLAN:   #1. septic shock /bacteremia;Likely source  renal abscess versus emphysematous pyelonephritis: Blood cultures from 12/29 positive for strep, Escherichia coli; antibiotics changed to Unasyn,no fever,wbc normalized,    CT study today showed worsening of right-sided emphysematous pyelonephritis with increased perinephric stranding bilaterally.   Pt does not want any surgery like nephrectomy as per urology,if condition worsens  With fever ,may need percutaneous nephrostomy tube. History of BPH, and UTIs, urinary retention continue indwelling Foley, .  #2. Shock improving blood pressure is improving, no fevers. Continue IV hydration today.   #3 atrial fibrillation with RVR improved, seen by cardiology, . Secondary to sepsis. Continue aggressive hydration.. Patient EF 55-60%. CHADVASC2 score at least 4; unable to use blood thinners because of malena.  #4 acute renal failure with ATN secondary to sepsis: Patient has bilateral hydronephrosis: Seen by urology, kidney function improving continue Foley catheter to drain, appreciate urology following.   renal Function improving. Cr down from 3.46 to 1.82  #5 diabetes mellitus type 2: Continue SSI with coverage, advanced diet.not started on lantus yet due to his  Poor po intake.  #6 metabolic encephalopathy secondary to sepsis: improving,   #7/ history of rectal cancer status post neoadjuvant chemotherapy, radiation in April of last year. Patient may have presacral soft tissue mass suspicious for possible recurrence and surgical bed:    8.melanotic school ;hb stable;continue  PPI,GI recommends conservative management   .advanced diet.  pt needs EGD before discharge.   All the records are reviewed and case discussed with Care Management/Social Workerr. Management plans discussed with the patient, family and they are in agreement.  CODE STATUS: DNR  TOTAL TIME TAKING CARE OF THIS PATIENT: 40  minutes.   POSSIBLE D/C IN  5-6 DAYS, DEPENDING ON CLINICAL CONDITION.   Epifanio Lesches M.D on 10/29/2016 at 5:02 PM  Between 7am to 6pm - Pager - [phone]  After 6pm go to [email or web address] - password [email or web address] [facility]  Office  [phone]  CC: Primary care physician; Albina Billet, MD   Note: This dictation was prepared with Dragon  dictation along with smaller phrase technology. Any transcriptional errors that result from this process are unintentional.

## 2016-10-29 NOTE — Progress Notes (Signed)
[place] INFECTIOUS DISEASE PROGRESS NOTE Date of Admission:  10/25/2016     ID: Gary Hampton is a 76 y.o. male with  Bacteremia, pyelonephritis Active Problems:   Acute renal failure (HCC)   A-fib (HCC)   Gastrointestinal hemorrhage with melena   Vomiting   Fall   Diabetes mellitus ([place])   Anemia   Nausea and vomiting   Subjective: No fevers, wbc improving.   ROS  Eleven systems are reviewed and negative except per hpi  Medications:  Antibiotics Given (last 72 hours)    Date/Time Action Medication Dose Rate   10/26/16 1715 Given   meropenem (MERREM) IVPB SOLR 1 g 1 g 100 mL/hr   10/27/16 0620 Given   meropenem (MERREM) IVPB SOLR 1 g 1 g 100 mL/hr   10/27/16 1756 Given   meropenem (MERREM) IVPB SOLR 1 g 1 g 100 mL/hr   10/28/16 0513 Given   meropenem (MERREM) IVPB SOLR 1 g 1 g 100 mL/hr   10/28/16 1434 Given   vancomycin (VANCOCIN) IVPB 1000 mg/200 mL premix 1,000 mg 200 mL/hr   10/28/16 1730 Given   meropenem (MERREM) IVPB SOLR 1 g 1 g 100 mL/hr   10/28/16 2046 Given   vancomycin (VANCOCIN) IVPB 1000 mg/200 mL premix 1,000 mg 200 mL/hr   10/29/16 0555 Given   meropenem (MERREM) IVPB SOLR 1 g 1 g 100 mL/hr     . feeding supplement (ENSURE ENLIVE)  237 mL Oral BID BM  . insulin aspart  0-15 Units Subcutaneous TID WC  . insulin aspart  0-5 Units Subcutaneous QHS  . loperamide  2 mg Oral BID  . meropenem  1 g Intravenous Q12H  . metoprolol tartrate  50 mg Oral BID  . oxybutynin  10 mg Oral QHS  . oxyCODONE  10 mg Oral Q12H  . pantoprazole (PROTONIX) IV  40 mg Intravenous Q12H  . sodium chloride flush  3 mL Intravenous Q12H  . vancomycin  1,000 mg Intravenous Q24H    Objective: Vital signs in last 24 hours: Temp:  [97.8 F (36.6 C)-98 F (36.7 C)] 98 F (36.7 C) (01/02 1135) Pulse Rate:  [80-86] 80 (01/02 1135) Resp:  [15-18] 18 (01/02 1135) BP: (110-120)/(46-59) 120/50 (01/02 1135) SpO2:  [99 %-100 %] 100 % (01/02 1135) Weight:  [81.7 kg (180 lb  1.9 oz)] 81.7 kg (180 lb 1.9 oz) (01/02 0342) Constitutional: He is oriented to person, place, and time. He appears well-developed and well-nourished. Frail HENT: anicteric Mouth/Throat: Oropharynx is clear and dry. No oropharyngeal exudate.  Cardiovascular: Normal rate, regular rhythm and normal heart sounds.   Pulmonary/Chest: Effort normal and breath sounds normal. No respiratory distress. He has no wheezes.  Abdominal: Soft. Bowel sounds are normal. He exhibits no distension. Colostomy site with black stool Lymphadenopathy:  He has no cervical adenopathy.  Neurological: He is alert and oriented to person, place, and time.  Skin: Skin is warm and dry. No rash noted. No erythema.  Psychiatric: He has a normal mood and affect. His behavior is normal.   Lab Results  Recent Labs  10/27/16 0447 10/28/16 0421 10/29/16 0551  WBC 8.3 5.9  --   HGB 9.2* 9.1*  --   HCT 26.7* 26.4*  --   NA 136 137  --   K 3.5 3.6  --   CL 108 107  --   CO2 20* 23  --   BUN 80* 67*  --   CREATININE 2.45* 2.00* 1.82*  Microbiology: Results for orders placed or performed during the hospital encounter of 10/25/16  Urine culture     Status: Abnormal   Collection Time: 10/25/16 12:36 PM  Result Value Ref Range Status   Specimen Description URINE, RANDOM  Final   Special Requests NONE  Final   Culture (A)  Final    >=100,000 COLONIES/mL GROUP B STREP(S.AGALACTIAE)ISOLATED TESTING AGAINST S. AGALACTIAE NOT ROUTINELY PERFORMED DUE TO PREDICTABILITY OF AMP/PEN/VAN SUSCEPTIBILITY. Performed at [facility]    Report Status 10/26/2016 FINAL  Final  Blood Culture (routine x 2)     Status: Abnormal   Collection Time: 10/25/16 12:54 PM  Result Value Ref Range Status   Specimen Description BLOOD RIGHT ARM  Final   Special Requests   Final    BOTTLES DRAWN AEROBIC AND ANAEROBIC ANA14ML AER13ML   Culture  Setup Time   Final    GRAM POSITIVE COCCI GRAM NEGATIVE RODS IN BOTH AEROBIC AND ANAEROBIC  BOTTLES CONFIRMED BY RWW CRITICAL RESULT CALLED TO, READ BACK BY AND VERIFIED WITH: MATT [place] AT [place] ON 10/26/16 RWW Performed at [facility]    Culture (A)  Final    ESCHERICHIA COLI GROUP B STREP(S.AGALACTIAE)ISOLATED    Report Status 10/29/2016 FINAL  Final   Organism ID, Bacteria ESCHERICHIA COLI  Final   Organism ID, Bacteria GROUP B STREP(S.AGALACTIAE)ISOLATED  Final      Susceptibility   Escherichia coli - MIC*    AMPICILLIN <=2 SENSITIVE Sensitive     CEFAZOLIN <=4 SENSITIVE Sensitive     CEFEPIME <=1 SENSITIVE Sensitive     CEFTAZIDIME <=1 SENSITIVE Sensitive     CEFTRIAXONE <=1 SENSITIVE Sensitive     CIPROFLOXACIN <=0.25 SENSITIVE Sensitive     GENTAMICIN <=1 SENSITIVE Sensitive     IMIPENEM <=0.25 SENSITIVE Sensitive     TRIMETH/SULFA <=20 SENSITIVE Sensitive     AMPICILLIN/SULBACTAM <=2 SENSITIVE Sensitive     PIP/TAZO <=4 SENSITIVE Sensitive     Extended ESBL NEGATIVE Sensitive     * ESCHERICHIA COLI   Group b strep(s.agalactiae)isolated - MIC*    CLINDAMYCIN <=0.25 SENSITIVE Sensitive     AMPICILLIN <=0.25 SENSITIVE Sensitive     ERYTHROMYCIN <=0.12 SENSITIVE Sensitive     VANCOMYCIN 0.5 SENSITIVE Sensitive     CEFTRIAXONE <=0.12 SENSITIVE Sensitive     LEVOFLOXACIN 1 SENSITIVE Sensitive     * GROUP B STREP(S.AGALACTIAE)ISOLATED  Blood Culture (routine x 2)     Status: Abnormal   Collection Time: 10/25/16 12:54 PM  Result Value Ref Range Status   Specimen Description BLOOD LEFT ARM  Final   Special Requests BOTTLES DRAWN AEROBIC AND ANAEROBIC AER7ML ANA8ML  Final   Culture  Setup Time   Final    GRAM NEGATIVE RODS GRAM POSITIVE COCCI IN BOTH AEROBIC AND ANAEROBIC BOTTLES CRITICAL VALUE NOTED.  VALUE IS CONSISTENT WITH PREVIOUSLY REPORTED AND CALLED VALUE. CONFIRMED BY [facility]    Culture (A)  Final    ESCHERICHIA COLI GROUP B STREP(S.AGALACTIAE)ISOLATED SUSCEPTIBILITIES PERFORMED ON PREVIOUS CULTURE WITHIN THE LAST 5 DAYS. Performed at [facility]    Report Status 10/29/2016 FINAL  Final  Blood Culture ID Panel (Reflexed)     Status: Abnormal   Collection Time: 10/25/16 12:54 PM  Result Value Ref Range Status   Enterococcus species NOT DETECTED NOT DETECTED Final   Listeria monocytogenes NOT DETECTED NOT DETECTED Final   Staphylococcus species NOT DETECTED NOT DETECTED Final   Staphylococcus aureus NOT DETECTED NOT DETECTED Final  Streptococcus species DETECTED (A) NOT DETECTED Final    Comment: CRITICAL RESULT CALLED TO, READ BACK BY AND VERIFIED WITH: MATT MCBANE AT 0159 ON 10/26/16 RWW    Streptococcus agalactiae DETECTED (A) NOT DETECTED Final    Comment: CRITICAL RESULT CALLED TO, READ BACK BY AND VERIFIED WITH: MATT MCBANE AT 0159 ON 10/26/16 RWW    Streptococcus pneumoniae NOT DETECTED NOT DETECTED Final   Streptococcus pyogenes NOT DETECTED NOT DETECTED Final   Acinetobacter baumannii NOT DETECTED NOT DETECTED Final   Enterobacteriaceae species DETECTED (A) NOT DETECTED Final    Comment: CRITICAL RESULT CALLED TO, READ BACK BY AND VERIFIED WITH: MATT MCBANE AT 0159 ON 10/26/16 RWW    Enterobacter cloacae complex NOT DETECTED NOT DETECTED Final   Escherichia coli DETECTED (A) NOT DETECTED Final    Comment: CRITICAL RESULT CALLED TO, READ BACK BY AND VERIFIED WITH: MATT MCBANE AT 0159 ON 10/26/16 RWW    Klebsiella oxytoca NOT DETECTED NOT DETECTED Final   Klebsiella pneumoniae NOT DETECTED NOT DETECTED Final   Proteus species NOT DETECTED NOT DETECTED Final   Serratia marcescens NOT DETECTED NOT DETECTED Final   Carbapenem resistance NOT DETECTED NOT DETECTED Final   Haemophilus influenzae NOT DETECTED NOT DETECTED Final   Neisseria meningitidis NOT DETECTED NOT DETECTED Final   Pseudomonas aeruginosa NOT DETECTED NOT DETECTED Final   Candida albicans NOT DETECTED NOT DETECTED Final   Candida glabrata NOT DETECTED NOT DETECTED Final   Candida krusei NOT DETECTED NOT DETECTED Final   Candida parapsilosis  NOT DETECTED NOT DETECTED Final   Candida tropicalis NOT DETECTED NOT DETECTED Final    Studies/Results: Ct Renal Stone Study  Result Date: 10/28/2016 CLINICAL DATA:  Emphysematous pyelonephritis. History of rectal cancer. EXAM: CT ABDOMEN AND PELVIS WITHOUT CONTRAST TECHNIQUE: Multidetector CT imaging of the abdomen and pelvis was performed following the standard protocol without IV contrast. COMPARISON:  CT scan dated 10/25/2016 FINDINGS: Lower chest: The patient has new bibasilar atelectasis, right greater than left, with small bilateral pleural effusions. Hepatobiliary: There is either sludge or stones in the gallbladder. Liver parenchyma is normal. Pancreas: Unremarkable. No pancreatic ductal dilatation or surrounding inflammatory changes. Spleen: Normal. Adrenals/Urinary Tract: There is increased right perinephric soft tissue stranding as well as increased gas in the perinephric space extending into the right renal parenchyma. There is increased ascites in the right pericolic gutter extending around the inferior tip of the liver in the gallbladder. Soft tissue stranding extends along the right ureter into the pelvis. Increased ascites in the pelvis. Foley catheter in the bladder. There is increased soft tissue stranding around the left kidney with new small amount of fluid in the left pericolic gutter. 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 Vascular/Lymphatic: Extensive aortic atherosclerosis. No adenopathy. Reproductive: Prostate is unremarkable. Musculoskeletal: No acute or significant osseous findings. IMPRESSION: 1. Progression of right emphysematous pyelonephritis with increased ascites and increased perinephric soft tissue stranding bilaterally. 2. Aortic atherosclerosis. 3. Sludge or stones in the gallbladder. 4. New bibasilar atelectasis and small bilateral pleural effusions, right greater than left. Electronically Signed   By: Lorriane Shire M.D.   On: 10/28/2016 11:55     Assessment/Plan: Christhoper Serrette is a 76 y.o. male with GB strep and E coli bacteremia likely from urinary source. He has been seen by urology and had a foley placed due to urinary retention.  CT shows pyelo vs renal abscess. Fu CT seems to show progression of pyelo but clinically improving. I have reviewed imaging with radiology  and seems to be a borderline change in the amt of air on R side  Recommendations Can dc vanco  Can deescalate to unasyn which will cover the e coli, GB strep and possible anaerobes Not a great surgical candidate.  If he worsens would consider perc nephrostomy but not a great deal of pus visibile on imaging and not obstructed Likely will need several weeks of abx - would cont IV until stable then can dc on oral augmentin   Thank you very much for the consult. Will follow with you.  Ruble Pumphrey P   10/29/2016, 1:52 PM

## 2016-10-29 NOTE — Care Management Important Message (Signed)
Important Message  Patient Details  Name: Gary Hampton MRN: [id number] Date of Birth: Sep 25, 1941   Medicare Important Message Given:  Yes Initial signed IM printed from Epic and given to patient.    Katrina Stack, RN 10/29/2016, 9:38 AM

## 2016-10-30 DIAGNOSIS — R1110 Vomiting, unspecified: Secondary | ICD-10-CM

## 2016-10-30 DIAGNOSIS — R739 Hyperglycemia, unspecified: Secondary | ICD-10-CM

## 2016-10-30 DIAGNOSIS — E871 Hypo-osmolality and hyponatremia: Secondary | ICD-10-CM

## 2016-10-30 DIAGNOSIS — N19 Unspecified kidney failure: Secondary | ICD-10-CM

## 2016-10-30 DIAGNOSIS — N12 Tubulo-interstitial nephritis, not specified as acute or chronic: Secondary | ICD-10-CM

## 2016-10-30 LAB — BASIC METABOLIC PANEL
ANION GAP: 7 (ref 5–15)
BUN: 57 mg/dL — ABNORMAL HIGH (ref 6–20)
CO2: 21 mmol/L — ABNORMAL LOW (ref 22–32)
Calcium: 7.4 mg/dL — ABNORMAL LOW (ref 8.9–10.3)
Chloride: 106 mmol/L (ref 101–111)
Creatinine, Ser: 2.17 mg/dL — ABNORMAL HIGH (ref 0.61–1.24)
GFR calc Af Amer: 32 mL/min — ABNORMAL LOW (ref >60)
GFR, EST NON AFRICAN AMERICAN: 28 mL/min — AB (ref >60)
GLUCOSE: 131 mg/dL — AB (ref 65–99)
POTASSIUM: 3.9 mmol/L (ref 3.5–5.1)
Sodium: 134 mmol/L — ABNORMAL LOW (ref 135–145)

## 2016-10-30 LAB — URINALYSIS, COMPLETE (UACMP) WITH MICROSCOPIC
BACTERIA UA: NONE SEEN
Bilirubin Urine: NEGATIVE
GLUCOSE, UA: NEGATIVE mg/dL
Ketones, ur: 5 mg/dL — AB
Nitrite: NEGATIVE
PH: 5 (ref 5.0–8.0)
Protein, ur: 100 mg/dL — AB
SPECIFIC GRAVITY, URINE: 1.015 (ref 1.005–1.030)
Squamous Epithelial / LPF: NONE SEEN

## 2016-10-30 LAB — GLUCOSE, CAPILLARY
GLUCOSE-CAPILLARY: 175 mg/dL — AB (ref 65–99)
GLUCOSE-CAPILLARY: 143 mg/dL — AB (ref 65–99)
Glucose-Capillary: 137 mg/dL — ABNORMAL HIGH (ref 65–99)
Glucose-Capillary: 130 mg/dL — ABNORMAL HIGH (ref 65–99)

## 2016-10-30 MED ORDER — PANTOPRAZOLE SODIUM 40 MG PO TBEC
40.0000 mg | DELAYED_RELEASE_TABLET | Freq: Two times a day (BID) | ORAL | Status: DC
  Administered 2016-10-30 – 2016-11-01 (×4): 40 mg via ORAL
  Filled 2016-10-30 (×4): qty 1

## 2016-10-30 MED ORDER — HEPARIN SODIUM (PORCINE) 5000 UNIT/ML IJ SOLN
5000.0000 [IU] | Freq: Three times a day (TID) | INTRAMUSCULAR | Status: DC
  Administered 2016-10-30 – 2016-11-01 (×6): 5000 [IU] via SUBCUTANEOUS
  Filled 2016-10-30 (×6): qty 1

## 2016-10-30 NOTE — Progress Notes (Signed)
PT Cancellation Note  Patient Details Name: Gary Hampton MRN: [id number] DOB: [birth year]   Cancelled Treatment:    Reason Eval/Treat Not Completed: Patient declined, no reason specified. Treatment attempted and encouraged. Pt refuses despite attempts to encourage participation. Pt states "I'm fine, I am really not interested in doing any physical therapy." Re attempt at a later date.    Larae Grooms, PTA 10/30/2016, 4:22 PM

## 2016-10-30 NOTE — Progress Notes (Signed)
Urology consult f/u  10/30/16  Subjective: Creatinine up today to 2.17 from 1.82 yesterday but overall improved from admission of 3.46.  No recent CBC, previously no leukocytosis. Hemodynamically stable. Afebrile. No complaints including no flank pain.   No nausea or vomiting.  Eating lunch upon my examination.  Seen and evaluated by infectious disease, Dr. Ola Spurr following.  Objective: Vital signs in last 24 hours: Temp:  [97.4 F (36.3 C)-98.4 F (36.9 C)] 98.4 F (36.9 C) (01/03 1131) Pulse Rate:  [40-96] 85 (01/03 1131) Resp:  [16-18] 18 (01/03 1131) BP: (120-126)/(44-56) 125/44 (01/03 1131) SpO2:  [91 %-99 %] 99 % (01/03 1131) Weight:  [189 lb 12.8 oz (86.1 kg)] 189 lb 12.8 oz (86.1 kg) (01/03 0428)  Intake/Output from previous day: 01/02 0701 - 01/03 0700 In: 1806.7 [P.O.:120; I.V.:1386.7; IV Piggyback:300] Out: 750 [Urine:350; Stool:400] Intake/Output this shift: Total I/O In: 120 [P.O.:120] Out: -   Physical Exam:  Constitutional: Slowed mentation. No acute distress. HEENT: [place] AT, moist mucus membranes. Trachea midline, no masses. Respiratory: Normal respiratory effort, no increased work of breathing. GI: Abdomen is soft, non tender, non distended, no abdominal masses.   Colostomy in place. GU: No CVA tenderness.   Skin: Skin changes noted on bilateral toes. Lymph: No cervical or inguinal adenopathy. Neurologic: Grossly intact, no focal deficits, moving all 4 extremities. Psychiatric: Normal mood and affect.  Lab Results:  Recent Labs  10/28/16 0421  HGB 9.1*  HCT 26.4*   BMET  Recent Labs  10/28/16 0421 10/29/16 0551 10/30/16 0711  NA 137  --  134*  K 3.6  --  3.9  CL 107  --  106  CO2 23  --  21*  GLUCOSE 114*  --  131*  BUN 67*  --  57*  CREATININE 2.00* 1.82* 2.17*  CALCIUM 7.3*  --  7.4*   No results for input(s): LABPT, INR in the last 72 hours. No results for input(s): LABURIN in the last 72 hours. Results for orders placed or  performed during the hospital encounter of 10/25/16  Urine culture     Status: Abnormal   Collection Time: 10/25/16 12:36 PM  Result Value Ref Range Status   Specimen Description URINE, RANDOM  Final   Special Requests NONE  Final   Culture (A)  Final    >=100,000 COLONIES/mL GROUP B STREP(S.AGALACTIAE)ISOLATED TESTING AGAINST S. AGALACTIAE NOT ROUTINELY PERFORMED DUE TO PREDICTABILITY OF AMP/PEN/VAN SUSCEPTIBILITY. Performed at [facility]    Report Status 10/26/2016 FINAL  Final  Blood Culture (routine x 2)     Status: Abnormal   Collection Time: 10/25/16 12:54 PM  Result Value Ref Range Status   Specimen Description BLOOD RIGHT ARM  Final   Special Requests   Final    BOTTLES DRAWN AEROBIC AND ANAEROBIC ANA14ML AER13ML   Culture  Setup Time   Final    GRAM POSITIVE COCCI GRAM NEGATIVE RODS IN BOTH AEROBIC AND ANAEROBIC BOTTLES CONFIRMED BY RWW CRITICAL RESULT CALLED TO, READ BACK BY AND VERIFIED WITH: MATT [place]CB[place] AT [place] ON 10/26/16 RWW Performed at [facility]    Culture (A)  Final    ESCHERICHIA COLI GROUP B STREP(S.AGALACTIAE)ISOLATED    Report Status 10/29/2016 FINAL  Final   Organism ID, Bacteria ESCHERICHIA COLI  Final   Organism ID, Bacteria GROUP B STREP(S.AGALACTIAE)ISOLATED  Final      Susceptibility   Escherichia coli - MIC*    AMPICILLIN <=2 SENSITIVE Sensitive     CEFAZOLIN <=4  SENSITIVE Sensitive     CEFEPIME <=1 SENSITIVE Sensitive     CEFTAZIDIME <=1 SENSITIVE Sensitive     CEFTRIAXONE <=1 SENSITIVE Sensitive     CIPROFLOXACIN <=0.25 SENSITIVE Sensitive     GENTAMICIN <=1 SENSITIVE Sensitive     IMIPENEM <=0.25 SENSITIVE Sensitive     TRIMETH/SULFA <=20 SENSITIVE Sensitive     AMPICILLIN/SULBACTAM <=2 SENSITIVE Sensitive     PIP/TAZO <=4 SENSITIVE Sensitive     Extended ESBL NEGATIVE Sensitive     * ESCHERICHIA COLI   Group b strep(s.agalactiae)isolated - MIC*    CLINDAMYCIN <=0.25 SENSITIVE Sensitive     AMPICILLIN <=0.25 SENSITIVE  Sensitive     ERYTHROMYCIN <=0.12 SENSITIVE Sensitive     VANCOMYCIN 0.5 SENSITIVE Sensitive     CEFTRIAXONE <=0.12 SENSITIVE Sensitive     LEVOFLOXACIN 1 SENSITIVE Sensitive     * GROUP B STREP(S.AGALACTIAE)ISOLATED  Blood Culture (routine x 2)     Status: Abnormal   Collection Time: 10/25/16 12:54 PM  Result Value Ref Range Status   Specimen Description BLOOD LEFT ARM  Final   Special Requests BOTTLES DRAWN AEROBIC AND ANAEROBIC AER7ML ANA8ML  Final   Culture  Setup Time   Final    GRAM NEGATIVE RODS GRAM POSITIVE COCCI IN BOTH AEROBIC AND ANAEROBIC BOTTLES CRITICAL VALUE NOTED.  VALUE IS CONSISTENT WITH PREVIOUSLY REPORTED AND CALLED VALUE. CONFIRMED BY [facility]    Culture (A)  Final    ESCHERICHIA COLI GROUP B STREP(S.AGALACTIAE)ISOLATED SUSCEPTIBILITIES PERFORMED ON PREVIOUS CULTURE WITHIN THE LAST 5 DAYS. Performed at [facility]    Report Status 10/29/2016 FINAL  Final  Blood Culture ID Panel (Reflexed)     Status: Abnormal   Collection Time: 10/25/16 12:54 PM  Result Value Ref Range Status   Enterococcus species NOT DETECTED NOT DETECTED Final   Listeria monocytogenes NOT DETECTED NOT DETECTED Final   Staphylococcus species NOT DETECTED NOT DETECTED Final   Staphylococcus aureus NOT DETECTED NOT DETECTED Final   Streptococcus species DETECTED (A) NOT DETECTED Final    Comment: CRITICAL RESULT CALLED TO, READ BACK BY AND VERIFIED WITH: MATT MCBANE AT 0159 ON 10/26/16 RWW    Streptococcus agalactiae DETECTED (A) NOT DETECTED Final    Comment: CRITICAL RESULT CALLED TO, READ BACK BY AND VERIFIED WITH: MATT MCBANE AT 0159 ON 10/26/16 RWW    Streptococcus pneumoniae NOT DETECTED NOT DETECTED Final   Streptococcus pyogenes NOT DETECTED NOT DETECTED Final   Acinetobacter baumannii NOT DETECTED NOT DETECTED Final   Enterobacteriaceae species DETECTED (A) NOT DETECTED Final    Comment: CRITICAL RESULT CALLED TO, READ BACK BY AND VERIFIED WITH: MATT MCBANE AT 0159 ON  10/26/16 RWW    Enterobacter cloacae complex NOT DETECTED NOT DETECTED Final   Escherichia coli DETECTED (A) NOT DETECTED Final    Comment: CRITICAL RESULT CALLED TO, READ BACK BY AND VERIFIED WITH: MATT MCBANE AT 0159 ON 10/26/16 RWW    Klebsiella oxytoca NOT DETECTED NOT DETECTED Final   Klebsiella pneumoniae NOT DETECTED NOT DETECTED Final   Proteus species NOT DETECTED NOT DETECTED Final   Serratia marcescens NOT DETECTED NOT DETECTED Final   Carbapenem resistance NOT DETECTED NOT DETECTED Final   Haemophilus influenzae NOT DETECTED NOT DETECTED Final   Neisseria meningitidis NOT DETECTED NOT DETECTED Final   Pseudomonas aeruginosa NOT DETECTED NOT DETECTED Final   Candida albicans NOT DETECTED NOT DETECTED Final   Candida glabrata NOT DETECTED NOT DETECTED Final   Candida krusei NOT DETECTED NOT DETECTED Final  Candida parapsilosis NOT DETECTED NOT DETECTED Final   Candida tropicalis NOT DETECTED NOT DETECTED Final  CULTURE, BLOOD (ROUTINE X 2) w Reflex to ID Panel     Status: None (Preliminary result)   Collection Time: 10/29/16  5:41 PM  Result Value Ref Range Status   Specimen Description BLOOD  LRFT AC  Final   Special Requests   Final    BOTTLES DRAWN AEROBIC AND ANAEROBIC  AER 11 ML ANA 8 ML   Culture NO GROWTH < 24 HOURS  Final   Report Status PENDING  Incomplete  CULTURE, BLOOD (ROUTINE X 2) w Reflex to ID Panel     Status: None (Preliminary result)   Collection Time: 10/29/16  5:48 PM  Result Value Ref Range Status   Specimen Description BLOOD RIGHT HAND  Final   Special Requests   Final    BOTTLES DRAWN AEROBIC AND ANAEROBIC  AER 9 ML ANA 9 ML   Culture NO GROWTH < 24 HOURS  Final   Report Status PENDING  Incomplete    Studies/Results: No results found.  Assessment/Plan: 76 yo with bacteremia, emphysematous pyelonephritis, bladder outlet obstruction.  1: Emphysematous pyelonephritis with bacteremia (E. Coli/ GBS)- Repeat blood cultures negative. Currently  on Unasyn. Clinically stable, afebrile, normotensive.  Strongly consider right percutaneous nephrostomy tube if patient began to decompensate.  Nephrectomy previously discussed, given that the disease is bilateral, would render the patient essentially anephric. Patient refuses intervention which is reasonable.  Consider repeat imaging a patient deteriorates.  We will continue to follow closely    LOS: 5 days   Hollice Espy 10/30/2016, 2:04 PM

## 2016-10-30 NOTE — Consult Note (Signed)
Date: 10/30/2016                  Patient Name:  Gary Hampton  MRN: [id number]  DOB: 12/17/1940  Age / Sex: 75 y.o., male         PCP: Albina Billet, MD                 Service Requesting Consult: Internal medicine/ Dr Tressia Miners                 Reason for Consult: ARF, Edema            History of Present Illness: Patient is a 76 y.o. male with medical problems of Rectal cancer, status post colectomy, currently with a colostomy bag, insulin-dependent diabetes, who was admitted to [facility] on 10/25/2016  He presented by EMS after his neighbor's called ambulance. He fell down 4 days ago and did not remember how. He is not able to provide any meaningful information. All information is obtained from the chart  Baseline creatinine appears to be 0.95 from December 2016 Presenting creatinine was 3.46/GFR 16 Sodium was low at 127, CK elevated at 654 With IV hydration, serum creatinine has improved to 2.17, GFR 28 sodium level has improved to 134    Medications: Outpatient medications: Prescriptions Prior to Admission  Medication Sig Dispense Refill Last Dose  . acetaminophen (TYLENOL) 325 MG tablet Take 2 tablets (650 mg total) by mouth every 6 (six) hours as needed for mild pain (or temp > 100). 100 tablet 12 unknown at unknown  . diphenoxylate-atropine (LOMOTIL) 2.5-0.025 MG tablet Take by mouth every 6 (six) hours as needed for diarrhea or loose stools.   unknown at unknown  . LANTUS SOLOSTAR 100 UNIT/ML Solostar Pen Inject 23 Units into the skin daily with lunch.    unknown at unknown  . loperamide (IMODIUM) 2 MG capsule Take by mouth 2 (two) times daily.   unknown at unknown  . Melatonin 10 MG CAPS Take by mouth at bedtime.   unknown at unknown  . oxybutynin (DITROPAN-XL) 10 MG 24 hr tablet Take 10 mg by mouth at bedtime.   unknown at unknown  . oxyCODONE (OXYCONTIN) 10 mg 12 hr tablet Take 10 mg by mouth every 8 (eight) hours as needed.   unknown at unknown  . oxyCODONE-acetaminophen  (PERCOCET) 10-650 MG tablet Take 1 tablet by mouth at bedtime and may repeat dose one time if needed. (Patient not taking: Reported on 10/25/2016) 120 tablet 0 Not Taking at Unknown time    Current medications: Current Facility-Administered Medications  Medication Dose Route Frequency Provider Last Rate Last Dose  . acetaminophen (TYLENOL) tablet 650 mg  650 mg Oral Q6H PRN Hillary Bow, MD       Or  . acetaminophen (TYLENOL) suppository 650 mg  650 mg Rectal Q6H PRN Srikar Sudini, MD      . albuterol (PROVENTIL) (2.5 MG/3ML) 0.083% nebulizer solution 2.5 mg  2.5 mg Nebulization Q2H PRN Srikar Sudini, MD      . Ampicillin-Sulbactam (UNASYN) 3 g in sodium chloride 0.9 % 100 mL IVPB  3 g Intravenous Q6H Leonel Ramsay, MD   3 g at 10/30/16 1654  . diphenoxylate-atropine (LOMOTIL) 2.5-0.025 MG per tablet 1 tablet  1 tablet Oral QID PRN Srikar Sudini, MD      . feeding supplement (ENSURE ENLIVE) (ENSURE ENLIVE) liquid 237 mL  237 mL Oral BID BM Epifanio Lesches, MD      . heparin injection  5,000 Units  5,000 Units Subcutaneous Q8H Gladstone Lighter, MD   5,000 Units at 10/30/16 1549  . HYDROcodone-acetaminophen (NORCO/VICODIN) 5-325 MG per tablet 1-2 tablet  1-2 tablet Oral Q4H PRN Srikar Sudini, MD      . insulin aspart (novoLOG) injection 0-15 Units  0-15 Units Subcutaneous TID WC Hillary Bow, MD   2 Units at 10/30/16 1655  . insulin aspart (novoLOG) injection 0-5 Units  0-5 Units Subcutaneous QHS Hillary Bow, MD   3 Units at 10/25/16 2229  . labetalol (NORMODYNE,TRANDATE) injection 10 mg  10 mg Intravenous Q6H PRN Srikar Sudini, MD      . metoprolol (LOPRESSOR) tablet 50 mg  50 mg Oral BID Hillary Bow, MD   50 mg at 10/30/16 1015  . ondansetron (ZOFRAN) tablet 4 mg  4 mg Oral Q6H PRN Hillary Bow, MD       Or  . ondansetron (ZOFRAN) injection 4 mg  4 mg Intravenous Q6H PRN Srikar Sudini, MD      . oxybutynin (DITROPAN-XL) 24 hr tablet 10 mg  10 mg Oral QHS Hillary Bow, MD   10  mg at 10/29/16 2107  . oxyCODONE (OXYCONTIN) 12 hr tablet 10 mg  10 mg Oral Q12H Srikar Sudini, MD   10 mg at 10/30/16 1015  . pantoprazole (PROTONIX) EC tablet 40 mg  40 mg Oral BID Gladstone Lighter, MD      . polyethylene glycol (MIRALAX / GLYCOLAX) packet 17 g  17 g Oral Daily PRN Srikar Sudini, MD      . sodium chloride flush (NS) 0.9 % injection 3 mL  3 mL Intravenous Q12H Srikar Sudini, MD   3 mL at 10/30/16 1016      Allergies: No Known Allergies    Past Medical History: Past Medical History:  Diagnosis Date  . Cancer (HCC)    Rectal  . Colon polyp   . Diabetes mellitus without complication ([place])   . Hard of hearing      Past Surgical History: Past Surgical History:  Procedure Laterality Date  . ABDOMINAL PERINEAL BOWEL RESECTION N/A 07/21/2015   Procedure: Removal of the rectum, anus, and right colon; formation of permanent colostomy;  Surgeon: Robert Bellow, MD;  Location: [facility] ORS;  Service: General;  Laterality: N/A;  . CATARACT EXTRACTION Right   . COLONOSCOPY  2013   Dr. Dionne Milo  . COLONOSCOPY N/A 03/15/2015   Procedure: COLONOSCOPY;  Surgeon: Robert Bellow, MD;  Location[facility] ENDOSCOPY;  Service: Endoscopy;  Laterality: N/A;  . COLONOSCOPY WITH PROPOFOL N/A 08/21/2016   Procedure: COLONOSCOPY WITH PROPOFOL;  Surgeon: Robert Bellow, MD;  Location: [facility] ENDOSCOPY;  Service: Endoscopy;  Laterality: N/A;  . FLEXIBLE SIGMOIDOSCOPY N/A 03/09/2015   Procedure: FLEXIBLE SIGMOIDOSCOPY/rectal biopsy;  Surgeon: Robert Bellow, MD;  Location: [facility] ORS;  Service: General;  Laterality: N/A;  . NO PAST SURGERIES    . PORTACATH PLACEMENT Right 03/09/2015   Procedure: INSERTION PORT-A-CATH;  Surgeon: Robert Bellow, MD;  Location: [facility] ORS;  Service: General;  Laterality: Right;     Family History: History reviewed. No pertinent family history.   Social History: Social History   Social History  . Marital status: Divorced    Spouse name: N/A  . Number  of children: N/A  . Years of education: N/A   Occupational History  . Not on file.   Social History Main Topics  . Smoking status: Former Smoker    Packs/day: 1.00    Years: 3.00  Types: Cigarettes, Pipe, Cigars    Quit date: 10/28/1978  . Smokeless tobacco: Never Used  . Alcohol use 1.8 oz/week    3 Shots of liquor per week  . Drug use: No  . Sexual activity: Not on file   Other Topics Concern  . Not on file   Social History Narrative  . No narrative on file     Review of Systems:Not reliable  Gen:  HEENT:  CV:  Resp:  GI: GU :  MS:  Derm:   Psych: Heme:  Neuro:  Endocrine  Vital Signs: Blood pressure (!) 125/44, pulse 85, temperature 98.4 F (36.9 C), resp. rate 18, height 6\' 2"  (1.88 m), weight 86.1 kg (189 lb 12.8 oz), SpO2 99 %.   Intake/Output Summary (Last 24 hours) at 10/30/16 1739 Last data filed at 10/30/16 1004  Gross per 24 hour  Intake             1065 ml  Output              750 ml  Net              315 ml    Weight trends: Filed Weights   10/28/16 0435 10/29/16 0342 10/30/16 0428  Weight: 82.5 kg (181 lb 12.8 oz) 81.7 kg (180 lb 1.9 oz) 86.1 kg (189 lb 12.8 oz)    Physical Exam: General:  Chronically ill-appearing gentleman, laying in the bed  HEENT Anicteric, moist oral mucous membranes, decreased hearing  Neck:  Supple   Lungs: normal breathing effort, nasal cannula oxygen, mild basilar crackles  Heart::  No rub or gallop  Abdomen: Colostomy in place, soft, nontender  Extremities:  Generalized edema, feet and soft boot support  Neurologic: Alert, able to answer a few questions  Skin: Scattered bruises     Foley: Present       Lab results: Basic Metabolic Panel:  Recent Labs Lab 10/25/16 1914  10/27/16 0447 10/28/16 0421 10/29/16 0551 10/30/16 0711  NA  --   < > 136 137  --  134*  K  --   < > 3.5 3.6  --  3.9  CL  --   < > 108 107  --  106  CO2  --   < > 20* 23  --  21*  GLUCOSE  --   < > 95 114*  --  131*  BUN   --   < > 80* 67*  --  57*  CREATININE  --   < > 2.45* 2.00* 1.82* 2.17*  CALCIUM  --   < > 7.0* 7.3*  --  7.4*  MG 1.4*  --   --   --   --   --   < > = values in this interval not displayed.  Liver Function Tests:  Recent Labs Lab 10/26/16 0310  AST 37  ALT 18  ALKPHOS 53  BILITOT 0.6  PROT 5.0*  ALBUMIN 2.3*    Recent Labs Lab 10/25/16 1215  LIPASE <10*   No results for input(s): AMMONIA in the last 168 hours.  CBC:  Recent Labs Lab 10/25/16 1215  10/27/16 0447 10/28/16 0421  WBC 11.1*  < > 8.3 5.9  NEUTROABS 10.4*  --   --   --   HGB 11.9*  < > 9.2* 9.1*  HCT 34.7*  < > 26.7* 26.4*  MCV 92.1  < > 91.1 91.3  PLT 160  < > 101* 100*  < > =  values in this interval not displayed.  Cardiac Enzymes:  Recent Labs Lab 10/25/16 1215  CKTOTAL 654*  TROPONINI 0.05*    BNP: Invalid input(s): POCBNP  CBG:  Recent Labs Lab 10/29/16 1625 10/29/16 2105 10/30/16 0722 10/30/16 1127 10/30/16 1615  GLUCAP 99 173* 175* 137* 143*    Microbiology: Recent Results (from the past 720 hour(s))  Urine culture     Status: Abnormal   Collection Time: 10/25/16 12:36 PM  Result Value Ref Range Status   Specimen Description URINE, RANDOM  Final   Special Requests NONE  Final   Culture (A)  Final    >=100,000 COLONIES/mL GROUP B STREP(S.AGALACTIAE)ISOLATED TESTING AGAINST S. AGALACTIAE NOT ROUTINELY PERFORMED DUE TO PREDICTABILITY OF AMP/PEN/VAN SUSCEPTIBILITY. Performed at [facility]    Report Status 10/26/2016 FINAL  Final  Blood Culture (routine x 2)     Status: Abnormal   Collection Time: 10/25/16 12:54 PM  Result Value Ref Range Status   Specimen Description BLOOD RIGHT ARM  Final   Special Requests   Final    BOTTLES DRAWN AEROBIC AND ANAEROBIC ANA14ML AER13ML   Culture  Setup Time   Final    GRAM POSITIVE COCCI GRAM NEGATIVE RODS IN BOTH AEROBIC AND ANAEROBIC BOTTLES CONFIRMED BY RWW CRITICAL RESULT CALLED TO, READ BACK BY AND VERIFIED WITH: MATT  En[place] AT [place] ON 10/26/16 RWW Performed at [facility]    Culture (A)  Final    ESCHERICHIA COLI GROUP B STREP(S.AGALACTIAE)ISOLATED    Report Status 10/29/2016 FINAL  Final   Organism ID, Bacteria ESCHERICHIA COLI  Final   Organism ID, Bacteria GROUP B STREP(S.AGALACTIAE)ISOLATED  Final      Susceptibility   Escherichia coli - MIC*    AMPICILLIN <=2 SENSITIVE Sensitive     CEFAZOLIN <=4 SENSITIVE Sensitive     CEFEPIME <=1 SENSITIVE Sensitive     CEFTAZIDIME <=1 SENSITIVE Sensitive     CEFTRIAXONE <=1 SENSITIVE Sensitive     CIPROFLOXACIN <=0.25 SENSITIVE Sensitive     GENTAMICIN <=1 SENSITIVE Sensitive     IMIPENEM <=0.25 SENSITIVE Sensitive     TRIMETH/SULFA <=20 SENSITIVE Sensitive     AMPICILLIN/SULBACTAM <=2 SENSITIVE Sensitive     PIP/TAZO <=4 SENSITIVE Sensitive     Extended ESBL NEGATIVE Sensitive     * ESCHERICHIA COLI   Group b strep(s.agalactiae)isolated - MIC*    CLINDAMYCIN <=0.25 SENSITIVE Sensitive     AMPICILLIN <=0.25 SENSITIVE Sensitive     ERYTHROMYCIN <=0.12 SENSITIVE Sensitive     VANCOMYCIN 0.5 SENSITIVE Sensitive     CEFTRIAXONE <=0.12 SENSITIVE Sensitive     LEVOFLOXACIN 1 SENSITIVE Sensitive     * GROUP B STREP(S.AGALACTIAE)ISOLATED  Blood Culture (routine x 2)     Status: Abnormal   Collection Time: 10/25/16 12:54 PM  Result Value Ref Range Status   Specimen Description BLOOD LEFT ARM  Final   Special Requests BOTTLES DRAWN AEROBIC AND ANAEROBIC AER7ML ANA8ML  Final   Culture  Setup Time   Final    GRAM NEGATIVE RODS GRAM POSITIVE COCCI IN BOTH AEROBIC AND ANAEROBIC BOTTLES CRITICAL VALUE NOTED.  VALUE IS CONSISTENT WITH PREVIOUSLY REPORTED AND CALLED VALUE. CONFIRMED BY [facility]    Culture (A)  Final    ESCHERICHIA COLI GROUP B STREP(S.AGALACTIAE)ISOLATED SUSCEPTIBILITIES PERFORMED ON PREVIOUS CULTURE WITHIN THE LAST 5 DAYS. Performed at [facility]    Report Status 10/29/2016 FINAL  Final  Blood Culture ID Panel (Reflexed)      Status: Abnormal  Collection Time: 10/25/16 12:54 PM  Result Value Ref Range Status   Enterococcus species NOT DETECTED NOT DETECTED Final   Listeria monocytogenes NOT DETECTED NOT DETECTED Final   Staphylococcus species NOT DETECTED NOT DETECTED Final   Staphylococcus aureus NOT DETECTED NOT DETECTED Final   Streptococcus species DETECTED (A) NOT DETECTED Final    Comment: CRITICAL RESULT CALLED TO, READ BACK BY AND VERIFIED WITH: MATT MCBANE AT 0159 ON 10/26/16 RWW    Streptococcus agalactiae DETECTED (A) NOT DETECTED Final    Comment: CRITICAL RESULT CALLED TO, READ BACK BY AND VERIFIED WITH: MATT MCBANE AT 0159 ON 10/26/16 RWW    Streptococcus pneumoniae NOT DETECTED NOT DETECTED Final   Streptococcus pyogenes NOT DETECTED NOT DETECTED Final   Acinetobacter baumannii NOT DETECTED NOT DETECTED Final   Enterobacteriaceae species DETECTED (A) NOT DETECTED Final    Comment: CRITICAL RESULT CALLED TO, READ BACK BY AND VERIFIED WITH: MATT MCBANE AT 0159 ON 10/26/16 RWW    Enterobacter cloacae complex NOT DETECTED NOT DETECTED Final   Escherichia coli DETECTED (A) NOT DETECTED Final    Comment: CRITICAL RESULT CALLED TO, READ BACK BY AND VERIFIED WITH: MATT MCBANE AT 0159 ON 10/26/16 RWW    Klebsiella oxytoca NOT DETECTED NOT DETECTED Final   Klebsiella pneumoniae NOT DETECTED NOT DETECTED Final   Proteus species NOT DETECTED NOT DETECTED Final   Serratia marcescens NOT DETECTED NOT DETECTED Final   Carbapenem resistance NOT DETECTED NOT DETECTED Final   Haemophilus influenzae NOT DETECTED NOT DETECTED Final   Neisseria meningitidis NOT DETECTED NOT DETECTED Final   Pseudomonas aeruginosa NOT DETECTED NOT DETECTED Final   Candida albicans NOT DETECTED NOT DETECTED Final   Candida glabrata NOT DETECTED NOT DETECTED Final   Candida krusei NOT DETECTED NOT DETECTED Final   Candida parapsilosis NOT DETECTED NOT DETECTED Final   Candida tropicalis NOT DETECTED NOT DETECTED Final   CULTURE, BLOOD (ROUTINE X 2) w Reflex to ID Panel     Status: None (Preliminary result)   Collection Time: 10/29/16  5:41 PM  Result Value Ref Range Status   Specimen Description BLOOD  LRFT [facility]  Final   Special Requests   Final    BOTTLES DRAWN AEROBIC AND ANAEROBIC  AER 11 ML ANA 8 ML   Culture NO GROWTH < 24 HOURS  Final   Report Status PENDING  Incomplete  CULTURE, BLOOD (ROUTINE X 2) w Reflex to ID Panel     Status: None (Preliminary result)   Collection Time: 10/29/16  5:48 PM  Result Value Ref Range Status   Specimen Description BLOOD RIGHT HAND  Final   Special Requests   Final    BOTTLES DRAWN AEROBIC AND ANAEROBIC  AER 9 ML ANA 9 ML   Culture NO GROWTH < 24 HOURS  Final   Report Status PENDING  Incomplete     Coagulation Studies: No results for input(s): LABPROT, INR in the last 72 hours.  Urinalysis:  Recent Labs  10/30/16 1036  COLORURINE YELLOW*  LABSPEC 1.015  PHURINE 5.0  GLUCOSEU NEGATIVE  HGBUR MODERATE*  BILIRUBINUR NEGATIVE  KETONESUR 5*  PROTEINUR 100*  NITRITE NEGATIVE  LEUKOCYTESUR LARGE*        Imaging:  No results found.   Assessment & Plan: Patient is a 76 y.o. male with medical problems of Rectal cancer, status post colectomy, currently with a colostomy bag, insulin-dependent diabetes, who was admitted to [facility] on 10/25/2016   1. Acute renal failure - Differential includes prerenal  leading to ATN, rhabdomyolysis, sepsis - With IV hydration, serum creatinine has improved to 117 -Known prior creatinine is 0.95 from December 2016 Urine output 350 cc last 24 hrs  2. Generalized edema Likely from third spacing Will consider trial of IV Lasix with IV albumin tomorrow morning  3. Emphysematous pyelonephritis and Escherichia coli, group B strep bacteremia Treatment as per urology recommendations  Overall prognosis appears poor. Consider palliative care consult

## 2016-10-30 NOTE — NC FL2 (Signed)
[place] LEVEL OF CARE SCREENING TOOL     IDENTIFICATION  Patient Name: Gary Hampton Birthdate: 1940-12-28 Sex: male Admission Date (Current Location): 10/25/2016  [place] and [place] Number:  [name] [id number] [place] and Address:  [facility], [address], [place], [place] [address]      Provider Number: [id number]  Attending Physician Name and Address:  Gladstone Lighter, MD  Relative Name and Phone Number:  [name],[phone]    Current Level of Care: Hospital Recommended Level of Care: [place] Prior Approval Number:    Date Approved/Denied:   PASRR Number: [id number]  Discharge Plan: SNF    Current Diagnoses: Patient Active Problem List   Diagnosis Date Noted  . Nausea and vomiting   . Anemia   . Gastrointestinal hemorrhage with melena   . Vomiting   . Fall   . Diabetes mellitus ([place])   . Acute renal failure ([place]) 10/25/2016  . A-fib ([place]) 10/25/2016  . Incontinence 04/23/2016  . Diabetes mellitus without complication ([place]) [id number]  . History of colonic polyps 06/23/2015  . Uncontrolled diabetes mellitus ([place]) 03/01/2015  . Rectal cancer ([place]) 02/22/2015    Orientation RESPIRATION BLADDER Height & Weight     Self, Time, Situation, Place  O2 (2L) Continent Weight: 189 lb 12.8 oz (86.1 kg) Height:  6\' 2"  (188 cm)  BEHAVIORAL SYMPTOMS/MOOD NEUROLOGICAL BOWEL NUTRITION STATUS      Continent Diet (Full Liquid )  AMBULATORY STATUS COMMUNICATION OF NEEDS Skin   Limited Assist Verbally Normal                       Personal Care Assistance Level of Assistance  Bathing, Feeding, Dressing Bathing Assistance: Limited assistance Feeding assistance: Independent Dressing Assistance: Limited assistance     Functional Limitations Info  Sight, Hearing, Speech Sight Info: Adequate Hearing Info: Adequate Speech Info: Adequate    SPECIAL CARE FACTORS FREQUENCY  PT (By  licensed PT)     PT Frequency: 5x a week              Contractures Contractures Info: Not present    Additional Factors Info  Insulin Sliding Scale, Allergies, Code Status Code Status Info: DNR Allergies Info: NKA   Insulin Sliding Scale Info: insulin aspart (novoLOG) injection 0-15 Units       Current Medications (10/30/2016):  This is the current hospital active medication list Current Facility-Administered Medications  Medication Dose Route Frequency Provider Last Rate Last Dose  . acetaminophen (TYLENOL) tablet 650 mg  650 mg Oral Q6H PRN Hillary Bow, MD       Or  . acetaminophen (TYLENOL) suppository 650 mg  650 mg Rectal Q6H PRN Srikar Sudini, MD      . albuterol (PROVENTIL) (2.5 MG/3ML) 0.083% nebulizer solution 2.5 mg  2.5 mg Nebulization Q2H PRN Srikar Sudini, MD      . Ampicillin-Sulbactam (UNASYN) 3 g in sodium chloride 0.9 % 100 mL IVPB  3 g Intravenous Q6H Leonel Ramsay, MD   3 g at 10/30/16 1222  . diphenoxylate-atropine (LOMOTIL) 2.5-0.025 MG per tablet 1 tablet  1 tablet Oral QID PRN Srikar Sudini, MD      . feeding supplement (ENSURE ENLIVE) (ENSURE ENLIVE) liquid 237 mL  237 mL Oral BID BM Epifanio Lesches, MD      . heparin injection 5,000 Units  5,000 Units Subcutaneous Q8H Gladstone Lighter, MD      . HYDROcodone-acetaminophen (NORCO/VICODIN) 5-325 MG per tablet 1-2 tablet  1-2 tablet Oral Q4H PRN Srikar Sudini, MD      . insulin aspart (novoLOG) injection 0-15 Units  0-15 Units Subcutaneous TID WC Hillary Bow, MD   2 Units at 10/30/16 1223  . insulin aspart (novoLOG) injection 0-5 Units  0-5 Units Subcutaneous QHS Hillary Bow, MD   3 Units at 10/25/16 2229  . labetalol (NORMODYNE,TRANDATE) injection 10 mg  10 mg Intravenous Q6H PRN Srikar Sudini, MD      . metoprolol (LOPRESSOR) tablet 50 mg  50 mg Oral BID Hillary Bow, MD   50 mg at 10/30/16 1015  . ondansetron (ZOFRAN) tablet 4 mg  4 mg Oral Q6H PRN Hillary Bow, MD       Or  . ondansetron  (ZOFRAN) injection 4 mg  4 mg Intravenous Q6H PRN Srikar Sudini, MD      . oxybutynin (DITROPAN-XL) 24 hr tablet 10 mg  10 mg Oral QHS Hillary Bow, MD   10 mg at 10/29/16 2107  . oxyCODONE (OXYCONTIN) 12 hr tablet 10 mg  10 mg Oral Q12H Srikar Sudini, MD   10 mg at 10/30/16 1015  . pantoprazole (PROTONIX) injection 40 mg  40 mg Intravenous Q12H Wilford Corner, MD   40 mg at 10/30/16 1015  . polyethylene glycol (MIRALAX / GLYCOLAX) packet 17 g  17 g Oral Daily PRN Srikar Sudini, MD      . sodium chloride flush (NS) 0.9 % injection 3 mL  3 mL Intravenous Q12H Srikar Sudini, MD   3 mL at 10/30/16 1016     Discharge Medications: Please see discharge summary for a list of discharge medications.  Relevant Imaging Results:  Relevant Lab Results:   Additional Information SSN [id number]  Ross Ludwig, [place]

## 2016-10-30 NOTE — Clinical Social Work Note (Signed)
Clinical Social Work Assessment  Patient Details  Name: Gary Hampton MRN: [id number] Date of Birth: [birth year]  Date of referral:  10/30/16               Reason for consult:  Facility Placement                Permission sought to share information with:  Facility Sport and exercise psychologist, Family Supports Permission granted to share information::  Yes, Verbal Permission Granted  Name::     [name],Gary Hampton [phone]   Agency::  SNF admissions  Relationship::     Contact Information:     Housing/Transportation Living arrangements for the past 2 months:  [place] of Information:  Patient Patient Interpreter Needed:  None Criminal Activity/Legal Involvement Pertinent to Current Situation/Hospitalization:  No - Comment as needed Significant Relationships:  Friend Lives with:  Self Do you feel safe going back to the place where you live?  No Need for family participation in patient care:  No (Coment)  Care giving concerns:  Patient feels he needs some short term rehab before returning back home.   Social Worker assessment / plan:  Patient is a 76 year old male who lives alone, patient states he has not been to rehab in the past.  Patient expressed that he did not have any family for CSW to speak to discuss SNF placement.  Patient has a friend in the contact list, CSW will attempt to contact patient's friend who is listed as contact.  Patient was not very talkative but was agreeable to having CSW begin bed search process.  Patient has agreed to have CSW look for beds in [facility].  Patient did not have any other questions or concerns.  Employment status:  Retired Forensic scientist:  Medicare PT Recommendations:  [place] / Referral to community resources:  Gary Hampton  Patient/Family's Response to care:  Patient agreed to going to SNF for short term rehab.  Patient/Family's Understanding of and Emotional Response  to Diagnosis, Current Treatment, and Prognosis:  Patient did not express any concerns of his diagnosis or current treatment plan.  Emotional Assessment Appearance:  Appears stated age Attitude/Demeanor/Rapport:    Affect (typically observed):    Orientation:  Oriented to Self, Oriented to Place Alcohol / Substance use:  Not Applicable Psych involvement (Current and /or in the community):  No (Comment)  Discharge Needs  Concerns to be addressed:  Lack of Support Readmission within the last 30 days:  No Current discharge risk:  Lack of support system, Lives alone Barriers to Discharge:  No Barriers Identified   [name] 10/30/2016, 4:49 PM

## 2016-10-30 NOTE — Progress Notes (Signed)
[facility] INFECTIOUS DISEASE PROGRESS NOTE Date of Admission:  10/25/2016     ID: Gary Hampton is a 76 y.o. male with  Bacteremia, pyelonephritis Active Problems:   Acute renal failure (HCC)   A-fib (HCC)   Gastrointestinal hemorrhage with melena   Vomiting   Fall   Diabetes mellitus ([place])   Anemia   Nausea and vomiting   Subjective: No fevers, wbc improving.   ROS  Eleven systems are reviewed and negative except per hpi  Medications:  Antibiotics Given (last 72 hours)    Date/Time Action Medication Dose Rate   10/27/16 1756 Given   meropenem (MERREM) IVPB SOLR 1 g 1 g 100 mL/hr   10/28/16 0513 Given   meropenem (MERREM) IVPB SOLR 1 g 1 g 100 mL/hr   10/28/16 1434 Given   vancomycin (VANCOCIN) IVPB 1000 mg/200 mL premix 1,000 mg 200 mL/hr   10/28/16 1730 Given   meropenem (MERREM) IVPB SOLR 1 g 1 g 100 mL/hr   10/28/16 2046 Given   vancomycin (VANCOCIN) IVPB 1000 mg/200 mL premix 1,000 mg 200 mL/hr   10/29/16 0555 Given   meropenem (MERREM) IVPB SOLR 1 g 1 g 100 mL/hr   10/29/16 1609 Given   Ampicillin-Sulbactam (UNASYN) 3 g in sodium chloride 0.9 % 100 mL IVPB 3 g 200 mL/hr   10/29/16 2300 Given   Ampicillin-Sulbactam (UNASYN) 3 g in sodium chloride 0.9 % 100 mL IVPB 3 g 200 mL/hr   10/30/16 0543 Given   Ampicillin-Sulbactam (UNASYN) 3 g in sodium chloride 0.9 % 100 mL IVPB 3 g 200 mL/hr   10/30/16 1222 Given   Ampicillin-Sulbactam (UNASYN) 3 g in sodium chloride 0.9 % 100 mL IVPB 3 g 200 mL/hr     . ampicillin-sulbactam (UNASYN) IV  3 g Intravenous Q6H  . feeding supplement (ENSURE ENLIVE)  237 mL Oral BID BM  . heparin subcutaneous  5,000 Units Subcutaneous Q8H  . insulin aspart  0-15 Units Subcutaneous TID WC  . insulin aspart  0-5 Units Subcutaneous QHS  . metoprolol tartrate  50 mg Oral BID  . oxybutynin  10 mg Oral QHS  . oxyCODONE  10 mg Oral Q12H  . pantoprazole (PROTONIX) IV  40 mg Intravenous Q12H  . sodium chloride flush  3 mL Intravenous  Q12H    Objective: Vital signs in last 24 hours: Temp:  [97.4 F (36.3 C)-98.4 F (36.9 C)] 98.4 F (36.9 C) (01/03 1131) Pulse Rate:  [40-96] 85 (01/03 1131) Resp:  [16-18] 18 (01/03 1131) BP: (120-126)/(44-56) 125/44 (01/03 1131) SpO2:  [91 %-99 %] 99 % (01/03 1131) Weight:  [86.1 kg (189 lb 12.8 oz)] 86.1 kg (189 lb 12.8 oz) (01/03 0428) Constitutional: He is oriented to person, place, and time. He appears well-developed and well-nourished. Frail HENT: anicteric Mouth/Throat: Oropharynx is clear and dry. No oropharyngeal exudate.  Cardiovascular: Normal rate, regular rhythm and normal heart sounds.   Pulmonary/Chest: Effort normal and breath sounds normal. No respiratory distress. He has no wheezes.  Abdominal: Soft. Bowel sounds are normal. He exhibits no distension. Colostomy site with black stool Lymphadenopathy:  He has no cervical adenopathy.  Neurological: He is alert and oriented to person, place, and time.  Skin: Skin is warm and dry. No rash noted. No erythema.  Psychiatric: He has a normal mood and affect. His behavior is normal.   Lab Results  Recent Labs  10/28/16 0421 10/29/16 0551 10/30/16 0711  WBC 5.9  --   --   HGB  9.1*  --   --   HCT 26.4*  --   --   NA 137  --  134*  K 3.6  --  3.9  CL 107  --  106  CO2 23  --  21*  BUN 67*  --  57*  CREATININE 2.00* 1.82* 2.17*    Microbiology: Results for orders placed or performed during the hospital encounter of 10/25/16  Urine culture     Status: Abnormal   Collection Time: 10/25/16 12:36 PM  Result Value Ref Range Status   Specimen Description URINE, RANDOM  Final   Special Requests NONE  Final   Culture (A)  Final    >=100,000 COLONIES/mL GROUP B STREP(S.AGALACTIAE)ISOLATED TESTING AGAINST S. AGALACTIAE NOT ROUTINELY PERFORMED DUE TO PREDICTABILITY OF AMP/PEN/VAN SUSCEPTIBILITY. Performed at [facility]    Report Status 10/26/2016 FINAL  Final  Blood Culture (routine x 2)     Status:  Abnormal   Collection Time: 10/25/16 12:54 PM  Result Value Ref Range Status   Specimen Description BLOOD RIGHT ARM  Final   Special Requests   Final    BOTTLES DRAWN AEROBIC AND ANAEROBIC ANA14ML AER13ML   Culture  Setup Time   Final    GRAM POSITIVE COCCI GRAM NEGATIVE RODS IN BOTH AEROBIC AND ANAEROBIC BOTTLES CONFIRMED BY RWW CRITICAL RESULT CALLED TO, READ BACK BY AND VERIFIED WITH: MATT Hall AT [place] ON 10/26/16 RWW Performed at [facility]    Culture (A)  Final    ESCHERICHIA COLI GROUP B STREP(S.AGALACTIAE)ISOLATED    Report Status 10/29/2016 FINAL  Final   Organism ID, Bacteria ESCHERICHIA COLI  Final   Organism ID, Bacteria GROUP B STREP(S.AGALACTIAE)ISOLATED  Final      Susceptibility   Escherichia coli - MIC*    AMPICILLIN <=2 SENSITIVE Sensitive     CEFAZOLIN <=4 SENSITIVE Sensitive     CEFEPIME <=1 SENSITIVE Sensitive     CEFTAZIDIME <=1 SENSITIVE Sensitive     CEFTRIAXONE <=1 SENSITIVE Sensitive     CIPROFLOXACIN <=0.25 SENSITIVE Sensitive     GENTAMICIN <=1 SENSITIVE Sensitive     IMIPENEM <=0.25 SENSITIVE Sensitive     TRIMETH/SULFA <=20 SENSITIVE Sensitive     AMPICILLIN/SULBACTAM <=2 SENSITIVE Sensitive     PIP/TAZO <=4 SENSITIVE Sensitive     Extended ESBL NEGATIVE Sensitive     * ESCHERICHIA COLI   Group b strep(s.agalactiae)isolated - MIC*    CLINDAMYCIN <=0.25 SENSITIVE Sensitive     AMPICILLIN <=0.25 SENSITIVE Sensitive     ERYTHROMYCIN <=0.12 SENSITIVE Sensitive     VANCOMYCIN 0.5 SENSITIVE Sensitive     CEFTRIAXONE <=0.12 SENSITIVE Sensitive     LEVOFLOXACIN 1 SENSITIVE Sensitive     * GROUP B STREP(S.AGALACTIAE)ISOLATED  Blood Culture (routine x 2)     Status: Abnormal   Collection Time: 10/25/16 12:54 PM  Result Value Ref Range Status   Specimen Description BLOOD LEFT ARM  Final   Special Requests BOTTLES DRAWN AEROBIC AND ANAEROBIC AER7ML ANA8ML  Final   Culture  Setup Time   Final    GRAM NEGATIVE RODS GRAM POSITIVE COCCI IN  BOTH AEROBIC AND ANAEROBIC BOTTLES CRITICAL VALUE NOTED.  VALUE IS CONSISTENT WITH PREVIOUSLY REPORTED AND CALLED VALUE. CONFIRMED BY [facility]    Culture (A)  Final    ESCHERICHIA COLI GROUP B STREP(S.AGALACTIAE)ISOLATED SUSCEPTIBILITIES PERFORMED ON PREVIOUS CULTURE WITHIN THE LAST 5 DAYS. Performed at [facility]    Report Status 10/29/2016 FINAL  Final  Blood Culture  ID Panel (Reflexed)     Status: Abnormal   Collection Time: 10/25/16 12:54 PM  Result Value Ref Range Status   Enterococcus species NOT DETECTED NOT DETECTED Final   Listeria monocytogenes NOT DETECTED NOT DETECTED Final   Staphylococcus species NOT DETECTED NOT DETECTED Final   Staphylococcus aureus NOT DETECTED NOT DETECTED Final   Streptococcus species DETECTED (A) NOT DETECTED Final    Comment: CRITICAL RESULT CALLED TO, READ BACK BY AND VERIFIED WITH: MATT MCBANE AT 0159 ON 10/26/16 RWW    Streptococcus agalactiae DETECTED (A) NOT DETECTED Final    Comment: CRITICAL RESULT CALLED TO, READ BACK BY AND VERIFIED WITH: MATT MCBANE AT 0159 ON 10/26/16 RWW    Streptococcus pneumoniae NOT DETECTED NOT DETECTED Final   Streptococcus pyogenes NOT DETECTED NOT DETECTED Final   Acinetobacter baumannii NOT DETECTED NOT DETECTED Final   Enterobacteriaceae species DETECTED (A) NOT DETECTED Final    Comment: CRITICAL RESULT CALLED TO, READ BACK BY AND VERIFIED WITH: MATT MCBANE AT 0159 ON 10/26/16 RWW    Enterobacter cloacae complex NOT DETECTED NOT DETECTED Final   Escherichia coli DETECTED (A) NOT DETECTED Final    Comment: CRITICAL RESULT CALLED TO, READ BACK BY AND VERIFIED WITH: MATT MCBANE AT 0159 ON 10/26/16 RWW    Klebsiella oxytoca NOT DETECTED NOT DETECTED Final   Klebsiella pneumoniae NOT DETECTED NOT DETECTED Final   Proteus species NOT DETECTED NOT DETECTED Final   Serratia marcescens NOT DETECTED NOT DETECTED Final   Carbapenem resistance NOT DETECTED NOT DETECTED Final   Haemophilus influenzae NOT  DETECTED NOT DETECTED Final   Neisseria meningitidis NOT DETECTED NOT DETECTED Final   Pseudomonas aeruginosa NOT DETECTED NOT DETECTED Final   Candida albicans NOT DETECTED NOT DETECTED Final   Candida glabrata NOT DETECTED NOT DETECTED Final   Candida krusei NOT DETECTED NOT DETECTED Final   Candida parapsilosis NOT DETECTED NOT DETECTED Final   Candida tropicalis NOT DETECTED NOT DETECTED Final  CULTURE, BLOOD (ROUTINE X 2) w Reflex to ID Panel     Status: None (Preliminary result)   Collection Time: 10/29/16  5:41 PM  Result Value Ref Range Status   Specimen Description BLOOD  LR[facility]  Final   Special Requests   Final    BOTTLES DRAWN AEROBIC AND ANAEROBIC  AER 11 ML ANA 8 ML   Culture NO GROWTH < 24 HOURS  Final   Report Status PENDING  Incomplete  CULTURE, BLOOD (ROUTINE X 2) w Reflex to ID Panel     Status: None (Preliminary result)   Collection Time: 10/29/16  5:48 PM  Result Value Ref Range Status   Specimen Description BLOOD RIGHT HAND  Final   Special Requests   Final    BOTTLES DRAWN AEROBIC AND ANAEROBIC  AER 9 ML ANA 9 ML   Culture NO GROWTH < 24 HOURS  Final   Report Status PENDING  Incomplete    Studies/Results: No results found.  Assessment/Plan: Steward Cost is a 76 y.o. male with GB strep and E coli bacteremia likely from urinary source. He has been seen by urology and had a foley placed due to urinary retention.  CT shows pyelo vs renal abscess. Fu CT seems to show progression of pyelo but clinically improving. I have reviewed imaging with radiology and seems to be a borderline change in the amt of air on R side 1/3 no fevers, bcx 1/2 negative Recommendations Continue unasyn which will cover the e coli, GB strep and possible  anaerobes Not a great surgical candidate.  If he worsens would consider perc nephrostomy but not a great deal of pus visibile on imaging and not obstructed Likely will need several weeks of abx - would cont IV until stable then can dc on  oral augmentin   Thank you very much for the consult. Will follow with you.  Tachina Spoonemore P   10/30/2016, 1:12 PM

## 2016-10-30 NOTE — Clinical Social Work Placement (Signed)
   CLINICAL SOCIAL WORK PLACEMENT  NOTE  Date:  10/30/2016  Patient Details  Name: Gary Hampton MRN: [id number] Date of Birth: 1941/03/18  Clinical Social Work is seeking post-discharge placement for this patient at the [place] level of care (*CSW will initial, date and re-position this form in  chart as items are completed):  Yes   Patient/family provided with [facility] Work Department's list of facilities offering this level of care within the geographic area requested by the patient (or if unable, by the patient's family).  Yes   Patient/family informed of their freedom to choose among providers that offer the needed level of care, that participate in Medicare, Medicaid or managed care program needed by the patient, have an available bed and are willing to accept the patient.  Yes   Patient/family informed of [facility]'s ownership interest in [facility] and [facility], as well as of the fact that they are under no obligation to receive care at these facilities.  PASRR submitted to EDS on 10/30/16     PASRR number received on       Existing PASRR number confirmed on 10/30/16     FL2 transmitted to all facilities in geographic area requested by pt/family on 10/30/16     FL2 transmitted to all facilities within larger geographic area on       Patient informed that his/her managed care company has contracts with or will negotiate with certain facilities, including the following:            Patient/family informed of bed offers received.  Patient chooses bed at       Physician recommends and patient chooses bed at      Patient to be transferred to   on  .  Patient to be transferred to facility by       Patient family notified on   of transfer.  Name of family member notified:        PHYSICIAN Please sign FL2, Please sign DNR     Additional Comment:    _______________________________________________ Ross Ludwig,  LCSWA 10/30/2016, 4:47 PM

## 2016-10-30 NOTE — Progress Notes (Signed)
[facility] at [place] NAME: Gary Hampton    MR#:  [id number]  DATE OF BIRTH:  06/25/1941  SUBJECTIVE:  CHIEF COMPLAINT:   Chief Complaint  Patient presents with  . Emesis  . Fall  . Hyperglycemia   -Renal function about the same. No fevers. - Appears about the same. Denies any complaints.  REVIEW OF SYSTEMS:  Review of Systems  Constitutional: Positive for malaise/fatigue. Negative for chills and fever.  HENT: Negative for ear discharge, ear pain, hearing loss and nosebleeds.   Eyes: Negative for blurred vision and double vision.  Respiratory: Negative for cough, shortness of breath and wheezing.   Cardiovascular: Negative for chest pain, palpitations and leg swelling.  Gastrointestinal: Negative for abdominal pain, constipation, diarrhea, nausea and vomiting.  Genitourinary: Negative for dysuria.  Musculoskeletal: Negative for myalgias.  Neurological: Negative for dizziness, speech change, focal weakness, seizures and headaches.  Psychiatric/Behavioral: Negative for depression.    DRUG ALLERGIES:  No Known Allergies  VITALS:  Blood pressure (!) 125/44, pulse 85, temperature 98.4 F (36.9 C), resp. rate 18, height 6\' 2"  (1.88 m), weight 86.1 kg (189 lb 12.8 oz), SpO2 99 %.  PHYSICAL EXAMINATION:  Physical Exam  GENERAL:  76 y.o.-year-old patient lying in the bed with no acute distress. Slight tachypnea on exertion. EYES: Pupils equal, round, reactive to light and accommodation. No scleral icterus. Extraocular muscles intact.  HEENT: Head atraumatic, normocephalic. Oropharynx and nasopharynx clear.  NECK:  Supple, no jugular venous distention. No thyroid enlargement, no tenderness.  LUNGS: Normal breath sounds bilaterally, no wheezing, rales,rhonchi or crepitation. No use of accessory muscles of respiration. Decreased bibasilar breath sounds. CARDIOVASCULAR: S1, S2 normal. No murmurs, rubs, or gallops.  ABDOMEN: Soft,  nontender, nondistended. Bowel sounds present. No organomegaly or mass. Colostomy bag in place with no stool in it EXTREMITIES: No pedal edema, cyanosis, or clubbing. Right arm is more swollen than the left. 1+ left foot edema. NEUROLOGIC: Cranial nerves II through XII are intact. Muscle strength 5/5 in all extremities. Sensation intact. Gait not checked.  PSYCHIATRIC: The patient is alert and oriented x 3. Slightly slow to respond SKIN: No obvious rash, lesion, or ulcer.    LABORATORY PANEL:   CBC  Recent Labs Lab 10/28/16 0421  WBC 5.9  HGB 9.1*  HCT 26.4*  PLT 100*   ------------------------------------------------------------------------------------------------------------------  Chemistries   Recent Labs Lab 10/25/16 1914 10/26/16 0310  10/30/16 0711  NA  --  133*  < > 134*  K  --  4.1  < > 3.9  CL  --  99*  < > 106  CO2  --  25  < > 21*  GLUCOSE  --  215*  < > 131*  BUN  --  86*  < > 57*  CREATININE  --  2.68*  < > 2.17*  CALCIUM  --  7.1*  < > 7.4*  MG 1.4*  --   --   --   AST  --  37  --   --   ALT  --  18  --   --   ALKPHOS  --  53  --   --   BILITOT  --  0.6  --   --   < > = values in this interval not displayed. ------------------------------------------------------------------------------------------------------------------  Cardiac Enzymes  Recent Labs Lab 10/25/16 1215  TROPONINI 0.05*   ------------------------------------------------------------------------------------------------------------------  RADIOLOGY:  No results found.  EKG:   Orders placed  or performed during the hospital encounter of 10/25/16  . EKG 12-Lead  . EKG 12-Lead  . EKG 12-Lead  . EKG 12-Lead  . EKG 12-Lead  . EKG 12-Lead    ASSESSMENT AND PLAN:   76 year old male with past medical history significant for rectal cancer status post colostomy bag, diabetes, hypertension presents to the hospital secondary to fall and noted to have sepsis.  #1 sepsis-secondary to  Escherichia coli and group B strep bacteremia -Likely source urine. Has bilateral pyelonephritis. -CT of the abdomen showing also has increased ascites and worsening right-sided pyelonephritis. -Clinically improving. Appreciate infectious disease consult. -Currently on Unasyn.  #2 acute renal failure-normal creatinine last December. Admitted with acute pyelonephritis and sepsis. -Improved with IV fluids but has been stable around 2. -No obstruction noted. Appears volume overloaded with ascites and peripheral edema. -Might benefit from Lasix. However will get nephrology on board -Avoid nephrotoxins  #3 rectal cancer-status post combined right hemicolectomy and perineal resection for rectal cancer almost more than a year ago. -Recent colonoscopy done as an outpatient. -No acute issues. Discontinue loperamide as he's been constipated. Continue when necessary Lomotil for loose stools.  #4 melena-on admission. Has been stable. Continue PPI. -Appreciate GI consult. No further interventions at this time. -Advanced diet  #5 A. fib with RVR-was followed by cardiology initially. Echo with normal EF. -Secondary to sepsis. Improved now.  #6 DVT prophylaxis- since hb stable, start SQ heparin  Physical Therapy consulted- recommended SNF   All the records are reviewed and case discussed with Care Management/Social Workerr. Management plans discussed with the patient, family and they are in agreement.  CODE STATUS: DO NOT RESUSCITATE  TOTAL TIME TAKING CARE OF THIS PATIENT: 39 minutes.   POSSIBLE D/C IN 3-4 DAYS, DEPENDING ON CLINICAL CONDITION.   Gladstone Lighter M.D on 10/30/2016 at 12:16 PM  Between 7am to 6pm - Pager - [phone]  After 6pm go to [email or web address] - password [email or web address]  Office  [phone]  CC: Primary care physician; Albina Billet, MD

## 2016-10-30 NOTE — Progress Notes (Signed)
Dr. Tressia Miners notified of wound on sacrum/buttocks earlier today. Difficult to describe, unable to tell how deep. Top layer of skin is black/purple, with open area to pink skin at one edge. Patient stated he could not feel me touching it. Moderate yellow drainage on pad when patient rolled over, possibly from rectum (H/x cancer) rather than wound. Gauze and foam applied. Order for wound nurse consult.

## 2016-10-31 ENCOUNTER — Inpatient Hospital Stay: Payer: Medicare Other

## 2016-10-31 DIAGNOSIS — L8990 Pressure ulcer of unspecified site, unspecified stage: Secondary | ICD-10-CM | POA: Insufficient documentation

## 2016-10-31 LAB — BASIC METABOLIC PANEL
Anion gap: 9 (ref 5–15)
BUN: 67 mg/dL — AB (ref 6–20)
CO2: 20 mmol/L — ABNORMAL LOW (ref 22–32)
CREATININE: 3.14 mg/dL — AB (ref 0.61–1.24)
Calcium: 7.6 mg/dL — ABNORMAL LOW (ref 8.9–10.3)
Chloride: 107 mmol/L (ref 101–111)
GFR calc Af Amer: 21 mL/min — ABNORMAL LOW (ref >60)
GFR calc non Af Amer: 18 mL/min — ABNORMAL LOW (ref >60)
GLUCOSE: 172 mg/dL — AB (ref 65–99)
POTASSIUM: 4.1 mmol/L (ref 3.5–5.1)
SODIUM: 136 mmol/L (ref 135–145)

## 2016-10-31 LAB — GLUCOSE, CAPILLARY
GLUCOSE-CAPILLARY: 135 mg/dL — AB (ref 65–99)
Glucose-Capillary: 218 mg/dL — ABNORMAL HIGH (ref 65–99)
Glucose-Capillary: 182 mg/dL — ABNORMAL HIGH (ref 65–99)
Glucose-Capillary: 142 mg/dL — ABNORMAL HIGH (ref 65–99)

## 2016-10-31 LAB — CBC
HCT: 26.8 % — ABNORMAL LOW (ref 40.0–52.0)
Hemoglobin: 9.1 g/dL — ABNORMAL LOW (ref 13.0–18.0)
MCH: 31.3 pg (ref 26.0–34.0)
MCHC: 33.8 g/dL (ref 32.0–36.0)
MCV: 92.6 fL (ref 80.0–100.0)
PLATELETS: 170 10*3/uL (ref 150–440)
RBC: 2.9 MIL/uL — AB (ref 4.40–5.90)
RDW: 16 % — AB (ref 11.5–14.5)
WBC: 10.7 10*3/uL — ABNORMAL HIGH (ref 3.8–10.6)

## 2016-10-31 MED ORDER — SODIUM CHLORIDE 0.9 % IV SOLN
3.0000 g | Freq: Two times a day (BID) | INTRAVENOUS | Status: DC
  Administered 2016-10-31 – 2016-11-01 (×2): 3 g via INTRAVENOUS
  Filled 2016-10-31 (×3): qty 3

## 2016-10-31 MED ORDER — SODIUM CHLORIDE 0.45 % IV SOLN
INTRAVENOUS | Status: DC
  Administered 2016-10-31: 13:00:00 via INTRAVENOUS

## 2016-10-31 MED ORDER — ZOLPIDEM TARTRATE 5 MG PO TABS
5.0000 mg | ORAL_TABLET | Freq: Once | ORAL | Status: AC
  Administered 2016-10-31: 22:00:00 5 mg via ORAL
  Filled 2016-10-31: qty 1

## 2016-10-31 NOTE — Plan of Care (Signed)
Problem: Education: Goal: Knowledge of [facility] information/materials will improve Outcome: Progressing VSS, free of falls during shift.  Pt oriented to unit.  No stool output from colostomy.  Denies pain.  Foley draining well, cloudy yellow urine.  Pt compliant w/ q2h repositioning for skin integrity.  Bilateral toe wound care done per WOCN order.  PIV flushes well, no issues.  Bed in low position, call bell within reach.  WCTM.

## 2016-10-31 NOTE — Care Management (Addendum)
Barrier- continued acute renal failure issues.  Nephrology consulting. IV antibiotics for sepsis due to UTI . ID is consulting.  For skilled nursing placement.

## 2016-10-31 NOTE — Progress Notes (Signed)
[facility] at [place] NAME: Gary Hampton    MR#:  [id number]  DATE OF BIRTH:  1941/03/16  SUBJECTIVE:  CHIEF COMPLAINT:   Chief Complaint  Patient presents with  . Emesis  . Fall  . Hyperglycemia   -Clinically appears much weak and also confused. -Renal function is worsening, urine output is decreased  REVIEW OF SYSTEMS:  Review of Systems  Unable to perform ROS: Mental status change    DRUG ALLERGIES:  No Known Allergies  VITALS:  Blood pressure 131/68, pulse 91, temperature 97.9 F (36.6 C), temperature source Oral, resp. rate 18, height 6\' 2"  (1.88 m), weight 90 kg (198 lb 6.4 oz), SpO2 96 %.  PHYSICAL EXAMINATION:  Physical Exam  GENERAL:  76 y.o.-year-old patient lying in the bed with no acute distress. Appears confused and clinically worse. EYES: Pupils equal, round, reactive to light and accommodation. No scleral icterus. Extraocular muscles intact.  HEENT: Head atraumatic, normocephalic. Oropharynx and nasopharynx clear.  NECK:  Supple, no jugular venous distention. No thyroid enlargement, no tenderness.  LUNGS: Normal breath sounds bilaterally, no wheezing, rales,rhonchi or crepitation. No use of accessory muscles of respiration. Decreased bibasilar breath sounds. CARDIOVASCULAR: S1, S2 normal. No murmurs, rubs, or gallops.  ABDOMEN: Soft, nontender, nondistended. Bowel sounds present. No organomegaly or mass. Colostomy bag in place with no stool in it EXTREMITIES: Appears erythematous, 2+ bilateral upper extremity edema and 1+ lower extremity edema. NEUROLOGIC: Following some commands today, global weakness noted  PSYCHIATRIC: The patient is alert at confused and very slow to respond. Unable to comprehend. SKIN: No obvious rash, lesion, or ulcer.    LABORATORY PANEL:   CBC  Recent Labs Lab 10/31/16 0428  WBC 10.7*  HGB 9.1*  HCT 26.8*  PLT 170    ------------------------------------------------------------------------------------------------------------------  Chemistries   Recent Labs Lab 10/25/16 1914 10/26/16 0310  10/31/16 0428  NA  --  133*  < > 136  K  --  4.1  < > 4.1  CL  --  99*  < > 107  CO2  --  25  < > 20*  GLUCOSE  --  215*  < > 172*  BUN  --  86*  < > 67*  CREATININE  --  2.68*  < > 3.14*  CALCIUM  --  7.1*  < > 7.6*  MG 1.4*  --   --   --   AST  --  37  --   --   ALT  --  18  --   --   ALKPHOS  --  53  --   --   BILITOT  --  0.6  --   --   < > = values in this interval not displayed. ------------------------------------------------------------------------------------------------------------------  Cardiac Enzymes  Recent Labs Lab 10/25/16 1215  TROPONINI 0.05*   ------------------------------------------------------------------------------------------------------------------  RADIOLOGY:  No results found.  EKG:   Orders placed or performed during the hospital encounter of 10/25/16  . EKG 12-Lead  . EKG 12-Lead  . EKG 12-Lead  . EKG 12-Lead  . EKG 12-Lead  . EKG 12-Lead    ASSESSMENT AND PLAN:   76 year old male with past medical history significant for rectal cancer status post colostomy bag, diabetes, hypertension presents to the hospital secondary to fall and noted to have sepsis.  #1 sepsis-secondary to Escherichia coli and group B strep bacteremia -Likely source urine. Has bilateral pyelonephritis. -CT of the abdomen showing also has increased ascites and  worsening right-sided pyelonephritis. -Appreciate infectious disease consult. -on Unasyn.  #2 acute renal failure-normal creatinine last December. Admitted with acute pyelonephritis and sepsis. -Foley catheter placed for urinary retention. Creatinine is worsening. Also decreased urine output. -Appreciate nephrology input. Trial of Lasix and albumin received. 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 Also not a candidate for nephrostomy tubes percutaneously as there is no hydronephrosis or focal collection of abscess. -Overall prognosis appears extremely poor at this time. -Palliative care consulted..  #3 rectal cancer-status post combined right hemicolectomy and perineal resection for rectal cancer almost more than a year ago. -Recent colonoscopy done as an outpatient. 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 We'll hold off on surgical consult as palliative care consult is pending  #4 melena-on admission. Has been stable. Continue PPI. -Appreciate GI consult. No further interventions at this time. -Advanced diet  #5 A. fib with RVR-was followed by cardiology initially. Echo with normal EF. -Secondary to sepsis.  #6 DVT prophylaxis- since hb stable, on SQ heparin   Overall prognosis appears extremely poor at this time. Palliative care consulted. Recommend hospice.   All the records are reviewed and case discussed with Care Management/Social Workerr. Management plans discussed with the patient, family and they are in agreement.  CODE STATUS: DO NOT RESUSCITATE  TOTAL TIME TAKING CARE OF THIS PATIENT: 38 minutes.     Gladstone Lighter M.D on 10/31/2016 at 2:25 PM  Between 7am to 6pm - Pager - [phone]  After 6pm go to [email or web address] - password [email or web address]  Office  [phone]  CC: Primary care physician; Albina Billet, MD

## 2016-10-31 NOTE — Clinical Social Work Note (Addendum)
CSW spoke to patient regarding bed offers, patient did not answer, CSW spoke to patient's friend [name] who is on the contact list [phone], he said patient was at [facility] in the past.  [facility] contacted [facility] to see if they can review patient's information.  Patient's friend said if CSW needs anything Toula Moos can be contacted.  CSW to continue to follow patient's progress throughout discharge planning.  1:30pm  CSW was informed by [facility] that they can not accept patient.  CSW to continue to follow patient's progress, palliative care has been consulted as well.  Jones Broom. Waylen Depaolo, MSW, [place]  Mon-Fri 8a-4:30p 10/31/2016 10:38 AM

## 2016-10-31 NOTE — Progress Notes (Signed)
PT Cancellation Note  Patient Details Name: Gary Hampton MRN: [id number] DOB: 1941/03/20   Cancelled Treatment:    Reason Eval/Treat Not Completed: Other (comment) (Palliative at bedside, pt not available for PT).  Will continue to follow acutely.   Collie Siad PT, DPT 10/31/2016, 2:54 PM

## 2016-10-31 NOTE — Consult Note (Signed)
[facility] Nurse wound consult note Reason for Consult:Deep tissue injury to buttocks and sacrum.  Full thickness injury (deep tissue injury) present to sacrum.  Measures 8 cm x 5 cm x 0.1 cm.  Also  Has a 0.3 cm diameter tunnel, extends 4.5 cm  with purulent  drainage just above where rectum was.  Wound type:Pressure Draining open area.  Recommend surgical consult and/or CT scan to rule out fistula.  Pressure Ulcer POA: Yes Measurement:Sacrum/buttocks  8 cm x 5 cm x 0.1 cm maroon discoloration.  Deep tissue, full thickness injury.  Wound [id number] tissue with peeling epithelium to sacrum, buttocks Drainage (amount, consistency, odor) Moderate purulent drainage to opening above old site of rectal opening.  Periwound: Intact Dressing procedure/placement/frequency:Cleanse sacral wound with NS and pat gently dry.  Fill in tunnel with alginate for absorption and to protect surrounding skin.  Change daily and PRN soilage.  [facility] team will remain available to patient, medical and nursing teams.  Domenic Moras RN BSN [place] Pager [phone]

## 2016-10-31 NOTE — Progress Notes (Signed)
Pharmacy Antibiotic Note  Gary Hampton is a 76 y.o. male admitted on 10/25/2016 with sepsis.  Pharmacy has been consulted for Unasyn dosing.  Plan: Patient is currently on ampicillin/sulbactam 3 g IV q6h. Patients Scr has increased from 2.17  To 3.14, CrCl is now <61ml/min. Will change Unasyn to 3gm IV every 12 hours. Will continue to monitor renal function and adjust dose as needed.   Per ID, want to continue to cover for anaerobes and will transition patient to Augmentin at discharge for at least 21 days of total therapy.   Height: 6\' 2"  (188 cm) Weight: 198 lb 6.4 oz (90 kg) IBW/kg (Calculated) : 82.2  Temp (24hrs), Avg:97.8 F (36.6 C), Min:97.5 F (36.4 C), Max:98.4 F (36.9 C)   Recent Labs Lab 10/25/16 1215 10/25/16 1236 10/25/16 1512 10/26/16 0310 10/27/16 0447 10/28/16 0421 10/29/16 0551 10/30/16 0711 10/31/16 0428  WBC 11.1*  --   --  12.3* 8.3 5.9  --   --  10.7*  CREATININE 3.46*  --   --  2.68* 2.45* 2.00* 1.82* 2.17* 3.14*  LATICACIDVEN  --  2.8* 1.9  --   --   --   --   --   --     Estimated Creatinine Clearance: 23.3 mL/min (by C-G formula based on SCr of 3.14 mg/dL (H)).    No Known Allergies  Antimicrobials this admission: CTX 12/29 >>12/29 Meropenem 12/30 >> 1/2 Vancomycin 1/1 >> 1/2 Amp/sulbactam 1/2 >>  Microbiology results: 12/29 BCx: E.coli and GBS pansensitive 12/29 UCx: GBS  Thank you for allowing pharmacy to be a part of this patient's care.  Pernell Dupre, PharmD, BCPS Clinical Pharmacist 10/31/2016 8:54 AM

## 2016-10-31 NOTE — Progress Notes (Signed)
Urology consult f/u  10/31/16  Subjective: Creatinine rising. Urine output only 350 over the past 24 hours, nephrology also following. Clinically worse but hemodynamically stable. Large decubitus ulcer, full-thickness. Palliative care consult pending.  Objective: Vital signs in last 24 hours: Temp:  [97.5 F (36.4 C)-97.9 F (36.6 C)] 97.9 F (36.6 C) (01/04 1242) Pulse Rate:  [83-94] 91 (01/04 1242) Resp:  [18-20] 18 (01/04 1242) BP: (105-131)/(51-68) 131/68 (01/04 1242) SpO2:  [95 %-96 %] 96 % (01/04 1242) Weight:  [198 lb 6.4 oz (90 kg)-199 lb 12.8 oz (90.6 kg)] 198 lb 6.4 oz (90 kg) (01/04 0500)  Intake/Output from previous day: 01/03 0701 - 01/04 0700 In: 308 [P.O.:120; IV Piggyback:188] Out: 350 [Urine:350] Intake/Output this shift: No intake/output data recorded.  Physical Exam:  Constitutional: Sleeping this AM on exam. HEENT: [place] AT, moist mucus membranes. Trachea midline, no masses. Respiratory: Normal respiratory effort, no increased work of breathing. GI: Abdomen is soft, non tender, non distended, no abdominal masses.   Colostomy in place. GU: No CVA tenderness.   Skin: Skin changes noted on bilateral toes. Neurologic: Grossly intact, no focal deficits, moving all 4 extremities. Psychiatric: Normal mood and affect.  Lab Results:  Recent Labs  10/31/16 0428  HGB 9.1*  HCT 26.8*   BMET  Recent Labs  10/30/16 0711 10/31/16 0428  NA 134* 136  K 3.9 4.1  CL 106 107  CO2 21* 20*  GLUCOSE 131* 172*  BUN 57* 67*  CREATININE 2.17* 3.14*  CALCIUM 7.4* 7.6*   No results for input(s): LABPT, INR in the last 72 hours. No results for input(s): LABURIN in the last 72 hours. Results for orders placed or performed during the hospital encounter of 10/25/16  Urine culture     Status: Abnormal   Collection Time: 10/25/16 12:36 PM  Result Value Ref Range Status   Specimen Description URINE, RANDOM  Final   Special Requests NONE  Final   Culture (A)  Final     >=100,000 COLONIES/mL GROUP B STREP(S.AGALACTIAE)ISOLATED TESTING AGAINST S. AGALACTIAE NOT ROUTINELY PERFORMED DUE TO PREDICTABILITY OF AMP/PEN/VAN SUSCEPTIBILITY. Performed at [facility]    Report Status 10/26/2016 FINAL  Final  Blood Culture (routine x 2)     Status: Abnormal   Collection Time: 10/25/16 12:54 PM  Result Value Ref Range Status   Specimen Description BLOOD RIGHT ARM  Final   Special Requests   Final    BOTTLES DRAWN AEROBIC AND ANAEROBIC ANA14ML AER13ML   Culture  Setup Time   Final    GRAM POSITIVE COCCI GRAM NEGATIVE RODS IN BOTH AEROBIC AND ANAEROBIC BOTTLES CONFIRMED BY RWW CRITICAL RESULT CALLED TO, READ BACK BY AND VERIFIED WITH: MATT [place] AT [place] ON 10/26/16 RWW Performed at [facility]    Culture (A)  Final    ESCHERICHIA COLI GROUP B STREP(S.AGALACTIAE)ISOLATED    Report Status 10/29/2016 FINAL  Final   Organism ID, Bacteria ESCHERICHIA COLI  Final   Organism ID, Bacteria GROUP B STREP(S.AGALACTIAE)ISOLATED  Final      Susceptibility   Escherichia coli - MIC*    AMPICILLIN <=2 SENSITIVE Sensitive     CEFAZOLIN <=4 SENSITIVE Sensitive     CEFEPIME <=1 SENSITIVE Sensitive     CEFTAZIDIME <=1 SENSITIVE Sensitive     CEFTRIAXONE <=1 SENSITIVE Sensitive     CIPROFLOXACIN <=0.25 SENSITIVE Sensitive     GENTAMICIN <=1 SENSITIVE Sensitive     IMIPENEM <=0.25 SENSITIVE Sensitive     TRIMETH/SULFA <=20 SENSITIVE  Sensitive     AMPICILLIN/SULBACTAM <=2 SENSITIVE Sensitive     PIP/TAZO <=4 SENSITIVE Sensitive     Extended ESBL NEGATIVE Sensitive     * ESCHERICHIA COLI   Group b strep(s.agalactiae)isolated - MIC*    CLINDAMYCIN <=0.25 SENSITIVE Sensitive     AMPICILLIN <=0.25 SENSITIVE Sensitive     ERYTHROMYCIN <=0.12 SENSITIVE Sensitive     VANCOMYCIN 0.5 SENSITIVE Sensitive     CEFTRIAXONE <=0.12 SENSITIVE Sensitive     LEVOFLOXACIN 1 SENSITIVE Sensitive     * GROUP B STREP(S.AGALACTIAE)ISOLATED  Blood Culture (routine x 2)      Status: Abnormal   Collection Time: 10/25/16 12:54 PM  Result Value Ref Range Status   Specimen Description BLOOD LEFT ARM  Final   Special Requests BOTTLES DRAWN AEROBIC AND ANAEROBIC AER7ML ANA8ML  Final   Culture  Setup Time   Final    GRAM NEGATIVE RODS GRAM POSITIVE COCCI IN BOTH AEROBIC AND ANAEROBIC BOTTLES CRITICAL VALUE NOTED.  VALUE IS CONSISTENT WITH PREVIOUSLY REPORTED AND CALLED VALUE. CONFIRMED BY [facility]    Culture (A)  Final    ESCHERICHIA COLI GROUP B STREP(S.AGALACTIAE)ISOLATED SUSCEPTIBILITIES PERFORMED ON PREVIOUS CULTURE WITHIN THE LAST 5 DAYS. Performed at [facility]    Report Status 10/29/2016 FINAL  Final  Blood Culture ID Panel (Reflexed)     Status: Abnormal   Collection Time: 10/25/16 12:54 PM  Result Value Ref Range Status   Enterococcus species NOT DETECTED NOT DETECTED Final   Listeria monocytogenes NOT DETECTED NOT DETECTED Final   Staphylococcus species NOT DETECTED NOT DETECTED Final   Staphylococcus aureus NOT DETECTED NOT DETECTED Final   Streptococcus species DETECTED (A) NOT DETECTED Final    Comment: CRITICAL RESULT CALLED TO, READ BACK BY AND VERIFIED WITH: MATT MCBANE AT 0159 ON 10/26/16 RWW    Streptococcus agalactiae DETECTED (A) NOT DETECTED Final    Comment: CRITICAL RESULT CALLED TO, READ BACK BY AND VERIFIED WITH: MATT MCBANE AT 0159 ON 10/26/16 RWW    Streptococcus pneumoniae NOT DETECTED NOT DETECTED Final   Streptococcus pyogenes NOT DETECTED NOT DETECTED Final   Acinetobacter baumannii NOT DETECTED NOT DETECTED Final   Enterobacteriaceae species DETECTED (A) NOT DETECTED Final    Comment: CRITICAL RESULT CALLED TO, READ BACK BY AND VERIFIED WITH: MATT MCBANE AT 0159 ON 10/26/16 RWW    Enterobacter cloacae complex NOT DETECTED NOT DETECTED Final   Escherichia coli DETECTED (A) NOT DETECTED Final    Comment: CRITICAL RESULT CALLED TO, READ BACK BY AND VERIFIED WITH: MATT MCBANE AT 0159 ON 10/26/16 RWW    Klebsiella  oxytoca NOT DETECTED NOT DETECTED Final   Klebsiella pneumoniae NOT DETECTED NOT DETECTED Final   Proteus species NOT DETECTED NOT DETECTED Final   Serratia marcescens NOT DETECTED NOT DETECTED Final   Carbapenem resistance NOT DETECTED NOT DETECTED Final   Haemophilus influenzae NOT DETECTED NOT DETECTED Final   Neisseria meningitidis NOT DETECTED NOT DETECTED Final   Pseudomonas aeruginosa NOT DETECTED NOT DETECTED Final   Candida albicans NOT DETECTED NOT DETECTED Final   Candida glabrata NOT DETECTED NOT DETECTED Final   Candida krusei NOT DETECTED NOT DETECTED Final   Candida parapsilosis NOT DETECTED NOT DETECTED Final   Candida tropicalis NOT DETECTED NOT DETECTED Final  CULTURE, BLOOD (ROUTINE X 2) w Reflex to ID Panel     Status: None (Preliminary result)   Collection Time: 10/29/16  5:41 PM  Result Value Ref Range Status   Specimen Description BLOOD  LRFT AC  Final   Special Requests   Final    BOTTLES DRAWN AEROBIC AND ANAEROBIC  AER 11 ML ANA 8 ML   Culture NO GROWTH 2 DAYS  Final   Report Status PENDING  Incomplete  CULTURE, BLOOD (ROUTINE X 2) w Reflex to ID Panel     Status: None (Preliminary result)   Collection Time: 10/29/16  5:48 PM  Result Value Ref Range Status   Specimen Description BLOOD RIGHT HAND  Final   Special Requests   Final    BOTTLES DRAWN AEROBIC AND ANAEROBIC  AER 9 ML ANA 9 ML   Culture NO GROWTH 2 DAYS  Final   Report Status PENDING  Incomplete    Studies/Results: No results found.  Assessment/Plan: 76 yo with bacteremia, emphysematous pyelonephritis, bladder outlet obstruction.  1: Emphysematous pyelonephritis with bacteremia (E. Coli/ GBS)- Repeat blood cultures negative. Currently on Unasyn.   Worsening Cr, low UOP.  Findings reviewed with interventional radiology today, Aletta Edouard. He does not feel like he benefit from PCN in absence of hydronephrosis or obvious collection.   Renal ultrasound ordered today to assess for possible new  obstruction or collection.  Nephrectomy previously discussed, given that the disease is bilateral, would render the patient essentially anephric. Patient refuses intervention which is reasonable.  2. Acute renal failure- worsening  RUS to r/o obstruction Maintain Foley   We will continue to follow closely    LOS: 6 days   Hollice Espy 10/31/2016, 2:13 PM

## 2016-10-31 NOTE — Progress Notes (Signed)
S: Patient appears to be clinically worse. Serum creatinine has increased to 3.1 for today. Urine output remains low at 350 cc. Patient denies any shortness of breath   Medications: Current medications: Current Facility-Administered Medications  Medication Dose Route Frequency Provider Last Rate Last Dose  . acetaminophen (TYLENOL) tablet 650 mg  650 mg Oral Q6H PRN Hillary Bow, MD       Or  . acetaminophen (TYLENOL) suppository 650 mg  650 mg Rectal Q6H PRN Srikar Sudini, MD      . albuterol (PROVENTIL) (2.5 MG/3ML) 0.083% nebulizer solution 2.5 mg  2.5 mg Nebulization Q2H PRN Srikar Sudini, MD      . Ampicillin-Sulbactam (UNASYN) 3 g in sodium chloride 0.9 % 100 mL IVPB  3 g Intravenous Q12H Sheema M Hallaji, RPH      . diphenoxylate-atropine (LOMOTIL) 2.5-0.025 MG per tablet 1 tablet  1 tablet Oral QID PRN Srikar Sudini, MD      . feeding supplement (ENSURE ENLIVE) (ENSURE ENLIVE) liquid 237 mL  237 mL Oral BID BM Epifanio Lesches, MD   237 mL at 10/31/16 1212  . heparin injection 5,000 Units  5,000 Units Subcutaneous Q8H Gladstone Lighter, MD   5,000 Units at 10/31/16 1212  . HYDROcodone-acetaminophen (NORCO/VICODIN) 5-325 MG per tablet 1-2 tablet  1-2 tablet Oral Q4H PRN Hillary Bow, MD   2 tablet at 10/31/16 1210  . insulin aspart (novoLOG) injection 0-15 Units  0-15 Units Subcutaneous TID WC Hillary Bow, MD   3 Units at 10/31/16 1213  . insulin aspart (novoLOG) injection 0-5 Units  0-5 Units Subcutaneous QHS Hillary Bow, MD   3 Units at 10/25/16 2229  . labetalol (NORMODYNE,TRANDATE) injection 10 mg  10 mg Intravenous Q6H PRN Srikar Sudini, MD      . metoprolol (LOPRESSOR) tablet 50 mg  50 mg Oral BID Hillary Bow, MD   50 mg at 10/31/16 0852  . ondansetron (ZOFRAN) tablet 4 mg  4 mg Oral Q6H PRN Hillary Bow, MD       Or  . ondansetron (ZOFRAN) injection 4 mg  4 mg Intravenous Q6H PRN Srikar Sudini, MD      . oxybutynin (DITROPAN-XL) 24 hr tablet 10 mg  10 mg Oral QHS  Hillary Bow, MD   10 mg at 10/30/16 2114  . oxyCODONE (OXYCONTIN) 12 hr tablet 10 mg  10 mg Oral Q12H Hillary Bow, MD   10 mg at 10/31/16 0851  . pantoprazole (PROTONIX) EC tablet 40 mg  40 mg Oral BID Gladstone Lighter, MD   40 mg at 10/31/16 0852  . polyethylene glycol (MIRALAX / GLYCOLAX) packet 17 g  17 g Oral Daily PRN Srikar Sudini, MD      . sodium chloride flush (NS) 0.9 % injection 3 mL  3 mL Intravenous Q12H Srikar Sudini, MD   3 mL at 10/31/16 0853      Allergies: No Known Allergies      Vital Signs: Blood pressure 122/61, pulse 94, temperature 97.8 F (36.6 C), temperature source Oral, resp. rate 20, height 6\' 2"  (1.88 m), weight 90 kg (198 lb 6.4 oz), SpO2 95 %.   Intake/Output Summary (Last 24 hours) at 10/31/16 1240 Last data filed at 10/31/16 0600  Gross per 24 hour  Intake              188 ml  Output              350 ml  Net             -  162 ml    Weight trends: Filed Weights   10/30/16 0428 10/30/16 2341 10/31/16 0500  Weight: 86.1 kg (189 lb 12.8 oz) 90.6 kg (199 lb 12.8 oz) 90 kg (198 lb 6.4 oz)    Physical Exam: General:  Chronically ill-appearing gentleman, laying in the bed  HEENT Anicteric, moist oral mucous membranes, decreased hearing  Neck:  Supple   Lungs: normal breathing effort, mild basilar crackles  Heart::  No rub or gallop, irregular  Abdomen: Colostomy in place, soft,    Extremities:  Generalized edema, feet in soft boot support, ischemic toes noted  Neurologic: Alert, able to answer a few questions  Skin: Scattered bruises     Foley: Present       Lab results: Basic Metabolic Panel:  Recent Labs Lab 10/25/16 1914  10/28/16 0421 10/29/16 0551 10/30/16 0711 10/31/16 0428  NA  --   < > 137  --  134* 136  K  --   < > 3.6  --  3.9 4.1  CL  --   < > 107  --  106 107  CO2  --   < > 23  --  21* 20*  GLUCOSE  --   < > 114*  --  131* 172*  BUN  --   < > 67*  --  57* 67*  CREATININE  --   < > 2.00* 1.82* 2.17* 3.14*   CALCIUM  --   < > 7.3*  --  7.4* 7.6*  MG 1.4*  --   --   --   --   --   < > = values in this interval not displayed.  Liver Function Tests:  Recent Labs Lab 10/26/16 0310  AST 37  ALT 18  ALKPHOS 53  BILITOT 0.6  PROT 5.0*  ALBUMIN 2.3*    Recent Labs Lab 10/25/16 1215  LIPASE <10*   No results for input(s): AMMONIA in the last 168 hours.  CBC:  Recent Labs Lab 10/25/16 1215  10/28/16 0421 10/31/16 0428  WBC 11.1*  < > 5.9 10.7*  NEUTROABS 10.4*  --   --   --   HGB 11.9*  < > 9.1* 9.1*  HCT 34.7*  < > 26.4* 26.8*  MCV 92.1  < > 91.3 92.6  PLT 160  < > 100* 170  < > = values in this interval not displayed.  Cardiac Enzymes:  Recent Labs Lab 10/25/16 1215  CKTOTAL 654*  TROPONINI 0.05*    BNP: Invalid input(s): POCBNP  CBG:  Recent Labs Lab 10/30/16 1127 10/30/16 1615 10/30/16 2136 10/31/16 0719 10/31/16 1140  GLUCAP 137* 143* 130* 218* 182*    Microbiology: Recent Results (from the past 720 hour(s))  Urine culture     Status: Abnormal   Collection Time: 10/25/16 12:36 PM  Result Value Ref Range Status   Specimen Description URINE, RANDOM  Final   Special Requests NONE  Final   Culture (A)  Final    >=100,000 COLONIES/mL GROUP B STREP(S.AGALACTIAE)ISOLATED TESTING AGAINST S. AGALACTIAE NOT ROUTINELY PERFORMED DUE TO PREDICTABILITY OF AMP/PEN/VAN SUSCEPTIBILITY. Performed at [facility]    Report Status 10/26/2016 FINAL  Final  Blood Culture (routine x 2)     Status: Abnormal   Collection Time: 10/25/16 12:54 PM  Result Value Ref Range Status   Specimen Description BLOOD RIGHT ARM  Final   Special Requests   Final    BOTTLES DRAWN AEROBIC AND ANAEROBIC ANA14ML AER13ML   Culture  Setup Time   Final    GRAM POSITIVE COCCI GRAM NEGATIVE RODS IN BOTH AEROBIC AND ANAEROBIC BOTTLES CONFIRMED BY RWW CRITICAL RESULT CALLED TO, READ BACK BY AND VERIFIED WITH: MATT MCBANE AT [place] ON 10/26/16 RWW Performed at [facility]     Culture (A)  Final    ESCHERICHIA COLI GROUP B STREP(S.AGALACTIAE)ISOLATED    Report Status 10/29/2016 FINAL  Final   Organism ID, Bacteria ESCHERICHIA COLI  Final   Organism ID, Bacteria GROUP B STREP(S.AGALACTIAE)ISOLATED  Final      Susceptibility   Escherichia coli - MIC*    AMPICILLIN <=2 SENSITIVE Sensitive     CEFAZOLIN <=4 SENSITIVE Sensitive     CEFEPIME <=1 SENSITIVE Sensitive     CEFTAZIDIME <=1 SENSITIVE Sensitive     CEFTRIAXONE <=1 SENSITIVE Sensitive     CIPROFLOXACIN <=0.25 SENSITIVE Sensitive     GENTAMICIN <=1 SENSITIVE Sensitive     IMIPENEM <=0.25 SENSITIVE Sensitive     TRIMETH/SULFA <=20 SENSITIVE Sensitive     AMPICILLIN/SULBACTAM <=2 SENSITIVE Sensitive     PIP/TAZO <=4 SENSITIVE Sensitive     Extended ESBL NEGATIVE Sensitive     * ESCHERICHIA COLI   Group b strep(s.agalactiae)isolated - MIC*    CLINDAMYCIN <=0.25 SENSITIVE Sensitive     AMPICILLIN <=0.25 SENSITIVE Sensitive     ERYTHROMYCIN <=0.12 SENSITIVE Sensitive     VANCOMYCIN 0.5 SENSITIVE Sensitive     CEFTRIAXONE <=0.12 SENSITIVE Sensitive     LEVOFLOXACIN 1 SENSITIVE Sensitive     * GROUP B STREP(S.AGALACTIAE)ISOLATED  Blood Culture (routine x 2)     Status: Abnormal   Collection Time: 10/25/16 12:54 PM  Result Value Ref Range Status   Specimen Description BLOOD LEFT ARM  Final   Special Requests BOTTLES DRAWN AEROBIC AND ANAEROBIC AER7ML ANA8ML  Final   Culture  Setup Time   Final    GRAM NEGATIVE RODS GRAM POSITIVE COCCI IN BOTH AEROBIC AND ANAEROBIC BOTTLES CRITICAL VALUE NOTED.  VALUE IS CONSISTENT WITH PREVIOUSLY REPORTED AND CALLED VALUE. CONFIRMED BY [facility]    Culture (A)  Final    ESCHERICHIA COLI GROUP B STREP(S.AGALACTIAE)ISOLATED SUSCEPTIBILITIES PERFORMED ON PREVIOUS CULTURE WITHIN THE LAST 5 DAYS. Performed at [facility]    Report Status 10/29/2016 FINAL  Final  Blood Culture ID Panel (Reflexed)     Status: Abnormal   Collection Time: 10/25/16 12:54 PM  Result  Value Ref Range Status   Enterococcus species NOT DETECTED NOT DETECTED Final   Listeria monocytogenes NOT DETECTED NOT DETECTED Final   Staphylococcus species NOT DETECTED NOT DETECTED Final   Staphylococcus aureus NOT DETECTED NOT DETECTED Final   Streptococcus species DETECTED (A) NOT DETECTED Final    Comment: CRITICAL RESULT CALLED TO, READ BACK BY AND VERIFIED WITH: MATT MCBANE AT 0159 ON 10/26/16 RWW    Streptococcus agalactiae DETECTED (A) NOT DETECTED Final    Comment: CRITICAL RESULT CALLED TO, READ BACK BY AND VERIFIED WITH: MATT MCBANE AT 0159 ON 10/26/16 RWW    Streptococcus pneumoniae NOT DETECTED NOT DETECTED Final   Streptococcus pyogenes NOT DETECTED NOT DETECTED Final   Acinetobacter baumannii NOT DETECTED NOT DETECTED Final   Enterobacteriaceae species DETECTED (A) NOT DETECTED Final    Comment: CRITICAL RESULT CALLED TO, READ BACK BY AND VERIFIED WITH: MATT MCBANE AT 0159 ON 10/26/16 RWW    Enterobacter cloacae complex NOT DETECTED NOT DETECTED Final   Escherichia coli DETECTED (A) NOT DETECTED Final    Comment: CRITICAL RESULT CALLED TO,  READ BACK BY AND VERIFIED WITH: MATT MCBANE AT 0159 ON 10/26/16 RWW    Klebsiella oxytoca NOT DETECTED NOT DETECTED Final   Klebsiella pneumoniae NOT DETECTED NOT DETECTED Final   Proteus species NOT DETECTED NOT DETECTED Final   Serratia marcescens NOT DETECTED NOT DETECTED Final   Carbapenem resistance NOT DETECTED NOT DETECTED Final   Haemophilus influenzae NOT DETECTED NOT DETECTED Final   Neisseria meningitidis NOT DETECTED NOT DETECTED Final   Pseudomonas aeruginosa NOT DETECTED NOT DETECTED Final   Candida albicans NOT DETECTED NOT DETECTED Final   Candida glabrata NOT DETECTED NOT DETECTED Final   Candida krusei NOT DETECTED NOT DETECTED Final   Candida parapsilosis NOT DETECTED NOT DETECTED Final   Candida tropicalis NOT DETECTED NOT DETECTED Final  CULTURE, BLOOD (ROUTINE X 2) w Reflex to ID Panel     Status: None  (Preliminary result)   Collection Time: 10/29/16  5:41 PM  Result Value Ref Range Status   Specimen Description BLOOD  LRFT AC  Final   Special Requests   Final    BOTTLES DRAWN AEROBIC AND ANAEROBIC  AER 11 ML ANA 8 ML   Culture NO GROWTH 2 DAYS  Final   Report Status PENDING  Incomplete  CULTURE, BLOOD (ROUTINE X 2) w Reflex to ID Panel     Status: None (Preliminary result)   Collection Time: 10/29/16  5:48 PM  Result Value Ref Range Status   Specimen Description BLOOD RIGHT HAND  Final   Special Requests   Final    BOTTLES DRAWN AEROBIC AND ANAEROBIC  AER 9 ML ANA 9 ML   Culture NO GROWTH 2 DAYS  Final   Report Status PENDING  Incomplete     Coagulation Studies: No results for input(s): LABPROT, INR in the last 72 hours.  Urinalysis:  Recent Labs  10/30/16 1036  COLORURINE YELLOW*  LABSPEC 1.015  PHURINE 5.0  GLUCOSEU NEGATIVE  HGBUR MODERATE*  BILIRUBINUR NEGATIVE  KETONESUR 5*  PROTEINUR 100*  NITRITE NEGATIVE  LEUKOCYTESUR LARGE*     Imaging: No results found.   Assessment & Plan: Patient is a 76 y.o. male with medical problems of Rectal cancer, status post colectomy, currently with a colostomy bag, insulin-dependent diabetes, who was admitted to [facility] on 10/25/2016   1. Acute renal failure - Differential includes prerenal leading to ATN, rhabdomyolysis, sepsis - Known prior creatinine is 0.95 from December 2016 Urine output 350 cc last 24 hrs - will start gentle iv hydration  - Prognosis appears poor - Consider palliative care consult   2. Generalized edema Likely from third spacing   3. Emphysematous pyelonephritis and Escherichia coli, group B strep bacteremia Treatment as per urology and ID recommendations  4. Significant decubitus ulcers 8x5 cm - work up in progress to look for infection/abscess

## 2016-10-31 NOTE — Progress Notes (Signed)
Patient ID: Gary Hampton, male   DOB: [birth year], 76 y.o.   MRN: [id number]  Called by nursing stat regarding patient expressing suicidal ideation.  Patient calm but restless and reportedly requested "a gun and a bottle of sleeping pills to finish what I started."  He also stated that he wanted to leave the hospital as soon as possible.  He is calm and cooperative and is requesting something for sleep.  When asked directly he states that he does not intend to hurt himself and has never tried to commit suicide in the past.   1:1 safety sitter ordered for suicide precautions.  Psych consult requested.  Ambien for sleep.  Continue to monitor closely.

## 2016-11-01 DIAGNOSIS — Z515 Encounter for palliative care: Secondary | ICD-10-CM

## 2016-11-01 DIAGNOSIS — N12 Tubulo-interstitial nephritis, not specified as acute or chronic: Secondary | ICD-10-CM

## 2016-11-01 DIAGNOSIS — Z7189 Other specified counseling: Secondary | ICD-10-CM

## 2016-11-01 DIAGNOSIS — R45851 Suicidal ideations: Secondary | ICD-10-CM

## 2016-11-01 LAB — CBC
HCT: 25.4 % — ABNORMAL LOW (ref 40.0–52.0)
Hemoglobin: 8.4 g/dL — ABNORMAL LOW (ref 13.0–18.0)
MCH: 31.1 pg (ref 26.0–34.0)
MCHC: 33.2 g/dL (ref 32.0–36.0)
MCV: 93.8 fL (ref 80.0–100.0)
PLATELETS: 195 10*3/uL (ref 150–440)
RBC: 2.71 MIL/uL — AB (ref 4.40–5.90)
RDW: 16.2 % — ABNORMAL HIGH (ref 11.5–14.5)
WBC: 10.8 10*3/uL — AB (ref 3.8–10.6)

## 2016-11-01 LAB — BASIC METABOLIC PANEL
ANION GAP: 10 (ref 5–15)
BUN: 72 mg/dL — ABNORMAL HIGH (ref 6–20)
CO2: 18 mmol/L — ABNORMAL LOW (ref 22–32)
Calcium: 7.6 mg/dL — ABNORMAL LOW (ref 8.9–10.3)
Chloride: 105 mmol/L (ref 101–111)
Creatinine, Ser: 4.6 mg/dL — ABNORMAL HIGH (ref 0.61–1.24)
GFR calc Af Amer: 13 mL/min — ABNORMAL LOW (ref >60)
GFR, EST NON AFRICAN AMERICAN: 11 mL/min — AB (ref >60)
Glucose, Bld: 157 mg/dL — ABNORMAL HIGH (ref 65–99)
POTASSIUM: 3.8 mmol/L (ref 3.5–5.1)
SODIUM: 133 mmol/L — AB (ref 135–145)

## 2016-11-01 LAB — GLUCOSE, CAPILLARY
GLUCOSE-CAPILLARY: 159 mg/dL — AB (ref 65–99)
Glucose-Capillary: 150 mg/dL — ABNORMAL HIGH (ref 65–99)
Glucose-Capillary: 166 mg/dL — ABNORMAL HIGH (ref 65–99)

## 2016-11-01 MED ORDER — LORAZEPAM 2 MG/ML IJ SOLN: 1.0000 mg | INTRAMUSCULAR | Status: DC | PRN

## 2016-11-01 MED ORDER — MORPHINE SULFATE (CONCENTRATE) 10 MG/0.5ML PO SOLN
5.0000 mg | ORAL | Status: DC | PRN
  Administered 2016-11-04: 5 mg via ORAL
  Filled 2016-11-01: qty 1

## 2016-11-01 MED ORDER — ZOLPIDEM TARTRATE 5 MG PO TABS
5.0000 mg | ORAL_TABLET | Freq: Every evening | ORAL | Status: DC | PRN
  Administered 2016-11-03: 5 mg via ORAL
  Filled 2016-11-01: qty 1

## 2016-11-01 NOTE — Progress Notes (Signed)
This NP had a brief conversation with the patient regarding current medical status and severity of failing kidneys. He is not very receptive to the conversation and repeatedly tells me to leave him alone. Explained the importance of him communicating with [place] his wishes regarding medical decisions. He tells me he has no family. He "needs time to think" but is ok with me following up tomorrow, 1/5. Spoke with friend, Toula Moos and he plans to be at the hospital in the morning.   NO CHARGE  Ihor Dow, FNP-C Palliative Medicine Team  Phone: [phone] Fax: [phone]

## 2016-11-01 NOTE — Progress Notes (Signed)
S:  Clinical and lab parameters continue to worsen. Patient states that he knows that he is dying and just wants to go home. Palliative care team is working with him  BUN/Cr have worsened today   Medications: Current medications: Current Facility-Administered Medications  Medication Dose Route Frequency Provider Last Rate Last Dose  . acetaminophen (TYLENOL) tablet 650 mg  650 mg Oral Q6H PRN Hillary Bow, MD       Or  . acetaminophen (TYLENOL) suppository 650 mg  650 mg Rectal Q6H PRN Srikar Sudini, MD      . albuterol (PROVENTIL) (2.5 MG/3ML) 0.083% nebulizer solution 2.5 mg  2.5 mg Nebulization Q2H PRN Srikar Sudini, MD      . diphenoxylate-atropine (LOMOTIL) 2.5-0.025 MG per tablet 1 tablet  1 tablet Oral QID PRN Hillary Bow, MD      . HYDROcodone-acetaminophen (NORCO/VICODIN) 5-325 MG per tablet 1-2 tablet  1-2 tablet Oral Q4H PRN Hillary Bow, MD   2 tablet at 10/31/16 1210  . LORazepam (ATIVAN) injection 1 mg  1 mg Intravenous Q4H PRN Basilio Cairo, NP      . morphine CONCENTRATE 10 MG/0.5ML oral solution 5 mg  5 mg Oral Q1H PRN Basilio Cairo, NP      . ondansetron [facility]) tablet 4 mg  4 mg Oral Q6H PRN Hillary Bow, MD       Or  . ondansetron (ZOFRAN) injection 4 mg  4 mg Intravenous Q6H PRN Srikar Sudini, MD      . oxybutynin (DITROPAN-XL) 24 hr tablet 10 mg  10 mg Oral QHS Hillary Bow, MD   10 mg at 10/31/16 2116  . oxyCODONE (OXYCONTIN) 12 hr tablet 10 mg  10 mg Oral Q12H Hillary Bow, MD   10 mg at 11/01/16 0903  . polyethylene glycol (MIRALAX / GLYCOLAX) packet 17 g  17 g Oral Daily PRN Srikar Sudini, MD      . sodium chloride flush (NS) 0.9 % injection 3 mL  3 mL Intravenous Q12H Srikar Sudini, MD   3 mL at 11/01/16 0904  . zolpidem (AMBIEN) tablet 5 mg  5 mg Oral QHS PRN Basilio Cairo, NP          Allergies: No Known Allergies      Vital Signs: Blood pressure 139/69, pulse 72, temperature 97.7 F (36.5 C), temperature source Oral, resp. rate 18, height  6\' 2"  (1.88 m), weight 90 kg (198 lb 6.4 oz), SpO2 100 %.   Intake/Output Summary (Last 24 hours) at 11/01/16 1423 Last data filed at 11/01/16 0659  Gross per 24 hour  Intake              200 ml  Output              350 ml  Net             -150 ml    Weight trends: Filed Weights   10/30/16 0428 10/30/16 2341 10/31/16 0500  Weight: 86.1 kg (189 lb 12.8 oz) 90.6 kg (199 lb 12.8 oz) 90 kg (198 lb 6.4 oz)    Physical Exam: General:  Chronically ill-appearing gentleman, laying in the bed  HEENT Anicteric, moist oral mucous membranes, decreased hearing  Neck:  Supple   Lungs: normal breathing effort, mild basilar crackles  Heart::  No rub or gallop, irregular  Abdomen: Colostomy in place, soft,    Extremities:  Generalized edema, feet in soft boot support, ischemic toes noted  Neurologic: Alert,  able to answer a few questions  Skin: Scattered bruises     Foley: Present       Lab results: Basic Metabolic Panel:  Recent Labs Lab 10/25/16 1914  10/30/16 0711 10/31/16 0428 11/01/16 0604  NA  --   < > 134* 136 133*  K  --   < > 3.9 4.1 3.8  CL  --   < > 106 107 105  CO2  --   < > 21* 20* 18*  GLUCOSE  --   < > 131* 172* 157*  BUN  --   < > 57* 67* 72*  CREATININE  --   < > 2.17* 3.14* 4.60*  CALCIUM  --   < > 7.4* 7.6* 7.6*  MG 1.4*  --   --   --   --   < > = values in this interval not displayed.  Liver Function Tests:  Recent Labs Lab 10/26/16 0310  AST 37  ALT 18  ALKPHOS 53  BILITOT 0.6  PROT 5.0*  ALBUMIN 2.3*   No results for input(s): LIPASE, AMYLASE in the last 168 hours. No results for input(s): AMMONIA in the last 168 hours.  CBC:  Recent Labs Lab 10/31/16 0428 11/01/16 0604  WBC 10.7* 10.8*  HGB 9.1* 8.4*  HCT 26.8* 25.4*  MCV 92.6 93.8  PLT 170 195    Cardiac Enzymes: No results for input(s): CKTOTAL, TROPONINI in the last 168 hours.  BNP: Invalid input(s): POCBNP  CBG:  Recent Labs Lab 10/31/16 1140 10/31/16 1637  10/31/16 2029 11/01/16 0731 11/01/16 1229  GLUCAP 182* 142* 135* 150* 166*    Microbiology: Recent Results (from the past 720 hour(s))  Urine culture     Status: Abnormal   Collection Time: 10/25/16 12:36 PM  Result Value Ref Range Status   Specimen Description URINE, RANDOM  Final   Special Requests NONE  Final   Culture (A)  Final    >=100,000 COLONIES/mL GROUP B STREP(S.AGALACTIAE)ISOLATED TESTING AGAINST S. AGALACTIAE NOT ROUTINELY PERFORMED DUE TO PREDICTABILITY OF AMP/PEN/VAN SUSCEPTIBILITY. Performed at [facility]    Report Status 10/26/2016 FINAL  Final  Blood Culture (routine x 2)     Status: Abnormal   Collection Time: 10/25/16 12:54 PM  Result Value Ref Range Status   Specimen Description BLOOD RIGHT ARM  Final   Special Requests   Final    BOTTLES DRAWN AEROBIC AND ANAEROBIC ANA14ML AER13ML   Culture  Setup Time   Final    GRAM POSITIVE COCCI GRAM NEGATIVE RODS IN BOTH AEROBIC AND ANAEROBIC BOTTLES CONFIRMED BY RWW CRITICAL RESULT CALLED TO, READ BACK BY AND VERIFIED WITH: MATT [place] AT [place] ON 10/26/16 RWW Performed at [facility]    Culture (A)  Final    ESCHERICHIA COLI GROUP B STREP(S.AGALACTIAE)ISOLATED    Report Status 10/29/2016 FINAL  Final   Organism ID, Bacteria ESCHERICHIA COLI  Final   Organism ID, Bacteria GROUP B STREP(S.AGALACTIAE)ISOLATED  Final      Susceptibility   Escherichia coli - MIC*    AMPICILLIN <=2 SENSITIVE Sensitive     CEFAZOLIN <=4 SENSITIVE Sensitive     CEFEPIME <=1 SENSITIVE Sensitive     CEFTAZIDIME <=1 SENSITIVE Sensitive     CEFTRIAXONE <=1 SENSITIVE Sensitive     CIPROFLOXACIN <=0.25 SENSITIVE Sensitive     GENTAMICIN <=1 SENSITIVE Sensitive     IMIPENEM <=0.25 SENSITIVE Sensitive     TRIMETH/SULFA <=20 SENSITIVE Sensitive     AMPICILLIN/SULBACTAM <=2  SENSITIVE Sensitive     PIP/TAZO <=4 SENSITIVE Sensitive     Extended ESBL NEGATIVE Sensitive     * ESCHERICHIA COLI   Group b  strep(s.agalactiae)isolated - MIC*    CLINDAMYCIN <=0.25 SENSITIVE Sensitive     AMPICILLIN <=0.25 SENSITIVE Sensitive     ERYTHROMYCIN <=0.12 SENSITIVE Sensitive     VANCOMYCIN 0.5 SENSITIVE Sensitive     CEFTRIAXONE <=0.12 SENSITIVE Sensitive     LEVOFLOXACIN 1 SENSITIVE Sensitive     * GROUP B STREP(S.AGALACTIAE)ISOLATED  Blood Culture (routine x 2)     Status: Abnormal   Collection Time: 10/25/16 12:54 PM  Result Value Ref Range Status   Specimen Description BLOOD LEFT ARM  Final   Special Requests BOTTLES DRAWN AEROBIC AND ANAEROBIC AER7ML ANA8ML  Final   Culture  Setup Time   Final    GRAM NEGATIVE RODS GRAM POSITIVE COCCI IN BOTH AEROBIC AND ANAEROBIC BOTTLES CRITICAL VALUE NOTED.  VALUE IS CONSISTENT WITH PREVIOUSLY REPORTED AND CALLED VALUE. CONFIRMED BY [facility]    Culture (A)  Final    ESCHERICHIA COLI GROUP B STREP(S.AGALACTIAE)ISOLATED SUSCEPTIBILITIES PERFORMED ON PREVIOUS CULTURE WITHIN THE LAST 5 DAYS. Performed at [facility]    Report Status 10/29/2016 FINAL  Final  Blood Culture ID Panel (Reflexed)     Status: Abnormal   Collection Time: 10/25/16 12:54 PM  Result Value Ref Range Status   Enterococcus species NOT DETECTED NOT DETECTED Final   Listeria monocytogenes NOT DETECTED NOT DETECTED Final   Staphylococcus species NOT DETECTED NOT DETECTED Final   Staphylococcus aureus NOT DETECTED NOT DETECTED Final   Streptococcus species DETECTED (A) NOT DETECTED Final    Comment: CRITICAL RESULT CALLED TO, READ BACK BY AND VERIFIED WITH: MATT MCBANE AT 0159 ON 10/26/16 RWW    Streptococcus agalactiae DETECTED (A) NOT DETECTED Final    Comment: CRITICAL RESULT CALLED TO, READ BACK BY AND VERIFIED WITH: MATT MCBANE AT 0159 ON 10/26/16 RWW    Streptococcus pneumoniae NOT DETECTED NOT DETECTED Final   Streptococcus pyogenes NOT DETECTED NOT DETECTED Final   Acinetobacter baumannii NOT DETECTED NOT DETECTED Final   Enterobacteriaceae species DETECTED (A) NOT  DETECTED Final    Comment: CRITICAL RESULT CALLED TO, READ BACK BY AND VERIFIED WITH: MATT MCBANE AT 0159 ON 10/26/16 RWW    Enterobacter cloacae complex NOT DETECTED NOT DETECTED Final   Escherichia coli DETECTED (A) NOT DETECTED Final    Comment: CRITICAL RESULT CALLED TO, READ BACK BY AND VERIFIED WITH: MATT MCBANE AT 0159 ON 10/26/16 RWW    Klebsiella oxytoca NOT DETECTED NOT DETECTED Final   Klebsiella pneumoniae NOT DETECTED NOT DETECTED Final   Proteus species NOT DETECTED NOT DETECTED Final   Serratia marcescens NOT DETECTED NOT DETECTED Final   Carbapenem resistance NOT DETECTED NOT DETECTED Final   Haemophilus influenzae NOT DETECTED NOT DETECTED Final   Neisseria meningitidis NOT DETECTED NOT DETECTED Final   Pseudomonas aeruginosa NOT DETECTED NOT DETECTED Final   Candida albicans NOT DETECTED NOT DETECTED Final   Candida glabrata NOT DETECTED NOT DETECTED Final   Candida krusei NOT DETECTED NOT DETECTED Final   Candida parapsilosis NOT DETECTED NOT DETECTED Final   Candida tropicalis NOT DETECTED NOT DETECTED Final  CULTURE, BLOOD (ROUTINE X 2) w Reflex to ID Panel     Status: None (Preliminary result)   Collection Time: 10/29/16  5:41 PM  Result Value Ref Range Status   Specimen Description BLOOD  LRFT [facility]  Final   Special  Requests   Final    BOTTLES DRAWN AEROBIC AND ANAEROBIC  AER 11 ML ANA 8 ML   Culture NO GROWTH 3 DAYS  Final   Report Status PENDING  Incomplete  CULTURE, BLOOD (ROUTINE X 2) w Reflex to ID Panel     Status: None (Preliminary result)   Collection Time: 10/29/16  5:48 PM  Result Value Ref Range Status   Specimen Description BLOOD RIGHT HAND  Final   Special Requests   Final    BOTTLES DRAWN AEROBIC AND ANAEROBIC  AER 9 ML ANA 9 ML   Culture NO GROWTH 3 DAYS  Final   Report Status PENDING  Incomplete     Coagulation Studies: No results for input(s): LABPROT, INR in the last 72 hours.  Urinalysis:  Recent Labs  10/30/16 1036  COLORURINE  YELLOW*  LABSPEC 1.015  PHURINE 5.0  GLUCOSEU NEGATIVE  HGBUR MODERATE*  BILIRUBINUR NEGATIVE  KETONESUR 5*  PROTEINUR 100*  NITRITE NEGATIVE  LEUKOCYTESUR LARGE*     Imaging: US Renal  Result Date: 10/31/2016 CLINICAL DATA:  Renal failure. EXAM: RENAL / URINARY TRACT ULTRASOUND COMPLETE COMPARISON:  CT 10/28/2016 FINDINGS: Right Kidney: Length: 12.3 cm. Small amount of perinephric fluid and gas adjacent to the kidney as seen on comparison CT. No hydronephrosis. Left Kidney: Length: 14.3 cm.  No hydronephrosis. Bladder: Collapse from Foley catheter. IMPRESSION: 1. No hydronephrosis. 2. Small amount perinephric fluid and gas along the RIGHT renal capsule does not appear progressed from comparison CT of 10/28/2016. Electronically Signed   By: Suzy Bouchard M.D.   On: 10/31/2016 14:29     Assessment & Plan: Patient is a 76 y.o. male with medical problems of Rectal cancer, status post colectomy, currently with a colostomy bag, insulin-dependent diabetes, who was admitted to [facility] on 10/25/2016   1. Acute renal failure - Differential includes Pre-Renal leading to ATN, rhabdomyolysis, sepsis - Known prior creatinine is 0.95 from December 2016 Urine output 350 cc last 24 hrs despite gentle hydration - Prognosis appears poor - Palliative care team is working with him Due to many underlying severe medical conditions, patient is not a candidate for outpatient dialysis Hospice care is appropriate in this setting  2. Generalized edema Likely from third spacing   3. Emphysematous pyelonephritis and Escherichia coli, group B strep bacteremia Treatment as per urology and ID recommendations Not improving  4. Significant decubitus ulcers 8x5 cm - work up in progress to look for infection/abscess

## 2016-11-01 NOTE — Progress Notes (Signed)
Palliative to meet with patient and friend, Toula Moos tomorrow 11/01/16 at [place], [place] Palliative Medicine Team  Phone: [phone] Fax: [phone]

## 2016-11-01 NOTE — Progress Notes (Signed)
New hospice home referral received from [facility]. Hospital care team made aware.Plan will be for transfer to the hospice home once bed is available. Patient information faxed to hospice referral. Thank you. Flo Shanks RN, BSN, [facility] [place], hospital liaison [phone] c

## 2016-11-01 NOTE — Progress Notes (Signed)
Urology consult f/u  11/01/16  Subjective: Remains hemodynamically stable and afebrile although mental status appears to be deteriorating. Placed on suicide precautions last night.    Curiously, no urine output recorded for past 24 hours, however, there is at least 100 cc in his Foley bag this morning.  Creatinine continues to rise, up to 4.6 this a.m.  Renal ultrasound yesterday shows stable right sided perinephric fluid and gas, not amenable to drainage. No other new changes, no hydronephrosis or evidence of obstruction.  Objective: Vital signs in last 24 hours: Temp:  [97.5 F (36.4 C)-97.9 F (36.6 C)] 97.8 F (36.6 C) (01/05 0729) Pulse Rate:  [78-94] 78 (01/05 0729) Resp:  [18] 18 (01/05 0729) BP: (122-148)/(51-68) 144/51 (01/05 0729) SpO2:  [96 %-100 %] 100 % (01/05 0729)  Intake/Output from previous day: 01/04 0701 - 01/05 0700 In: 1100 [I.V.:1000; IV Piggyback:100] Out: 0  Intake/Output this shift: No intake/output data recorded.  Physical Exam:  Constitutional: Alert but mildly lethargic. He is conversive but limited. Sitter at bedside. HEENT: [place] AT, moist mucus membranes. Trachea midline, no masses. Respiratory: Normal respiratory effort, no increased work of breathing. GI: Abdomen is soft, non tender, non distended, no abdominal masses.   Colostomy in place. GU: No CVA tenderness.   Skin: Skin changes noted on bilateral toes. Neurologic: Grossly intact, no focal deficits, moving all 4 extremities. Psychiatric: Normal mood and affect.  Lab Results:  Recent Labs  10/31/16 0428 11/01/16 0604  HGB 9.1* 8.4*  HCT 26.8* 25.4*   BMET  Recent Labs  10/31/16 0428 11/01/16 0604  NA 136 133*  K 4.1 3.8  CL 107 105  CO2 20* 18*  GLUCOSE 172* 157*  BUN 67* 72*  CREATININE 3.14* 4.60*  CALCIUM 7.6* 7.6*   No results for input(s): LABPT, INR in the last 72 hours. No results for input(s): LABURIN in the last 72 hours. Results for orders placed or  performed during the hospital encounter of 10/25/16  Urine culture     Status: Abnormal   Collection Time: 10/25/16 12:36 PM  Result Value Ref Range Status   Specimen Description URINE, RANDOM  Final   Special Requests NONE  Final   Culture (A)  Final    >=100,000 COLONIES/mL GROUP B STREP(S.AGALACTIAE)ISOLATED TESTING AGAINST S. AGALACTIAE NOT ROUTINELY PERFORMED DUE TO PREDICTABILITY OF AMP/PEN/VAN SUSCEPTIBILITY. Performed at [facility]    Report Status 10/26/2016 FINAL  Final  Blood Culture (routine x 2)     Status: Abnormal   Collection Time: 10/25/16 12:54 PM  Result Value Ref Range Status   Specimen Description BLOOD RIGHT ARM  Final   Special Requests   Final    BOTTLES DRAWN AEROBIC AND ANAEROBIC ANA14ML AER13ML   Culture  Setup Time   Final    GRAM POSITIVE COCCI GRAM NEGATIVE RODS IN BOTH AEROBIC AND ANAEROBIC BOTTLES CONFIRMED BY RWW CRITICAL RESULT CALLED TO, READ BACK BY AND VERIFIED WITH: MATT MCBANE AT [place] ON 10/26/16 RWW Performed at [facility]    Culture (A)  Final    ESCHERICHIA COLI GROUP B STREP(S.AGALACTIAE)ISOLATED    Report Status 10/29/2016 FINAL  Final   Organism ID, Bacteria ESCHERICHIA COLI  Final   Organism ID, Bacteria GROUP B STREP(S.AGALACTIAE)ISOLATED  Final      Susceptibility   Escherichia coli - MIC*    AMPICILLIN <=2 SENSITIVE Sensitive     CEFAZOLIN <=4 SENSITIVE Sensitive     CEFEPIME <=1 SENSITIVE Sensitive     CEFTAZIDIME <=  1 SENSITIVE Sensitive     CEFTRIAXONE <=1 SENSITIVE Sensitive     CIPROFLOXACIN <=0.25 SENSITIVE Sensitive     GENTAMICIN <=1 SENSITIVE Sensitive     IMIPENEM <=0.25 SENSITIVE Sensitive     TRIMETH/SULFA <=20 SENSITIVE Sensitive     AMPICILLIN/SULBACTAM <=2 SENSITIVE Sensitive     PIP/TAZO <=4 SENSITIVE Sensitive     Extended ESBL NEGATIVE Sensitive     * ESCHERICHIA COLI   Group b strep(s.agalactiae)isolated - MIC*    CLINDAMYCIN <=0.25 SENSITIVE Sensitive     AMPICILLIN <=0.25 SENSITIVE  Sensitive     ERYTHROMYCIN <=0.12 SENSITIVE Sensitive     VANCOMYCIN 0.5 SENSITIVE Sensitive     CEFTRIAXONE <=0.12 SENSITIVE Sensitive     LEVOFLOXACIN 1 SENSITIVE Sensitive     * GROUP B STREP(S.AGALACTIAE)ISOLATED  Blood Culture (routine x 2)     Status: Abnormal   Collection Time: 10/25/16 12:54 PM  Result Value Ref Range Status   Specimen Description BLOOD LEFT ARM  Final   Special Requests BOTTLES DRAWN AEROBIC AND ANAEROBIC AER7ML ANA8ML  Final   Culture  Setup Time   Final    GRAM NEGATIVE RODS GRAM POSITIVE COCCI IN BOTH AEROBIC AND ANAEROBIC BOTTLES CRITICAL VALUE NOTED.  VALUE IS CONSISTENT WITH PREVIOUSLY REPORTED AND CALLED VALUE. CONFIRMED BY [facility]    Culture (A)  Final    ESCHERICHIA COLI GROUP B STREP(S.AGALACTIAE)ISOLATED SUSCEPTIBILITIES PERFORMED ON PREVIOUS CULTURE WITHIN THE LAST 5 DAYS. Performed at [facility]    Report Status 10/29/2016 FINAL  Final  Blood Culture ID Panel (Reflexed)     Status: Abnormal   Collection Time: 10/25/16 12:54 PM  Result Value Ref Range Status   Enterococcus species NOT DETECTED NOT DETECTED Final   Listeria monocytogenes NOT DETECTED NOT DETECTED Final   Staphylococcus species NOT DETECTED NOT DETECTED Final   Staphylococcus aureus NOT DETECTED NOT DETECTED Final   Streptococcus species DETECTED (A) NOT DETECTED Final    Comment: CRITICAL RESULT CALLED TO, READ BACK BY AND VERIFIED WITH: MATT MCBANE AT 0159 ON 10/26/16 RWW    Streptococcus agalactiae DETECTED (A) NOT DETECTED Final    Comment: CRITICAL RESULT CALLED TO, READ BACK BY AND VERIFIED WITH: MATT MCBANE AT 0159 ON 10/26/16 RWW    Streptococcus pneumoniae NOT DETECTED NOT DETECTED Final   Streptococcus pyogenes NOT DETECTED NOT DETECTED Final   Acinetobacter baumannii NOT DETECTED NOT DETECTED Final   Enterobacteriaceae species DETECTED (A) NOT DETECTED Final    Comment: CRITICAL RESULT CALLED TO, READ BACK BY AND VERIFIED WITH: MATT MCBANE AT 0159 ON  10/26/16 RWW    Enterobacter cloacae complex NOT DETECTED NOT DETECTED Final   Escherichia coli DETECTED (A) NOT DETECTED Final    Comment: CRITICAL RESULT CALLED TO, READ BACK BY AND VERIFIED WITH: MATT MCBANE AT 0159 ON 10/26/16 RWW    Klebsiella oxytoca NOT DETECTED NOT DETECTED Final   Klebsiella pneumoniae NOT DETECTED NOT DETECTED Final   Proteus species NOT DETECTED NOT DETECTED Final   Serratia marcescens NOT DETECTED NOT DETECTED Final   Carbapenem resistance NOT DETECTED NOT DETECTED Final   Haemophilus influenzae NOT DETECTED NOT DETECTED Final   Neisseria meningitidis NOT DETECTED NOT DETECTED Final   Pseudomonas aeruginosa NOT DETECTED NOT DETECTED Final   Candida albicans NOT DETECTED NOT DETECTED Final   Candida glabrata NOT DETECTED NOT DETECTED Final   Candida krusei NOT DETECTED NOT DETECTED Final   Candida parapsilosis NOT DETECTED NOT DETECTED Final   Candida tropicalis NOT DETECTED  NOT DETECTED Final  CULTURE, BLOOD (ROUTINE X 2) w Reflex to ID Panel     Status: None (Preliminary result)   Collection Time: 10/29/16  5:41 PM  Result Value Ref Range Status   Specimen Description BLOOD  LRFT AC  Final   Special Requests   Final    BOTTLES DRAWN AEROBIC AND ANAEROBIC  AER 11 ML ANA 8 ML   Culture NO GROWTH 3 DAYS  Final   Report Status PENDING  Incomplete  CULTURE, BLOOD (ROUTINE X 2) w Reflex to ID Panel     Status: None (Preliminary result)   Collection Time: 10/29/16  5:48 PM  Result Value Ref Range Status   Specimen Description BLOOD RIGHT HAND  Final   Special Requests   Final    BOTTLES DRAWN AEROBIC AND ANAEROBIC  AER 9 ML ANA 9 ML   Culture NO GROWTH 3 DAYS  Final   Report Status PENDING  Incomplete    Studies/Results: US Renal  Result Date: 10/31/2016 CLINICAL DATA:  Renal failure. EXAM: RENAL / URINARY TRACT ULTRASOUND COMPLETE COMPARISON:  CT 10/28/2016 FINDINGS: Right Kidney: Length: 12.3 cm. Small amount of perinephric fluid and gas adjacent to  the kidney as seen on comparison CT. No hydronephrosis. Left Kidney: Length: 14.3 cm.  No hydronephrosis. Bladder: Collapse from Foley catheter. IMPRESSION: 1. No hydronephrosis. 2. Small amount perinephric fluid and gas along the RIGHT renal capsule does not appear progressed from comparison CT of 10/28/2016. Electronically Signed   By: Suzy Bouchard M.D.   On: 10/31/2016 14:29   Renal ultrasound personally reviewed.  Assessment/Plan: 76 yo with bacteremia, emphysematous pyelonephritis, bladder outlet obstruction.  1: Emphysematous pyelonephritis with bacteremia (E. Coli/ GBS)- Repeat blood cultures negative. Currently on Unasyn.   Continued progression of renal failure, low UOP.    Discussed case with interventional radiology, do not feel that he would benefit from percutaneous nephrostomy tube. No evidence of obstruction or significant collection.  Nephrectomy previously discussed, given that the disease is bilateral, would render the patient essentially anephric. Patient refuses intervention which is reasonable.  Palliative care consult pending which seems appropriate.  2. Acute renal failure- worsening  Maintain Foley Nephrology following  We will continue to follow closely    LOS: 7 days   Hollice Espy 11/01/2016, 8:18 AM

## 2016-11-01 NOTE — Progress Notes (Signed)
Nurse asked [place] to visit Pt. Pt was angry with the Nurses, and was not talking to anyone. Candelaria met Pt. Pt's friend at bedside. Pt told Ch, he had poor prognosis, not happy with bad report, pleased to know he has support of his friend, Toula Moos. Russell listened to Pt's concerns, shared scripture at request of his friend, and provided prayer and presence.     11/01/16 1600  Clinical Encounter Type  Visited With Patient;Health care provider;Other (Comment)  Visit Type Initial;Spiritual support;Social support  Referral From Nurse  Consult/Referral To Chaplain  Spiritual Encounters  Spiritual Needs Prayer;Emotional;Other (Comment)

## 2016-11-01 NOTE — Progress Notes (Signed)
Write spoke with [name] in his room, he was alert but confused. Writer then contacted patient's friend [name], [phone]) who voiced his understanding and agreement with transfer to the Hospice home when a bed becomes available. All patient information has been faxed to hospice referral.  Flo Shanks RN, BSN, [facility] [place], [facility] [phone] c

## 2016-11-01 NOTE — Consult Note (Signed)
[facility] Face-to-Face Psychiatry Consult   Reason for Consult:  Consult for 76 year old man with cancer. Concern about suicidal ideation Referring Physician:  Posey Pronto Patient Identification: Yadiel Aubry MRN:  [id number] Principal Diagnosis: Suicidal ideation Diagnosis:   Patient Active Problem List   Diagnosis Date Noted  . Suicidal ideation [R45.851] 11/01/2016  . Pyelonephritis [N12]   . Palliative care encounter [Z51.5]   . Comfort measures only status [Z51.5]   . Encounter for hospice care discussion [Z71.89]   . Pressure injury of skin [L89.90] 10/31/2016  . Nausea and vomiting [R11.2]   . Anemia [D64.9]   . Gastrointestinal hemorrhage with melena [K92.1]   . Vomiting [R11.10]   . Fall [W19.XXXA]   . Diabetes mellitus ([place]) [E11.9]   . Acute renal failure ([place]) [N17.9] 10/25/2016  . A-fib ([place]) [I48.91] 10/25/2016  . Incontinence [R32] 04/23/2016  . Diabetes mellitus without complication ([place]) [W09.8] 09/08/2015  . History of colonic polyps [Z86.010] 06/23/2015  . Uncontrolled diabetes mellitus ([place]) [E11.65] 03/01/2015  . Rectal cancer ([place]) [C20] 02/22/2015    Total Time spent with patient: 1 hour  Subjective:   Michial Disney is a 76 y.o. male patient admitted with "I don't want to suffer".  HPI:  Patient interviewed chart reviewed. This is a 76 year old man with rectal cancer renal failure multiple medical problems and complications from his cancer. Now at the stage of beginning palliative care. Apparently last night the patient made some statements about wanting to kill himself and has had a sitter in the room since then. I attempted to engage the patient in conversation about his thoughts and feelings around his situation. Patient was quite withdrawn and difficult to engage at times. At one time early in the interview the patient said he did not have any thought or intention of killing himself. Later on as I was getting ready to go he changed and said that he did have  thoughts about killing himself. He said that he did not want to lay here and suffer and he would just as soon get it over with. I asked him if he had any plan and at first he said sleeping pills. Later he pointed out that he really didn't have any way to try to kill himself. I tried to engage the patient in talking about what constitutes suffering for him and how we could best help him. Patient would not discuss pain management. Denied or wouldn't discuss having anything of value in his life that was important to him. Would not answer questions about what could make his situation more bearable for him.  Social history: Apparently there is no family involvement. He has one friend who has been contacted and is of some assistance. Patient is retired and had been living by himself.  Medical history: Rectal cancer. Advance. Renal failure. Appears to be considered endstage and moving into palliative care  Substance abuse history: No information in the chart to suggest this was a past problem. Not an active problem.  Past Psychiatric History: Nothing in the chart to suggest any past psychiatric history. Patient denies ever having tried to kill or harm himself in the past. No evidence of any past treatment for depression. Patient was not very forthcoming about any other details.  Risk to Self: Is patient at risk for suicide?: No Risk to Others:   Prior Inpatient Therapy:   Prior Outpatient Therapy:    Past Medical History:  Past Medical History:  Diagnosis Date  . Cancer (HCC)    Rectal  .  Colon polyp   . Diabetes mellitus without complication ([place])   . Hard of hearing     Past Surgical History:  Procedure Laterality Date  . ABDOMINAL PERINEAL BOWEL RESECTION N/A 07/21/2015   Procedure: Removal of the rectum, anus, and right colon; formation of permanent colostomy;  Surgeon: Robert Bellow, MD;  Location: [facility] ORS;  Service: General;  Laterality: N/A;  . CATARACT EXTRACTION Right   .  COLONOSCOPY  2013   Dr. Dionne Milo  . COLONOSCOPY N/A 03/15/2015   Procedure: COLONOSCOPY;  Surgeon: Robert Bellow, MD;  Location: [facility] ENDOSCOPY;  Service: Endoscopy;  Laterality: N/A;  . COLONOSCOPY WITH PROPOFOL N/A 08/21/2016   Procedure: COLONOSCOPY WITH PROPOFOL;  Surgeon: Robert Bellow, MD;  Location: [facility] ENDOSCOPY;  Service: Endoscopy;  Laterality: N/A;  . FLEXIBLE SIGMOIDOSCOPY N/A 03/09/2015   Procedure: FLEXIBLE SIGMOIDOSCOPY/rectal biopsy;  Surgeon: Robert Bellow, MD;  Location: [facility] ORS;  Service: General;  Laterality: N/A;  . NO PAST SURGERIES    . PORTACATH PLACEMENT Right 03/09/2015   Procedure: INSERTION PORT-A-CATH;  Surgeon: Robert Bellow, MD;  Location: [facility] ORS;  Service: General;  Laterality: Right;   Family History: History reviewed. No pertinent family history. Family Psychiatric  History: Unknown Social History:  History  Alcohol Use  . 1.8 oz/week  . 3 Shots of liquor per week     History  Drug Use No    Social History   Social History  . Marital status: Divorced    Spouse name: N/A  . Number of children: N/A  . Years of education: N/A   Social History Main Topics  . Smoking status: Former Smoker    Packs/day: 1.00    Years: 3.00    Types: Cigarettes, Pipe, Cigars    Quit date: 10/28/1978  . Smokeless tobacco: Never Used  . Alcohol use 1.8 oz/week    3 Shots of liquor per week  . Drug use: No  . Sexual activity: Not Asked   Other Topics Concern  . None   Social History Narrative  . None   Additional Social History:    Allergies:  No Known Allergies  Labs:  Results for orders placed or performed during the hospital encounter of 10/25/16 (from the past 48 hour(s))  Glucose, capillary     Status: Abnormal   Collection Time: 10/30/16  4:15 PM  Result Value Ref Range   Glucose-Capillary 143 (H) 65 - 99 mg/dL  Glucose, capillary     Status: Abnormal   Collection Time: 10/30/16  9:36 PM  Result Value Ref Range    Glucose-Capillary 130 (H) 65 - 99 mg/dL   Comment 1 Notify RN    Comment 2 Document in Chart   CBC     Status: Abnormal   Collection Time: 10/31/16  4:28 AM  Result Value Ref Range   WBC 10.7 (H) 3.8 - 10.6 K/uL   RBC 2.90 (L) 4.40 - 5.90 MIL/uL   Hemoglobin 9.1 (L) 13.0 - 18.0 g/dL   HCT 26.8 (L) 40.0 - 52.0 %   MCV 92.6 80.0 - 100.0 fL   MCH 31.3 26.0 - 34.0 pg   MCHC 33.8 32.0 - 36.0 g/dL   RDW 16.0 (H) 11.5 - 14.5 %   Platelets 170 150 - 440 K/uL  Basic metabolic panel     Status: Abnormal   Collection Time: 10/31/16  4:28 AM  Result Value Ref Range   Sodium 136 135 - 145 mmol/L   Potassium 4.1  3.5 - 5.1 mmol/L   Chloride 107 101 - 111 mmol/L   CO2 20 (L) 22 - 32 mmol/L   Glucose, Bld 172 (H) 65 - 99 mg/dL   BUN 67 (H) 6 - 20 mg/dL   Creatinine, Ser 3.14 (H) 0.61 - 1.24 mg/dL   Calcium 7.6 (L) 8.9 - 10.3 mg/dL   GFR calc non Af Amer 18 (L) >60 mL/min   GFR calc Af Amer 21 (L) >60 mL/min    Comment: (NOTE) The eGFR has been calculated using the CKD EPI equation. This calculation has not been validated in all clinical situations. eGFR's persistently <60 mL/min signify possible Chronic Kidney Disease.    Anion gap 9 5 - 15  Glucose, capillary     Status: Abnormal   Collection Time: 10/31/16  7:19 AM  Result Value Ref Range   Glucose-Capillary 218 (H) 65 - 99 mg/dL  Glucose, capillary     Status: Abnormal   Collection Time: 10/31/16 11:40 AM  Result Value Ref Range   Glucose-Capillary 182 (H) 65 - 99 mg/dL  Glucose, capillary     Status: Abnormal   Collection Time: 10/31/16  4:37 PM  Result Value Ref Range   Glucose-Capillary 142 (H) 65 - 99 mg/dL  Glucose, capillary     Status: Abnormal   Collection Time: 10/31/16  8:29 PM  Result Value Ref Range   Glucose-Capillary 135 (H) 65 - 99 mg/dL  CBC     Status: Abnormal   Collection Time: 11/01/16  6:04 AM  Result Value Ref Range   WBC 10.8 (H) 3.8 - 10.6 K/uL   RBC 2.71 (L) 4.40 - 5.90 MIL/uL   Hemoglobin 8.4 (L)  13.0 - 18.0 g/dL   HCT 25.4 (L) 40.0 - 52.0 %   MCV 93.8 80.0 - 100.0 fL   MCH 31.1 26.0 - 34.0 pg   MCHC 33.2 32.0 - 36.0 g/dL   RDW 16.2 (H) 11.5 - 14.5 %   Platelets 195 150 - 440 K/uL  Basic metabolic panel     Status: Abnormal   Collection Time: 11/01/16  6:04 AM  Result Value Ref Range   Sodium 133 (L) 135 - 145 mmol/L   Potassium 3.8 3.5 - 5.1 mmol/L   Chloride 105 101 - 111 mmol/L   CO2 18 (L) 22 - 32 mmol/L   Glucose, Bld 157 (H) 65 - 99 mg/dL   BUN 72 (H) 6 - 20 mg/dL   Creatinine, Ser 4.60 (H) 0.61 - 1.24 mg/dL   Calcium 7.6 (L) 8.9 - 10.3 mg/dL   GFR calc non Af Amer 11 (L) >60 mL/min   GFR calc Af Amer 13 (L) >60 mL/min    Comment: (NOTE) The eGFR has been calculated using the CKD EPI equation. This calculation has not been validated in all clinical situations. eGFR's persistently <60 mL/min signify possible Chronic Kidney Disease.    Anion gap 10 5 - 15  Glucose, capillary     Status: Abnormal   Collection Time: 11/01/16  7:31 AM  Result Value Ref Range   Glucose-Capillary 150 (H) 65 - 99 mg/dL  Glucose, capillary     Status: Abnormal   Collection Time: 11/01/16 12:29 PM  Result Value Ref Range   Glucose-Capillary 166 (H) 65 - 99 mg/dL    Current Facility-Administered Medications  Medication Dose Route Frequency Provider Last Rate Last Dose  . acetaminophen (TYLENOL) tablet 650 mg  650 mg Oral Q6H PRN Hillary Bow, MD  Or  . acetaminophen (TYLENOL) suppository 650 mg  650 mg Rectal Q6H PRN Srikar Sudini, MD      . albuterol (PROVENTIL) (2.5 MG/3ML) 0.083% nebulizer solution 2.5 mg  2.5 mg Nebulization Q2H PRN Srikar Sudini, MD      . diphenoxylate-atropine (LOMOTIL) 2.5-0.025 MG per tablet 1 tablet  1 tablet Oral QID PRN Hillary Bow, MD      . HYDROcodone-acetaminophen (NORCO/VICODIN) 5-325 MG per tablet 1-2 tablet  1-2 tablet Oral Q4H PRN Hillary Bow, MD   2 tablet at 10/31/16 1210  . LORazepam (ATIVAN) injection 1 mg  1 mg Intravenous Q4H PRN  Basilio Cairo, NP      . morphine CONCENTRATE 10 MG/0.5ML oral solution 5 mg  5 mg Oral Q1H PRN Basilio Cairo, NP      . ondansetron [facility]) tablet 4 mg  4 mg Oral Q6H PRN Hillary Bow, MD       Or  . ondansetron (ZOFRAN) injection 4 mg  4 mg Intravenous Q6H PRN Srikar Sudini, MD      . oxybutynin (DITROPAN-XL) 24 hr tablet 10 mg  10 mg Oral QHS Hillary Bow, MD   10 mg at 10/31/16 2116  . oxyCODONE (OXYCONTIN) 12 hr tablet 10 mg  10 mg Oral Q12H Hillary Bow, MD   10 mg at 11/01/16 0903  . polyethylene glycol (MIRALAX / GLYCOLAX) packet 17 g  17 g Oral Daily PRN Srikar Sudini, MD      . sodium chloride flush (NS) 0.9 % injection 3 mL  3 mL Intravenous Q12H Srikar Sudini, MD   3 mL at 11/01/16 0904  . zolpidem (AMBIEN) tablet 5 mg  5 mg Oral QHS PRN Basilio Cairo, NP        Musculoskeletal: Strength & Muscle Tone: decreased Gait & Station: unable to stand Patient leans: Backward  Psychiatric Specialty Exam: Physical Exam  Psychiatric: His affect is blunt. His speech is delayed. He is slowed and withdrawn. He expresses suicidal ideation. He expresses no suicidal plans. He is noncommunicative. He is inattentive.    Review of Systems  Psychiatric/Behavioral: Positive for suicidal ideas. Negative for depression, hallucinations and substance abuse. The patient is not nervous/anxious.     Blood pressure 139/69, pulse 72, temperature 97.7 F (36.5 C), temperature source Oral, resp. rate 18, height 6' 2"  (1.88 m), weight 90 kg (198 lb 6.4 oz), SpO2 100 %.Body mass index is 25.47 kg/m.  General Appearance: Disheveled  Eye Contact:  Minimal  Speech:  Garbled and Slow  Volume:  Decreased  Mood:  Dysphoric  Affect:  Constricted  Thought Process:  Disorganized  Orientation:  NA  Thought Content:  Rumination  Suicidal Thoughts:  Yes.  without intent/plan  Homicidal Thoughts:  No  Memory:  Immediate;   Fair Recent;   Poor Remote;   Fair  Judgement:  Impaired  Insight:  Shallow   Psychomotor Activity:  Decreased  Concentration:  Concentration: Poor  Recall:  [employer] of Knowledge:  Fair  Language:  Poor  Akathisia:  No  Handed:  Right  AIMS (if indicated):     Assets:  Desire for Improvement  ADL's:  Impaired  Cognition:  Impaired,  Mild  Sleep:        Treatment Plan Summary: Plan This is a 76 year old man with a relatively new diagnosis of cancer. I spoke with the nursing staff before coming to see the patient. They told me that there had been a lot of new  developments since last night with the decision to go to palliative care. My impression of the patient in talking with him is that he looks rather stunned. He did not seem to be thinking very clearly. He told me himself that he didn't feel like he could think clearly because he was focused on dying. Patient is clearly in acute grief and is overwhelmed by the situation. I was not able to get him to engage in a discussion about any details of how to help with his situation right now. As far as suicidality I don't think that we can assume that he has a major depression and I would not presume to add any medication for it. I am on the fence about the benefit of a sitter. Overall I don't think it's very likely that he will act out to try to do anything to harm himself. There is not much if anything he could do in the hospital to harm himself that wouldn't be most likely to cause pain and discomfort which she definitely does not want. Wouldn't let him have any medication that he might stockpile. Otherwise he probably does not need the sitter in the room. If he is still in the hospital after the weekend I will follow-up and I will sign this out to the psychiatrist on call.  Disposition: Patient does not meet criteria for psychiatric inpatient admission. Supportive therapy provided about ongoing stressors.  Alethia Berthold, MD 11/01/2016 3:48 PM

## 2016-11-01 NOTE — Plan of Care (Signed)
Patient refusing any "fluids" going through IV at this time.  He understands that he will now have the choice to drink fluids or not, but will no longer be receiving IV replenishment.

## 2016-11-01 NOTE — Plan of Care (Signed)
Problem: Education: Goal: Knowledge of [facility] Education information/materials will improve Outcome: Progressing VSS, free of falls during shift. Pt endorsed SI to nursing staff, stated he wanted "a whole bottle of pills" because he's "ready to quit."  Pt also stated he was ready to leave ASAP, A&Ox2, unable to verbalize where he was or why he was here.  Dr. Ara Kussmaul paged, one-time dose Ambien given, 1:1 sitter initiated, psych c/s ordered.  Pt able to sleep during shift.  No other complaints.  Toe wound care done per order.  Bed in low position, sitter at bedside.  Call bell within reach, [place].

## 2016-11-01 NOTE — Consult Note (Signed)
Consultation Note Date: 11/01/2016   Patient Name: Gary Hampton  DOB: 01-26-1941  MRN: [id number]  Age / Sex: 76 y.o., male  PCP: Albina Billet, MD Referring Physician: Fritzi Mandes, MD  Reason for Consultation: Establishing goals of care  HPI/Patient Profile: 76 y.o. male  with past medical history of diabetes, colon polyps s/p colectomy with colostomy, and rectal cancer admitted on 10/25/2016 with fall, abdominal cramping, and vomiting. In ED, sepsis secondary to Escherichia coli and group B strep bacteremia. CT abdomen reveals increased ascites and worsening right-sided pyelonephritis, on Unasyn. Also with acute on chronic renal failure. Kidney functions worsening. 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 Poor prognosis per attending-recommending hospice. Palliative medicine consultation for goals of care.    Clinical Assessment and Goals of Care: Met at the bedside along with patient and friend, Toula Moos, to discuss diagnosis, prognosis, [place], EOL wishes, disposition and options. Introduced palliative medicine as specialized medical care for people living with serious illness. It focuses on providing relief from the symptoms and stress of a serious illness. The goal is to improve quality of life for both the patient and the family.  We discussed a brief life review of the patient. Prior to this hospitalization, patient was living independently at home. He tells me he has been independent all of his life and that he has no family. I attempted to elicit values and goals of care important to the patient. He just tells me he doesn't want to suffer.    Discussed current medical status and the severity of his kidney failure with kidney functions worsening daily. Also sacral wound and possible fistula that may require surgical intervention. The difference between aggressive medical  intervention and comfort care was considered in light of the patient's goals of care. Asked the patient to consider quality of life versus quantity of life with aggressive medical interventions. Discussed and educated on dialysis being a form of life support and that this would extend his life but not give him a better quality of life. Toula Moos tells me he has spoken of "never wanting to be cut into" again after what he went through with cancer.   He states "what if I want to live another year?" I tell him that if able to live another year, this year would consist of aggressive medical interventions, dialysis, most likely re-hospitalizations, and living in a skilled nursing facility, which doesn't line up with his wish for being independent again.   Hospice services were discussed and recommended, explaining the focus to be on comfort, quality, dignity, and symptom management for the time he has left. Explained that time would most likely be short (days to weeks) due to his worsening kidney functions. Discussed expectations at EOL. Patient wanted time to think this through and talk with Toula Moos.    Later this afternoon, met with patient again. He tells me he wants "to relax" and move to the hospice home. Updated [name] via telephone who agrees with his decision to transition to comfort care.  Answered questions. [name] and Toula Moos have my contact information. Declined [facility] Choices copy.    SUMMARY OF RECOMMENDATIONS    DNR/DNI  Comfort measures only. No escalation of care. Discontinued medications/labs/interventions not aimed at comfort.   Wound care as needed for comfort.  RN may pronounce.  Symptom management--see below.  Social work consulted for residential hospice.  Discussed with Dr. Posey Pronto. Will leave psych consult/safety sitter in place at this time. Suicidal ideation last night.   Code Status/Advance Care Planning:  DNR   Symptom Management:   Norco 1-2 tab PO q4h prn  pain  Roxanol 40m PO q1h prn severe pain/dyspnea/air hunger  Ativan 121mIV q4h prn anxiety  Ambien 42m32mO qHS prn  Nebs prn wheezing for comfort  Miralax as needed for constipation  Palliative Prophylaxis:   Aspiration, Delirium Protocol, Frequent Pain Assessment, Palliative Wound Care and Turn Reposition  Additional Recommendations (Limitations, Scope, Preferences):  Full Comfort Care   Psycho-social/Spiritual:   Desire for further Chaplaincy support:no  Additional Recommendations: Caregiving  Support/Resources and Education on Hospice  Prognosis:   < 2 weeks renal failure with worsening kidney functions, decreased urine output, and not pursuing dialysis. 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   Discharge Planning: Hospice facility      Primary Diagnoses: Present on Admission: . [facility] [place] [facility] ([facility] [facility] I have reviewed the medical record, interviewed the patient and family, and examined the patient. The following aspects are pertinent.  Past Medical History:  Diagnosis Date  . Cancer (HCC)    Rectal  . Colon polyp   . Diabetes mellitus without complication ([place]CC[place] . Hard of hearing    Social History   Social History  . Marital status: Divorced    Spouse name: N/A  . Number of children: N/A  . Years of education: N/A   Social History Main Topics  . Smoking status: Former Smoker    Packs/day: 1.00    Years: 3.00    Types: Cigarettes, Pipe, Cigars    Quit date: 10/28/1978  . Smokeless tobacco: Never Used  . Alcohol use 1.8 oz/week    3 Shots of liquor per week  . Drug use: No  . Sexual activity: Not Asked   Other Topics Concern  . None   Social History Narrative  . None   History reviewed. No pertinent family history. Scheduled Meds: . oxybutynin  10 mg Oral QHS  . oxyCODONE  10 mg Oral Q12H  . sodium chloride flush  3 mL Intravenous Q12H    Continuous Infusions: PRN Meds:.acetaminophen **OR** acetaminophen, albuterol, diphenoxylate-atropine, HYDROcodone-acetaminophen, LORazepam, morphine CONCENTRATE, ondansetron **OR** ondansetron (ZOFRAN) IV, polyethylene glycol, zolpidem Medications Prior to Admission:  Prior to Admission medications   Medication Sig Start Date End Date Taking? Authorizing Provider  acetaminophen (TYLENOL) 325 MG tablet Take 2 tablets (650 mg total) by mouth every 6 (six) hours as needed for mild pain (or temp > 100). 07/27/15  Yes JefRobert BellowD  diphenoxylate-atropine (LOMOTIL) 2.5-0.025 MG tablet Take by mouth every 6 (six) hours as needed for diarrhea or loose stools.   Yes Historical Provider, MD  LANTUS SOLOSTAR 100 UNIT/ML Solostar Pen Inject 23 Units into the skin daily with lunch.  03/01/16  Yes Historical Provider, MD  loperamide (IMODIUM) 2 MG capsule Take by mouth 2 (two) times daily.   Yes Historical Provider, MD  Melatonin 10 MG CAPS Take by mouth at  bedtime.   Yes Historical Provider, MD  oxybutynin (DITROPAN-XL) 10 MG 24 hr tablet Take 10 mg by mouth at bedtime.   Yes Historical Provider, MD  oxyCODONE (OXYCONTIN) 10 mg 12 hr tablet Take 10 mg by mouth every 8 (eight) hours as needed.   Yes Historical Provider, MD  oxyCODONE-acetaminophen (PERCOCET) 10-650 MG tablet Take 1 tablet by mouth at bedtime and may repeat dose one time if needed. Patient not taking: Reported on 10/25/2016 01/18/16 01/17/17  Robert Bellow, MD   No Known Allergies Review of Systems  Unable to perform ROS: Acuity of condition   Physical Exam  Constitutional: He is oriented to person, place, and time. He is cooperative. He appears ill.  Cardiovascular: An irregularly irregular rhythm present. Exam reveals distant heart sounds.   Pulmonary/Chest: Effort normal. He has decreased breath sounds.  Abdominal: Soft. Bowel sounds are decreased.  Neurological: He is alert and oriented to person, place, and time.  Hard of  hearing/irritable  Skin: Skin is warm and dry. There is pallor.  Psychiatric: His speech is delayed. He is withdrawn. He exhibits a depressed mood.  Nursing note and vitals reviewed.  Vital Signs: BP 139/69 (BP Location: Left Arm)   Pulse 72   Temp 97.7 F (36.5 C) (Oral)   Resp 18   Ht _0  (1.88 m)   Wt 90 kg (198 lb 6.4 oz)   SpO2 100%   BMI 25.47 kg/m  Pain Assessment: No/denies pain POSS *See Group Information*: 1-Acceptable,Awake and alert Pain Score: 4   SpO2: SpO2: 100 % O2 Device:SpO2: 100 % O2 Flow Rate: .O2 Flow Rate (L/min): 2.5 L/min  IO: Intake/output summary:   Intake/Output Summary (Last 24 hours) at 11/01/16 1351 Last data filed at 11/01/16 6283  Gross per 24 hour  Intake              200 ml  Output              350 ml  Net             -150 ml    LBM: Last BM Date: 10/31/16 Baseline Weight: Weight: 92.5 kg (203 lb 14.8 oz) Most recent weight: Weight: 90 kg (198 lb 6.4 oz)     Palliative Assessment/Data: PPS 30%   Flowsheet Rows   Flowsheet Row Most Recent Value  Intake Tab  Referral Department  Hospitalist  Unit at Time of Referral  Oncology Unit  Palliative Care Primary Diagnosis  -- [GI Bleed]  Date Notified  10/25/16  Palliative Care Type  New Palliative care  Reason for referral  Clarify Goals of Care  Date of Admission  10/25/16  # of days IP prior to Palliative referral  0  Clinical Assessment  Palliative Performance Scale Score  30%  Psychosocial & Spiritual Assessment  Palliative Care Outcomes  Patient/Family meeting held?  Yes  Who was at the meeting?  patient and friend  Palliative Care Outcomes  Clarified goals of care, Counseled regarding hospice, Improved pain interventions, Improved non-pain symptom therapy, Provided end of life care assistance, Provided psychosocial or spiritual support, Changed to focus on comfort      Time In/Out: 6629-4765, 1315-1350  Time Total: 43mn  Greater than 50%  of this time was spent  counseling and coordinating care related to the above assessment and plan.  Signed by:   MIhor Dow FNP-C Palliative Medicine Team  Phone: [phone]: [phone]  Please contact Palliative Medicine Team phone at [phone]  for questions and concerns.  For individual provider: See Shea Evans

## 2016-11-01 NOTE — Clinical Social Work Note (Addendum)
CSW spoke to patient's friend [name] [phone] who is requesting patient to go to [facility]  CSW gave referral to [facility].  CSW was informed that [facility] does not have any beds available currently, CSW will be updated by Nurse Liason once bed is available.   Jones Broom. [place], MSW, [place]  11/01/2016 2:31 PM

## 2016-11-01 NOTE — Progress Notes (Signed)
Nutrition Brief Note  Chart reviewed and discussed with Palliative Medicine. Patient now transitioning to comfort care.   No further nutrition interventions warranted at this time. Please re-consult Dietitian as needed.   Willey Blade, MS, RD, LDN Pager: [phone] After Hours Pager: [phone]

## 2016-11-01 NOTE — Clinical Social Work Note (Signed)
CSW was informed that patient may need hospice facility, palliative to meet with patient and his friend today at 10am to discuss discharge plan.  CSW to continue to follow patient's progress throughout discharge planning.  Jones Broom. [place], MSW, [place]  11/01/2016 9:47 AM

## 2016-11-01 NOTE — Progress Notes (Signed)
[place] [facility] [place] NAME: Gary Hampton    MR#:  [id number]  DATE OF BIRTH:  1941/06/14  SUBJECTIVE:   -Clinically appears much weak and also confused. -Renal function is worsening, urine output is decreased -sitter in the room given expressing suicidal ideation REVIEW OF SYSTEMS:  Review of Systems  Unable to perform ROS: Mental status change    DRUG ALLERGIES:  No Known Allergies  VITALS:  Blood pressure (!) 144/51, pulse 78, temperature 97.8 F (36.6 C), temperature source Oral, resp. rate 18, height 6\' 2"  (1.88 m), weight 90 kg (198 lb 6.4 oz), SpO2 100 %.  PHYSICAL EXAMINATION:  Physical Exam  GENERAL:  76 y.o.-year-old patient lying in the bed with no acute distress. Appears confused and clinically worse. EYES: Pupils equal, round, reactive to light and accommodation. No scleral icterus. Extraocular muscles intact.  HEENT: Head atraumatic, normocephalic. Oropharynx and nasopharynx clear.  NECK:  Supple, no jugular venous distention. No thyroid enlargement, no tenderness.  LUNGS: Normal breath sounds bilaterally, no wheezing, rales,rhonchi or crepitation. No use of accessory muscles of respiration. Decreased bibasilar breath sounds. CARDIOVASCULAR: S1, S2 normal. No murmurs, rubs, or gallops.  ABDOMEN: Soft, nontender, nondistended. Bowel sounds present. No organomegaly or mass. Colostomy bag in place with no stool in it, foley+ EXTREMITIES: Appears erythematous, 2+ bilateral upper extremity edema and 1+ lower extremity edema. NEUROLOGIC: Following some commands today, global weakness noted  PSYCHIATRIC: The patient is alert at confused and very slow to respond. Unable to comprehend. SKIN: No obvious rash, lesion, or ulcer.    LABORATORY PANEL:   CBC  Recent Labs Lab 11/01/16 0604  WBC 10.8*  HGB 8.4*  HCT 25.4*  PLT 195    ------------------------------------------------------------------------------------------------------------------  Chemistries   Recent Labs Lab 10/25/16 1914 10/26/16 0310  11/01/16 0604  NA  --  133*  < > 133*  K  --  4.1  < > 3.8  CL  --  99*  < > 105  CO2  --  25  < > 18*  GLUCOSE  --  215*  < > 157*  BUN  --  86*  < > 72*  CREATININE  --  2.68*  < > 4.60*  CALCIUM  --  7.1*  < > 7.6*  MG 1.4*  --   --   --   AST  --  37  --   --   ALT  --  18  --   --   ALKPHOS  --  53  --   --   BILITOT  --  0.6  --   --   < > = values in this interval not displayed. ------------------------------------------------------------------------------------------------------------------  Cardiac Enzymes  Recent Labs Lab 10/25/16 1215  TROPONINI 0.05*   ------------------------------------------------------------------------------------------------------------------  RADIOLOGY:  US Renal  Result Date: 10/31/2016 CLINICAL DATA:  Renal failure. EXAM: RENAL / URINARY TRACT ULTRASOUND COMPLETE COMPARISON:  CT 10/28/2016 FINDINGS: Right Kidney: Length: 12.3 cm. Small amount of perinephric fluid and gas adjacent to the kidney as seen on comparison CT. No hydronephrosis. Left Kidney: Length: 14.3 cm.  No hydronephrosis. Bladder: Collapse from Foley catheter. IMPRESSION: 1. No hydronephrosis. 2. Small amount perinephric fluid and gas along the RIGHT renal capsule does not appear progressed from comparison CT of 10/28/2016. Electronically Signed   By: Suzy Bouchard M.D.   On: 10/31/2016 14:29    EKG:   Orders placed or performed during the hospital encounter of 10/25/16  .  EKG 12-Lead  . EKG 12-Lead  . EKG 12-Lead  . EKG 12-Lead  . EKG 12-Lead  . EKG 12-Lead    ASSESSMENT AND PLAN:   76 year old male with past medical history significant for rectal cancer status post colostomy bag, diabetes, hypertension presents to the hospital secondary to fall and noted to have sepsis.  #1  sepsis-secondary to Escherichia coli and group B strep bacteremia -Likely source urine. Has bilateral emphysematous pyelonephritis. -CT of the abdomen showing also has increased ascites and worsening right-sided pyelonephritis. -Appreciate infectious disease consult. -on Unasyn.  #2 acute renal failure-normal creatinine last December. Admitted with acute pyelonephritis and sepsis. -Foley catheter placed for urinary retention. Creatinine is worsening. Also decreased urine output. -Appreciate nephrology input. Trial of Lasix and albumin received. -Nephrectomy was discussed for his symptoms for emphysematous pyelonephritis, patient did not want any surgery at the time. Also not a candidate for nephrostomy tubes percutaneously as there is no hydronephrosis or focal collection of abscess per Dr Erlene Quan -Overall prognosis appears extremely poor at this time. -Palliative care consulted.  #3 rectal cancer-status post combined right hemicolectomy and perineal resection for rectal cancer almost more than a year ago. -Recent colonoscopy done as an outpatient. 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 We'll hold off on surgical consult as palliative care consult is pending  #4 melena-on admission. Has been stable. Continue PPI. -Appreciate GI consult. No further interventions at this time. -Advanced diet  #5 A. fib with RVR-was followed by cardiology initially. Echo with normal EF. -Secondary to sepsis.  #6 DVT prophylaxis- since hb stable, on SQ heparin   Overall prognosis appears extremely poor at this time. Palliative care consulted. Recommend hospice. Pt has no family but just a friend who is meeting with PC today.   All the records are reviewed and case discussed with Care Management/Social Workerr. Management plans discussed with the patient, family and they are in agreement.  CODE STATUS: DO NOT RESUSCITATE  TOTAL TIME TAKING CARE OF THIS PATIENT: 25  minutes.     Karlena Luebke M.D on 11/01/2016 at 9:23 AM  Between 7am to 6pm - Pager - [phone]  After 6pm go to [email or web address] - password [facility]  Office  [phone]  CC: Primary care physician; Albina Billet, MD

## 2016-11-02 NOTE — Progress Notes (Signed)
[facility] at [place] NAME: Gary Hampton    MR#:  [id number]  DATE OF BIRTH:  1941-10-28  SUBJECTIVE:   -Clinically appears much weak  -appears more alert  REVIEW OF SYSTEMS:  Review of Systems  Unable to perform ROS: Medical condition    DRUG ALLERGIES:  No Known Allergies  VITALS:  Blood pressure (!) 146/55, pulse 74, temperature 97.5 F (36.4 C), temperature source Oral, resp. rate 20, height 6' 2"  (1.88 m), weight 90 kg (198 lb 6.4 oz), SpO2 99 %.  PHYSICAL EXAMINATION:  Physical Exam  GENERAL:  76 y.o.-year-old patient lying in the bed with no acute distress. Appears confused and clinically worse. EYES: Pupils equal, round, reactive to light and accommodation. No scleral icterus. Extraocular muscles intact.  HEENT: Head atraumatic, normocephalic. Oropharynx and nasopharynx clear.  NECK:  Supple, no jugular venous distention. No thyroid enlargement, no tenderness.  LUNGS: Normal breath sounds bilaterally, no wheezing, rales,rhonchi or crepitation. No use of accessory muscles of respiration. Decreased bibasilar breath sounds. CARDIOVASCULAR: S1, S2 normal. No murmurs, rubs, or gallops.  ABDOMEN: Soft, nontender, nondistended. Bowel sounds present. No organomegaly or mass. Colostomy bag in place with no stool in it, foley+ EXTREMITIES: Appears erythematous, 2+ bilateral upper extremity edema and 1+ lower extremity edema. NEUROLOGIC: Following some commands today, global weakness noted  PSYCHIATRIC: The patient is alert at confused and very slow to respond. Unable to comprehend. SKIN: No obvious rash, lesion, or ulcer.    LABORATORY PANEL:   CBC  Recent Labs Lab 11/01/16 0604  WBC 10.8*  HGB 8.4*  HCT 25.4*  PLT 195   ------------------------------------------------------------------------------------------------------------------  Chemistries   Recent Labs Lab 11/01/16 0604  NA 133*  K 3.8  CL 105  CO2 18*    GLUCOSE 157*  BUN 72*  CREATININE 4.60*  CALCIUM 7.6*   ------------------------------------------------------------------------------------------------------------------  Cardiac Enzymes No results for input(s): TROPONINI in the last 168 hours. ------------------------------------------------------------------------------------------------------------------  RADIOLOGY:  US Renal  Result Date: 10/31/2016 CLINICAL DATA:  Renal failure. EXAM: RENAL / URINARY TRACT ULTRASOUND COMPLETE COMPARISON:  CT 10/28/2016 FINDINGS: Right Kidney: Length: 12.3 cm. Small amount of perinephric fluid and gas adjacent to the kidney as seen on comparison CT. No hydronephrosis. Left Kidney: Length: 14.3 cm.  No hydronephrosis. Bladder: Collapse from Foley catheter. IMPRESSION: 1. No hydronephrosis. 2. Small amount perinephric fluid and gas along the RIGHT renal capsule does not appear progressed from comparison CT of 10/28/2016. Electronically Signed   By: Suzy Bouchard M.D.   On: 10/31/2016 14:29    EKG:   Orders placed or performed during the hospital encounter of 10/25/16  . EKG 12-Lead  . EKG 12-Lead  . EKG 12-Lead  . EKG 12-Lead  . EKG 12-Lead  . EKG 12-Lead    ASSESSMENT AND PLAN:   76 year old male with past medical history significant for rectal cancer status post colostomy bag, diabetes, hypertension presents to the hospital secondary to fall and noted to have sepsis.  #1 sepsis-secondary to Escherichia coli and group B strep bacteremia -Likely source urine. Has bilateral emphysematous pyelonephritis. -CT of the abdomen showing also has increased ascites and worsening right-sided pyelonephritis. -Appreciate infectious disease consult. -was on Unasyn.  #2 acute renal failure-normal creatinine last December. Admitted with acute pyelonephritis and sepsis. -Foley catheter placed for urinary retention. Creatinine is worsening with decreased urine output. -Appreciate nephrology  input. -Nephrectomy was discussed for his symptoms for emphysematous pyelonephritis, patient did not want any surgery at  the time. Also not a candidate for nephrostomy tubes percutaneously as there is no hydronephrosis or focal collection of abscess per Dr Erlene Quan -Overall prognosis appears extremely poor at this time. -Palliative care consulted.  #3 rectal cancer-status post combined right hemicolectomy and perineal resection for rectal cancer almost more than a year ago. -Recent colonoscopy done as an outpatient. 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 We'll hold off on surgical consult as palliative care consult is pending  #4 melena-on admission. Has been stable. Continue PPI. -Appreciate GI consult. No further interventions at this time. -Advanced diet  #5 A. fib with RVR-was followed by cardiology initially. Echo with normal EF. -Secondary to sepsis.  #6 DVT prophylaxis- since hb stable, on SQ heparin   Overall prognosis appears extremely poor at this time. Palliative care consulted. Recommend hospice. PC met with friend Toula Moos and pt again and after lengthy discussion decision was made for COMFORT CARE and transfer to hospice home when bed opens up.   All the records are reviewed and case discussed with Care Management/Social Workerr.   CODE STATUS: DO NOT RESUSCITATE  TOTAL TIME TAKING CARE OF THIS PATIENT: 25 minutes.     Siah Kannan M.D on 11/02/2016 at 8:46 AM  Between 7am to 6pm - Pager - [phone]  After 6pm go to [email or web address] - password [email or web address]  Office  [phone]  CC: Primary care physician; Albina Billet, MD

## 2016-11-02 NOTE — Clinical Social Work Note (Signed)
CSW spoke with Hospice Admissions for an update. Currently, no beds are available at this time. The Hospice RN, Jackelyn Poling, will contact this CSW once one becomes available. CSW is following.  Santiago Bumpers, MSW, LCSW-A [phone]

## 2016-11-02 NOTE — Progress Notes (Signed)
[place] at [place] NAME: Deuce Paternoster    MR#:  [id number]  DATE OF BIRTH:  11-11-1940  SUBJECTIVE:   -Clinically appears much weak  -appears more alert  REVIEW OF SYSTEMS:  Review of Systems  Unable to perform ROS: Medical condition    DRUG ALLERGIES:  No Known Allergies  VITALS:  Blood pressure (!) 146/55, pulse 74, temperature 97.5 F (36.4 C), temperature source Oral, resp. rate 20, height 6' 2"  (1.88 m), weight 90 kg (198 lb 6.4 oz), SpO2 99 %.  PHYSICAL EXAMINATION:  Physical Exam  GENERAL:  76 y.o.-year-old patient lying in the bed with no acute distress. Appears confused and clinically worse. EYES: Pupils equal, round, reactive to light and accommodation. No scleral icterus. Extraocular muscles intact.  HEENT: Head atraumatic, normocephalic. Oropharynx and nasopharynx clear.  NECK:  Supple, no jugular venous distention. No thyroid enlargement, no tenderness.  LUNGS: Normal breath sounds bilaterally, no wheezing, rales,rhonchi or crepitation. No use of accessory muscles of respiration. Decreased bibasilar breath sounds. CARDIOVASCULAR: S1, S2 normal. No murmurs, rubs, or gallops.  ABDOMEN: Soft, nontender, nondistended. Bowel sounds present. No organomegaly or mass. Colostomy bag in place with no stool in it, foley+ EXTREMITIES: Appears erythematous, 2+ bilateral upper extremity edema and 1+ lower extremity edema. NEUROLOGIC: Following some commands today, global weakness noted  PSYCHIATRIC: The patient is alert at confused and very slow to respond. Unable to comprehend. SKIN: No obvious rash, lesion, or ulcer.    LABORATORY PANEL:   CBC  Recent Labs Lab 11/01/16 0604  WBC 10.8*  HGB 8.4*  HCT 25.4*  PLT 195   ------------------------------------------------------------------------------------------------------------------  Chemistries   Recent Labs Lab 11/01/16 0604  NA 133*  K 3.8  CL 105  CO2 18*    GLUCOSE 157*  BUN 72*  CREATININE 4.60*  CALCIUM 7.6*   ------------------------------------------------------------------------------------------------------------------  Cardiac Enzymes No results for input(s): TROPONINI in the last 168 hours. ------------------------------------------------------------------------------------------------------------------  RADIOLOGY:  No results found.  EKG:   Orders placed or performed during the hospital encounter of 10/25/16  . EKG 12-Lead  . EKG 12-Lead  . EKG 12-Lead  . EKG 12-Lead  . EKG 12-Lead  . EKG 12-Lead    ASSESSMENT AND PLAN:   76 year old male with past medical history significant for rectal cancer status post colostomy bag, diabetes, hypertension presents to the hospital secondary to fall and noted to have sepsis.  #1 sepsis-secondary to Escherichia coli and group B strep bacteremia -Likely source urine. Has bilateral emphysematous pyelonephritis. -CT of the abdomen showing also has increased ascites and worsening right-sided pyelonephritis. -Appreciate infectious disease consult. -was on Unasyn.  #2 acute renal failure-normal creatinine last December.-Foley catheter placed for urinary retention. Creatinine is worsening with decreased urine output. -Appreciate nephrology input. -Nephrectomy was discussed for his symptoms for emphysematous pyelonephritis, patient did not want any surgery at the time. Also not a candidate for nephrostomy tubes percutaneously as there is no hydronephrosis or focal collection of abscess per Dr Erlene Quan -Overall prognosis appears extremely poor at this time. -Palliative care consulted.  #3 rectal cancer-status post combined right hemicolectomy and perineal resection for rectal cancer almost more than a year ago. 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   #4 melena-on admission. Has been stable. Continue PPI. -Advanced diet  #5 A. fib with RVR-was  followed by cardiology initially. Echo with normal EF. -Secondary to sepsis.  #6 DVT prophylaxis- since hb stable, on SQ heparin  Overall prognosis appears extremely poor at this time. Palliative care consulted. Recommend hospice. PC met with friend Toula Moos and pt again and after lengthy discussion decision was made for COMFORT CARE and transfer to hospice home when bed opens up.   All the records are reviewed and case discussed with Care Management/Social Workerr.   CODE STATUS: DO NOT RESUSCITATE  TOTAL TIME TAKING CARE OF THIS PATIENT: 20 minutes.     Apollo Timothy M.D on 11/02/2016 at 4:26 PM  Between 7am to 6pm - Pager - [phone]  After 6pm go to [email or web address] - password [email or web address]  Office  [phone]  CC: Primary care physician; Albina Billet, MD

## 2016-11-03 LAB — CULTURE, BLOOD (ROUTINE X 2)
Culture: NO GROWTH
Culture: NO GROWTH

## 2016-11-04 MED ORDER — MORPHINE SULFATE (CONCENTRATE) 10 MG/0.5ML PO SOLN: 5.0000 mg | ORAL | 0 refills | Status: AC | PRN

## 2016-11-04 NOTE — Progress Notes (Signed)
Hospital care team notified that a bed is available at the hospice home. Writer attempted to contact patient's friend [name], message left to Secondary school teacher. CSW Evette Cristal and staff RN Stanton Kidney made aware. Will continue to attempt to arrange discharge to the hospice home today.  Flo Shanks RN, BSN, [facility] [place], hospital Liaison [phone] c

## 2016-11-04 NOTE — Progress Notes (Signed)
Follow up visit to new hospice home referral, patient seen sleeping. Per discussion with staff RN Stanton Kidney he was able to eat some cereal this morning. Both attending physician Dr. Posey Pronto and Palliative NP Ihor Dow feel patient remains appropriate for end of life care at the hospice home. Writer received a return call from Gary Hampton's friend Gary Hampton who has agreed to meet with hospice social worker Carola Rhine at the hospice home to sign consents. Writer to contact EMS when consents are signed. Hospital care team aware. Report called to the hospice home. Discharge summary faxed to hospice referral. Thank hyou. Flo Shanks RN, BSN, [facility] [place], hospital Liaison [phone] c

## 2016-11-04 NOTE — Discharge Summary (Signed)
[facility] at [place] NAME: Gary Hampton    MR#:  [id number]  DATE OF BIRTH:  [birth year]  DATE OF ADMISSION:  10/25/2016 ADMITTING PHYSICIAN: Hillary Bow, MD  DATE OF DISCHARGE: 11/04/16  PRIMARY CARE PHYSICIAN: Albina Billet, MD    ADMISSION DIAGNOSIS:  Hyponatremia [E87.1] Vomiting [R11.10] Hyperglycemia [R73.9] Gastrointestinal hemorrhage with melena [K92.1] Acute renal failure, unspecified acute renal failure type ([place]) [N17.9]  DISCHARGE DIAGNOSIS:  Ecoli Sepsis Emphysematous Pylenonephritis Acute Renal failure H/o Colon cancer SECONDARY DIAGNOSIS:   Past Medical History:  Diagnosis Date  . Cancer (HCC)    Rectal  . Colon polyp   . Diabetes mellitus without complication ([place])   . Hard of hearing     HOSPITAL COURSE:  76 year old male with past medical history significant for rectal cancer status post colostomy bag, diabetes, hypertension presents to the hospital secondary to fall and noted to have sepsis.  #1 sepsis-secondary to Escherichia coli and group B strep bacteremia -Likely source urine. Has bilateral emphysematous pyelonephritis. -CT of the abdomen showing also has increased ascites and worsening right-sided pyelonephritis. -was on Unasyn.  #2 acute renal failure-normal creatinine last December.-Foley catheter placed for urinary retention. Creatinine is worsening with decreased urine output. -Nephrectomy was discussed for his symptoms for emphysematous pyelonephritis, patient did not want any surgery at the time. Also not a candidate for nephrostomy tubes percutaneously as there is no hydronephrosis or focal collection of abscess per Dr Erlene Quan -Overall prognosis appears extremely poor at this time. -Palliative care consulted.  #3 rectal cancer-status post combined right hemicolectomy and perineal resection for rectal cancer almost more than a year ago. 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   #4 melena-on admission. Has been stable. -Advanced diet  #5 A. fib with RVR-was followed by cardiology initially. Echo with normal EF. -Secondary to sepsis.  #6 DVT prophylaxis- since hb stable, on SQ heparin   Overall prognosis appears extremely poor at this time. Palliative care consulted. Recommend hospice. Pt has bed at hospice home. Will d/c later today  CONSULTS OBTAINED:  Treatment Team:  Festus Aloe, MD Leonel Ramsay, MD Jonathon Bellows, MD Murlean Iba, MD Gonzella Lex, MD  DRUG ALLERGIES:  No Known Allergies  DISCHARGE MEDICATIONS:   Current Discharge Medication List    START taking these medications   Details  Morphine Sulfate (MORPHINE CONCENTRATE) 10 MG/0.5ML SOLN concentrated solution Take 0.25 mLs (5 mg total) by mouth every hour as needed for moderate pain or severe pain (dyspnea/air hunger). Qty: 180 mL, Refills: 0      CONTINUE these medications which have NOT CHANGED   Details  acetaminophen (TYLENOL) 325 MG tablet Take 2 tablets (650 mg total) by mouth every 6 (six) hours as needed for mild pain (or temp > 100). Qty: 100 tablet, Refills: 12    Melatonin 10 MG CAPS Take by mouth at bedtime.    oxybutynin (DITROPAN-XL) 10 MG 24 hr tablet Take 10 mg by mouth at bedtime.    oxyCODONE (OXYCONTIN) 10 mg 12 hr tablet Take 10 mg by mouth every 8 (eight) hours as needed.      STOP taking these medications     diphenoxylate-atropine (LOMOTIL) 2.5-0.025 MG tablet      LANTUS SOLOSTAR 100 UNIT/ML Solostar Pen      loperamide (IMODIUM) 2 MG capsule      oxyCODONE-acetaminophen (PERCOCET) 10-650 MG tablet         If  you experience worsening of your admission symptoms, develop shortness of breath, life threatening emergency, suicidal or homicidal thoughts you must seek medical attention immediately by calling 911 or calling your MD immediately  if symptoms less severe.  You Must read complete  instructions/literature along with all the possible adverse reactions/side effects for all the Medicines you take and that have been prescribed to you. Take any new Medicines after you have completely understood and accept all the possible adverse reactions/side effects.   Please note  You were cared for by a hospitalist during your hospital stay. If you have any questions about your discharge medications or the care you received while you were in the hospital after you are discharged, you can call the unit and asked to speak with the hospitalist on call if the hospitalist that took care of you is not available. Once you are discharged, your primary care physician will handle any further medical issues. Please note that NO REFILLS for any discharge medications will be authorized once you are discharged, as it is imperative that you return to your primary care physician (or establish a relationship with a primary care physician if you do not have one) for your aftercare needs so that they can reassess your need for medications and monitor your lab values. Today   SUBJECTIVE   Resting   VITAL SIGNS:  Blood pressure (!) 169/73, pulse 85, temperature 97.7 F (36.5 C), temperature source Oral, resp. rate 18, height 6\' 2"  (1.88 m), weight 90 kg (198 lb 6.4 oz), SpO2 99 %.  I/O:   Intake/Output Summary (Last 24 hours) at 11/04/16 1000 Last data filed at 11/04/16 0945  Gross per 24 hour  Intake              240 ml  Output             1800 ml  Net            -1560 ml    PHYSICAL EXAMINATION:  GENERAL:  76 y.o.-year-old patient lying in the bed with no acute distress. Chronically ill EYES: Pupils equal, round, reactive to light and accommodation. No scleral icterus. Extraocular muscles intact.  HEENT: Head atraumatic, normocephalic. Oropharynx and nasopharynx clear.  NECK:  Supple, no jugular venous distention. No thyroid enlargement, no tenderness.  LUNGS: Normal breath sounds bilaterally, no  wheezing, rales,rhonchi or crepitation.  No use of accessory muscles of respiration.  CARDIOVASCULAR: S1, S2 normal. No murmurs, rubs, or gallops.  ABDOMEN: Soft, non-tender, non-distended. Bowel sounds present. No organomegaly or mass. foley EXTREMITIES: ++ pedal edema, cyanosis, or clubbing.  NEUROLOGIC: Cranial nerves II through XII are intact. Muscle strength 5/5 in all extremities. Sensation intact. Gait not checked.  PSYCHIATRIC:  patient is resting comfortably  SKIN: No obvious rash, lesion, or ulcer.   DATA REVIEW:   CBC   Recent Labs Lab 11/01/16 0604  WBC 10.8*  HGB 8.4*  HCT 25.4*  PLT 195    Chemistries   Recent Labs Lab 11/01/16 0604  NA 133*  K 3.8  CL 105  CO2 18*  GLUCOSE 157*  BUN 72*  CREATININE 4.60*  CALCIUM 7.6*    Microbiology Results   Recent Results (from the past 240 hour(s))  Urine culture     Status: Abnormal   Collection Time: 10/25/16 12:36 PM  Result Value Ref Range Status   Specimen Description URINE, RANDOM  Final   Special Requests NONE  Final   Culture (A)  Final    >=  100,000 COLONIES/mL GROUP B STREP(S.AGALACTIAE)ISOLATED TESTING AGAINST S. AGALACTIAE NOT ROUTINELY PERFORMED DUE TO PREDICTABILITY OF AMP/PEN/VAN SUSCEPTIBILITY. Performed at [facility]    Report Status 10/26/2016 FINAL  Final  Blood Culture (routine x 2)     Status: Abnormal   Collection Time: 10/25/16 12:54 PM  Result Value Ref Range Status   Specimen Description BLOOD RIGHT ARM  Final   Special Requests   Final    BOTTLES DRAWN AEROBIC AND ANAEROBIC ANA14ML AER13ML   Culture  Setup Time   Final    GRAM POSITIVE COCCI GRAM NEGATIVE RODS IN BOTH AEROBIC AND ANAEROBIC BOTTLES CONFIRMED BY RWW CRITICAL RESULT CALLED TO, READ BACK BY AND VERIFIED WITH: MATT Gulkana AT [facility] ON 10/26/16 RWW Performed at [facility]    Culture (A)  Final    ESCHERICHIA COLI GROUP B STREP(S.AGALACTIAE)ISOLATED    Report Status 10/29/2016 FINAL  Final    Organism ID, Bacteria ESCHERICHIA COLI  Final   Organism ID, Bacteria GROUP B STREP(S.AGALACTIAE)ISOLATED  Final      Susceptibility   Escherichia coli - MIC*    AMPICILLIN <=2 SENSITIVE Sensitive     CEFAZOLIN <=4 SENSITIVE Sensitive     CEFEPIME <=1 SENSITIVE Sensitive     CEFTAZIDIME <=1 SENSITIVE Sensitive     CEFTRIAXONE <=1 SENSITIVE Sensitive     CIPROFLOXACIN <=0.25 SENSITIVE Sensitive     GENTAMICIN <=1 SENSITIVE Sensitive     IMIPENEM <=0.25 SENSITIVE Sensitive     TRIMETH/SULFA <=20 SENSITIVE Sensitive     AMPICILLIN/SULBACTAM <=2 SENSITIVE Sensitive     PIP/TAZO <=4 SENSITIVE Sensitive     Extended ESBL NEGATIVE Sensitive     * ESCHERICHIA COLI   Group b strep(s.agalactiae)isolated - MIC*    CLINDAMYCIN <=0.25 SENSITIVE Sensitive     AMPICILLIN <=0.25 SENSITIVE Sensitive     ERYTHROMYCIN <=0.12 SENSITIVE Sensitive     VANCOMYCIN 0.5 SENSITIVE Sensitive     CEFTRIAXONE <=0.12 SENSITIVE Sensitive     LEVOFLOXACIN 1 SENSITIVE Sensitive     * GROUP B STREP(S.AGALACTIAE)ISOLATED  Blood Culture (routine x 2)     Status: Abnormal   Collection Time: 10/25/16 12:54 PM  Result Value Ref Range Status   Specimen Description BLOOD LEFT ARM  Final   Special Requests BOTTLES DRAWN AEROBIC AND ANAEROBIC AER7ML ANA8ML  Final   Culture  Setup Time   Final    GRAM NEGATIVE RODS GRAM POSITIVE COCCI IN BOTH AEROBIC AND ANAEROBIC BOTTLES CRITICAL VALUE NOTED.  VALUE IS CONSISTENT WITH PREVIOUSLY REPORTED AND CALLED VALUE. CONFIRMED BY [facility]    Culture (A)  Final    ESCHERICHIA COLI GROUP B STREP(S.AGALACTIAE)ISOLATED SUSCEPTIBILITIES PERFORMED ON PREVIOUS CULTURE WITHIN THE LAST 5 DAYS. Performed at [facility]    Report Status 10/29/2016 FINAL  Final  Blood Culture ID Panel (Reflexed)     Status: Abnormal   Collection Time: 10/25/16 12:54 PM  Result Value Ref Range Status   Enterococcus species NOT DETECTED NOT DETECTED Final   Listeria monocytogenes NOT DETECTED NOT  DETECTED Final   Staphylococcus species NOT DETECTED NOT DETECTED Final   Staphylococcus aureus NOT DETECTED NOT DETECTED Final   Streptococcus species DETECTED (A) NOT DETECTED Final    Comment: CRITICAL RESULT CALLED TO, READ BACK BY AND VERIFIED WITH: MATT MCBANE AT 0159 ON 10/26/16 RWW    Streptococcus agalactiae DETECTED (A) NOT DETECTED Final    Comment: CRITICAL RESULT CALLED TO, READ BACK BY AND VERIFIED WITH: MATT MCBANE AT 0159 ON 10/26/16  RWW    Streptococcus pneumoniae NOT DETECTED NOT DETECTED Final   Streptococcus pyogenes NOT DETECTED NOT DETECTED Final   Acinetobacter baumannii NOT DETECTED NOT DETECTED Final   Enterobacteriaceae species DETECTED (A) NOT DETECTED Final    Comment: CRITICAL RESULT CALLED TO, READ BACK BY AND VERIFIED WITH: MATT MCBANE AT 0159 ON 10/26/16 RWW    Enterobacter cloacae complex NOT DETECTED NOT DETECTED Final   Escherichia coli DETECTED (A) NOT DETECTED Final    Comment: CRITICAL RESULT CALLED TO, READ BACK BY AND VERIFIED WITH: MATT MCBANE AT 0159 ON 10/26/16 RWW    Klebsiella oxytoca NOT DETECTED NOT DETECTED Final   Klebsiella pneumoniae NOT DETECTED NOT DETECTED Final   Proteus species NOT DETECTED NOT DETECTED Final   Serratia marcescens NOT DETECTED NOT DETECTED Final   Carbapenem resistance NOT DETECTED NOT DETECTED Final   Haemophilus influenzae NOT DETECTED NOT DETECTED Final   Neisseria meningitidis NOT DETECTED NOT DETECTED Final   Pseudomonas aeruginosa NOT DETECTED NOT DETECTED Final   Candida albicans NOT DETECTED NOT DETECTED Final   Candida glabrata NOT DETECTED NOT DETECTED Final   Candida krusei NOT DETECTED NOT DETECTED Final   Candida parapsilosis NOT DETECTED NOT DETECTED Final   Candida tropicalis NOT DETECTED NOT DETECTED Final  CULTURE, BLOOD (ROUTINE X 2) w Reflex to ID Panel     Status: None   Collection Time: 10/29/16  5:41 PM  Result Value Ref Range Status   Specimen Description BLOOD  LRFT [facility]  Final    Special Requests   Final    BOTTLES DRAWN AEROBIC AND ANAEROBIC  AER 11 ML ANA 8 ML   Culture NO GROWTH 5 DAYS  Final   Report Status 11/03/2016 FINAL  Final  CULTURE, BLOOD (ROUTINE X 2) w Reflex to ID Panel     Status: None   Collection Time: 10/29/16  5:48 PM  Result Value Ref Range Status   Specimen Description BLOOD RIGHT HAND  Final   Special Requests   Final    BOTTLES DRAWN AEROBIC AND ANAEROBIC  AER 9 ML ANA 9 ML   Culture NO GROWTH 5 DAYS  Final   Report Status 11/03/2016 FINAL  Final    RADIOLOGY:  No results found.   Management plans discussed with the patient, family and they are in agreement.  CODE STATUS:     Code Status Orders        Start     Ordered   10/25/16 1550  Do not attempt resuscitation (DNR)  Continuous    Question Answer Comment  In the event of cardiac or respiratory ARREST Do not call a "code blue"   In the event of cardiac or respiratory ARREST Do not perform Intubation, CPR, defibrillation or ACLS   In the event of cardiac or respiratory ARREST Use medication by any route, position, wound care, and other measures to relive pain and suffering. May use oxygen, suction and manual treatment of airway obstruction as needed for comfort.      10/25/16 1550    Code Status History    Date Active Date Inactive Code Status Order ID Comments User Context   07/21/2015  3:57 PM 07/27/2015  3:54 PM Full Code [id number]  Robert Bellow, MD Inpatient   03/02/2015  7:51 AM 03/03/2015  3:40 PM Full Code [id number]  Robert Bellow, MD Inpatient      TOTAL TIME TAKING CARE OF THIS PATIENT: 40  minutes.    Orvel Cutsforth M.D on  11/04/2016 at 10:00 AM  Between 7am to 6pm - Pager - [phone] After 6pm go to [email or web address] - password [email or web address] [facility]  Office  [phone]  CC: Primary care physician; Albina Billet, MD

## 2016-11-04 NOTE — Discharge Instructions (Signed)
COMFORT CARE

## 2016-11-04 NOTE — Progress Notes (Signed)
Patient seen lying in bed, alert, discussed transfer to the hospice home, patient agreeable. Lunch tray at bedside, patient ate only bites. Denied pain, continued with grimace, again asked about pain, he did agree to PRN pain medication. Per report of staff RN Stanton Kidney, patient did take his OxyContin late this morning. She also reports that it appeared that the pill was intact in the colostomy bag when she changed it. EMS notified for transport. Hospital care team aware. Thank you. Flo Shanks RN, [facility] [place], hospital Liaison [phone] c

## 2016-11-04 NOTE — Progress Notes (Signed)
Patient transferred to [place].  He's A&O.  We gave him a bath before transport and changed the ostomy pouch.  Found that pills - oxycodone and oxybutynin had come through the stoma so patient isn't receiving benefit of meds. Passed this on to hospice liason to share with hospice nurses.  Patient received roxanol .25 ml before transport to EMS.  Hospice liason called report and ordered transport.

## 2016-11-04 NOTE — Clinical Social Work Note (Signed)
Patient to be d/c'ed today to [facility].  Patient and family agreeable to plans will transport via [facility] [place] RN to call report.  Evette Cristal, MSW, [place]

## 2016-12-26 DEATH — deceased
